# Patient Record
Sex: Male | Born: 1935 | Race: White | Hispanic: No | Marital: Married | Smoking: Never smoker
Health system: Southern US, Community
[De-identification: ages and names within clinical notes are randomized; demographics above are authoritative.]

## PROBLEM LIST (undated history)

## (undated) DIAGNOSIS — M4800 Spinal stenosis, site unspecified: Secondary | ICD-10-CM

## (undated) DIAGNOSIS — E119 Type 2 diabetes mellitus without complications: Secondary | ICD-10-CM

## (undated) DIAGNOSIS — E785 Hyperlipidemia, unspecified: Secondary | ICD-10-CM

## (undated) DIAGNOSIS — K649 Unspecified hemorrhoids: Secondary | ICD-10-CM

## (undated) DIAGNOSIS — K635 Polyp of colon: Secondary | ICD-10-CM

## (undated) DIAGNOSIS — I10 Essential (primary) hypertension: Secondary | ICD-10-CM

## (undated) DIAGNOSIS — N400 Enlarged prostate without lower urinary tract symptoms: Secondary | ICD-10-CM

## (undated) HISTORY — DX: Benign prostatic hyperplasia without lower urinary tract symptoms: N40.0

## (undated) HISTORY — DX: Hyperlipidemia, unspecified: E78.5

## (undated) HISTORY — DX: Unspecified hemorrhoids: K64.9

## (undated) HISTORY — DX: Type 2 diabetes mellitus without complications: E11.9

## (undated) HISTORY — DX: Polyp of colon: K63.5

## (undated) HISTORY — DX: Essential (primary) hypertension: I10

## (undated) HISTORY — DX: Spinal stenosis, site unspecified: M48.00

---
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
## 1964-06-25 HISTORY — PX: APPENDECTOMY: SHX54

## 2000-11-20 ENCOUNTER — Encounter (INDEPENDENT_AMBULATORY_CARE_PROVIDER_SITE_OTHER): Payer: Self-pay | Admitting: Specialist

## 2000-11-20 ENCOUNTER — Other Ambulatory Visit: Admission: RE | Admit: 2000-11-20 | Discharge: 2000-11-20 | Payer: Self-pay | Admitting: Internal Medicine

## 2005-04-12 ENCOUNTER — Ambulatory Visit: Payer: Self-pay | Admitting: Internal Medicine

## 2005-04-19 ENCOUNTER — Ambulatory Visit: Payer: Self-pay | Admitting: Internal Medicine

## 2005-05-07 ENCOUNTER — Ambulatory Visit: Payer: Self-pay

## 2005-05-16 ENCOUNTER — Ambulatory Visit: Payer: Self-pay | Admitting: Internal Medicine

## 2005-05-21 ENCOUNTER — Ambulatory Visit: Payer: Self-pay | Admitting: Internal Medicine

## 2005-12-18 ENCOUNTER — Ambulatory Visit: Payer: Self-pay | Admitting: Internal Medicine

## 2006-01-09 ENCOUNTER — Encounter (INDEPENDENT_AMBULATORY_CARE_PROVIDER_SITE_OTHER): Payer: Self-pay | Admitting: Specialist

## 2006-01-09 ENCOUNTER — Ambulatory Visit: Payer: Self-pay | Admitting: Internal Medicine

## 2006-04-10 ENCOUNTER — Ambulatory Visit: Payer: Self-pay | Admitting: Internal Medicine

## 2006-04-23 ENCOUNTER — Ambulatory Visit: Payer: Self-pay | Admitting: Internal Medicine

## 2006-04-23 LAB — CONVERTED CEMR LAB
ALT: 38 U/L
AST: 29 U/L
Albumin: 3.8 g/dL
Alkaline Phosphatase: 45 U/L
BUN: 20 mg/dL
Basophils Absolute: 0.1 K/uL
Basophils Relative: 0.8 %
Bilirubin Urine: NEGATIVE
CO2: 27 meq/L
Calcium: 9 mg/dL
Chloride: 102 meq/L
Chol/HDL Ratio, serum: 4.4
Cholesterol: 151 mg/dL
Creatinine, Ser: 1 mg/dL
Eosinophil percent: 3.2 %
GFR calc non Af Amer: 79 mL/min
Glomerular Filtration Rate, Af Am: 95 mL/min/1.73m2
Glucose, Bld: 227 mg/dL — ABNORMAL HIGH
HCT: 41.1 %
HDL: 34.7 mg/dL — ABNORMAL LOW
Hemoglobin, Urine: NEGATIVE
Hemoglobin: 14.2 g/dL
Ketones, ur: NEGATIVE mg/dL
LDL Cholesterol: 89 mg/dL
Leukocytes, UA: NEGATIVE
Lymphocytes Relative: 40.2 %
MCHC: 34.4 g/dL
MCV: 89 fL
Monocytes Absolute: 0.6 K/uL
Monocytes Relative: 6.3 %
Neutro Abs: 4.7 K/uL
Neutrophils Relative %: 49.5 %
Nitrite: NEGATIVE
PSA: 1.67 ng/mL
Platelets: 205 K/uL
Potassium: 4.1 meq/L
RBC: 4.62 M/uL
RDW: 11.9 %
Sodium: 136 meq/L
Specific Gravity, Urine: 1.025
TSH: 2.98 u[IU]/mL
Total Bilirubin: 0.7 mg/dL
Total Protein, Urine: NEGATIVE mg/dL
Total Protein: 7.1 g/dL
Triglyceride fasting, serum: 137 mg/dL
Urine Glucose: NEGATIVE mg/dL
Urobilinogen, UA: 0.2
VLDL: 27 mg/dL
WBC: 9.5 10*3/microliter
pH: 5

## 2006-04-30 ENCOUNTER — Ambulatory Visit: Payer: Self-pay | Admitting: Internal Medicine

## 2006-05-29 ENCOUNTER — Ambulatory Visit: Payer: Self-pay | Admitting: Internal Medicine

## 2006-06-20 ENCOUNTER — Ambulatory Visit: Payer: Self-pay | Admitting: Internal Medicine

## 2007-03-22 ENCOUNTER — Encounter: Payer: Self-pay | Admitting: *Deleted

## 2007-03-22 DIAGNOSIS — E119 Type 2 diabetes mellitus without complications: Secondary | ICD-10-CM

## 2007-03-22 DIAGNOSIS — E1159 Type 2 diabetes mellitus with other circulatory complications: Secondary | ICD-10-CM

## 2007-03-22 DIAGNOSIS — E785 Hyperlipidemia, unspecified: Secondary | ICD-10-CM

## 2007-03-22 DIAGNOSIS — Z87898 Personal history of other specified conditions: Secondary | ICD-10-CM

## 2007-03-22 DIAGNOSIS — I10 Essential (primary) hypertension: Secondary | ICD-10-CM

## 2007-04-30 ENCOUNTER — Ambulatory Visit: Payer: Self-pay | Admitting: Internal Medicine

## 2007-05-03 LAB — CONVERTED CEMR LAB
ALT: 22 units/L (ref 0–53)
Alkaline Phosphatase: 39 units/L (ref 39–117)
BUN: 17 mg/dL (ref 6–23)
Basophils Absolute: 0 10*3/uL (ref 0.0–0.1)
Basophils Relative: 0.3 % (ref 0.0–1.0)
Bilirubin Urine: NEGATIVE
Bilirubin, Direct: 0.1 mg/dL (ref 0.0–0.3)
CO2: 30 meq/L (ref 19–32)
Calcium: 9.1 mg/dL (ref 8.4–10.5)
Eosinophils Absolute: 0.3 10*3/uL (ref 0.0–0.6)
GFR calc Af Amer: 107 mL/min
GFR calc non Af Amer: 89 mL/min
HDL: 35.2 mg/dL — ABNORMAL LOW (ref >39.0)
Hemoglobin, Urine: NEGATIVE
Hemoglobin: 14 g/dL (ref 13.0–17.0)
Leukocytes, UA: NEGATIVE
Lymphocytes Relative: 30.1 % (ref 12.0–46.0)
MCHC: 34.2 g/dL (ref 30.0–36.0)
MCV: 90.2 fL (ref 78.0–100.0)
Monocytes Absolute: 0.5 10*3/uL (ref 0.2–0.7)
Monocytes Relative: 5.5 % (ref 3.0–11.0)
Neutro Abs: 6 10*3/uL (ref 1.4–7.7)
Platelets: 205 10*3/uL (ref 150–400)
Potassium: 4.6 meq/L (ref 3.5–5.1)
TSH: 2.42 microintl units/mL (ref 0.35–5.50)
Total CHOL/HDL Ratio: 3.5
Total Protein, Urine: NEGATIVE mg/dL
Total Protein: 6.9 g/dL (ref 6.0–8.3)
Triglycerides: 88 mg/dL (ref 0–149)
Urine Glucose: NEGATIVE mg/dL

## 2007-05-07 ENCOUNTER — Ambulatory Visit: Payer: Self-pay | Admitting: Internal Medicine

## 2007-05-08 LAB — CONVERTED CEMR LAB: Hgb A1c MFr Bld: 6.2 % — ABNORMAL HIGH (ref 4.6–6.0)

## 2008-06-29 ENCOUNTER — Ambulatory Visit: Payer: Self-pay | Admitting: Internal Medicine

## 2008-06-29 LAB — CONVERTED CEMR LAB
Albumin: 3.9 g/dL (ref 3.5–5.2)
Alkaline Phosphatase: 46 units/L (ref 39–117)
BUN: 16 mg/dL (ref 6–23)
Basophils Relative: 0.5 % (ref 0.0–3.0)
Calcium: 8.9 mg/dL (ref 8.4–10.5)
Cholesterol: 163 mg/dL (ref 0–200)
Creatinine, Ser: 1 mg/dL (ref 0.4–1.5)
Eosinophils Relative: 2.1 % (ref 0.0–5.0)
GFR calc Af Amer: 94 mL/min
Glucose, Bld: 121 mg/dL — ABNORMAL HIGH (ref 70–99)
HCT: 44 % (ref 39.0–52.0)
HDL: 41.2 mg/dL (ref >39.0)
Hemoglobin: 15.3 g/dL (ref 13.0–17.0)
MCV: 88.3 fL (ref 78.0–100.0)
Monocytes Absolute: 1.1 10*3/uL — ABNORMAL HIGH (ref 0.1–1.0)
Monocytes Relative: 7.6 % (ref 3.0–12.0)
Neutro Abs: 9.5 10*3/uL — ABNORMAL HIGH (ref 1.4–7.7)
Nitrite: NEGATIVE
PSA: 2.02 ng/mL (ref 0.10–4.00)
TSH: 4.07 microintl units/mL (ref 0.35–5.50)
Total Protein, Urine: NEGATIVE mg/dL
Total Protein: 7 g/dL (ref 6.0–8.3)
Urine Glucose: NEGATIVE mg/dL
WBC: 15 10*3/uL — ABNORMAL HIGH (ref 4.5–10.5)
pH: 6 (ref 5.0–8.0)

## 2008-07-01 ENCOUNTER — Encounter: Payer: Self-pay | Admitting: Internal Medicine

## 2008-07-06 ENCOUNTER — Ambulatory Visit: Payer: Self-pay | Admitting: Internal Medicine

## 2008-07-06 LAB — CONVERTED CEMR LAB: Hgb A1c MFr Bld: 6.5 % — ABNORMAL HIGH (ref 4.6–6.0)

## 2008-07-07 ENCOUNTER — Telehealth: Payer: Self-pay | Admitting: Internal Medicine

## 2009-07-04 ENCOUNTER — Ambulatory Visit: Payer: Self-pay | Admitting: Internal Medicine

## 2009-07-04 LAB — CONVERTED CEMR LAB
ALT: 22 units/L (ref 0–53)
AST: 22 units/L (ref 0–37)
Albumin: 3.9 g/dL (ref 3.5–5.2)
Alkaline Phosphatase: 34 units/L — ABNORMAL LOW (ref 39–117)
BUN: 15 mg/dL (ref 6–23)
Basophils Relative: 0.1 % (ref 0.0–3.0)
Bilirubin Urine: NEGATIVE
Chloride: 105 meq/L (ref 96–112)
Cholesterol: 109 mg/dL (ref 0–200)
Creatinine, Ser: 1.1 mg/dL (ref 0.4–1.5)
Hemoglobin, Urine: NEGATIVE
Hemoglobin: 14.5 g/dL (ref 13.0–17.0)
LDL Cholesterol: 52 mg/dL (ref 0–99)
Lymphocytes Relative: 37.5 % (ref 12.0–46.0)
MCHC: 33 g/dL (ref 30.0–36.0)
Monocytes Relative: 6.3 % (ref 3.0–12.0)
Neutro Abs: 5.7 10*3/uL (ref 1.4–7.7)
RBC: 4.83 M/uL (ref 4.22–5.81)
Total Protein, Urine: NEGATIVE mg/dL
Total Protein: 7.3 g/dL (ref 6.0–8.3)
Urine Glucose: NEGATIVE mg/dL

## 2009-07-08 ENCOUNTER — Encounter: Payer: Self-pay | Admitting: Internal Medicine

## 2009-07-11 ENCOUNTER — Ambulatory Visit: Payer: Self-pay | Admitting: Internal Medicine

## 2009-07-13 ENCOUNTER — Telehealth: Payer: Self-pay | Admitting: Internal Medicine

## 2009-07-21 ENCOUNTER — Encounter: Payer: Self-pay | Admitting: Internal Medicine

## 2009-07-21 ENCOUNTER — Telehealth: Payer: Self-pay | Admitting: Internal Medicine

## 2009-08-01 ENCOUNTER — Telehealth: Payer: Self-pay | Admitting: Internal Medicine

## 2009-08-04 ENCOUNTER — Encounter: Payer: Self-pay | Admitting: Internal Medicine

## 2009-11-29 ENCOUNTER — Telehealth: Payer: Self-pay | Admitting: Internal Medicine

## 2010-06-21 ENCOUNTER — Telehealth: Payer: Self-pay | Admitting: Internal Medicine

## 2010-07-12 ENCOUNTER — Encounter: Payer: Self-pay | Admitting: Internal Medicine

## 2010-07-19 ENCOUNTER — Ambulatory Visit: Admit: 2010-07-19 | Payer: Self-pay | Admitting: Internal Medicine

## 2010-07-25 NOTE — Letter (Signed)
Summary: [facility]   Imported By: Lester Monetta 07/13/2009 07:46:30  _____________________________________________________________________  External Attachment:    Type:   Image     Comment:   External Document

## 2010-07-25 NOTE — Progress Notes (Signed)
  Phone Note Refill Request Message from:  Fax from Pharmacy on November 29, 2009 4:38 PM  Refills Requested: Medication #1:  AMLODIPINE BESYLATE 10 MG TABS 1 by mouth once daily. Initial call taken by: Ami Bullins CMA,  November 29, 2009 4:38 PM    Prescriptions: AMLODIPINE BESYLATE 10 MG TABS (AMLODIPINE BESYLATE) 1 by mouth once daily  #30 x 6   Entered by:   Ami Bullins CMA   Authorized by:   Jacques Navy MD   Signed by:   Bill Salinas CMA on 11/29/2009   Method used:   Electronically to        [facility] (retail)       [address]       [place], [place]  [address]       Ph: [phone]       Fax: [phone]   RxID:   [id number]

## 2010-07-25 NOTE — Progress Notes (Signed)
Summary: RX's not rec'd  Phone Note Call from Patient Call back at Home Phone [phone]   Caller: Spouse Summary of Call: Patient spouse called stating that prescriptions have not been rec'd by [facility]. Re-sent them.  Patient notified. Initial call taken by: Lucious Groves,  July 13, 2009 3:10 PM    Prescriptions: GLIMEPIRIDE 2 MG  TABS (GLIMEPIRIDE) Take 1/2 tablet daily  #90 x 3   Entered by:   Lucious Groves   Authorized by:   Jacques Navy MD   Signed by:   Lucious Groves on 07/13/2009   Method used:   Faxed to ...       [facility]       [address] [place], [place]  [address]       Ph: [phone]       Fax: [phone]   RxID:   [id number] FUROSEMIDE 40 MG TABS (FUROSEMIDE) 1 by mouth once daily  #90 x 3   Entered by:   Lucious Groves   Authorized by:   Jacques Navy MD   Signed by:   Lucious Groves on 07/13/2009   Method used:   Faxed to ...       [facility] (mail[facility]order)       [address] [place], [place]  [address]       Ph: [phone]       Fax: [phone]   RxID:   [id number] LISINOPRIL 40 MG  TABS (LISINOPRIL) Take one tablet once daily  #90 x 3   Entered by:   Lucious Groves   Authorized by:   Jacques Navy MD   Signed by:   Lucious Groves on 07/13/2009   Method used:   Faxed to ...       [facility] (mail-order)       [address] [place], [place]  [address]       Ph: [phone]       Fax: [phone]   RxID:   [id number] METFORMIN HCL 1000 MG  TABS (METFORMIN HCL) Take one tablet twice daily  #180 x 3   Entered by:   Lucious Groves   Authorized by:   Jacques Navy MD   Signed by:   Lucious Groves on 07/13/2009   Method used:   Faxed to ...       [facility] (mail-order)       [address] [place], [place]  [address]       Ph: [phone]       Fax: [phone]   RxID:   [id number] SIMVASTATIN 20 MG  TABS (SIMVASTATIN) 1 by mouth once daily  #90 x 3   Entered by:   Lucious Groves   Authorized by:    Jacques Navy MD   Signed by:   Lucious Groves on 07/13/2009   Method used:   Faxed to ...       [facility] (mail[facility]order)       [address] [place], [place]  [address]       Ph: [phone]       Fax: [phone]   RxID:   [id number]

## 2010-07-25 NOTE — Letter (Signed)
Summary: BP/[facility]  [facility]   Imported By: Lester [place] 08/04/2009 10:53:46  _____________________________________________________________________  External Attachment:    Type:   Image     Comment:   External Document

## 2010-07-25 NOTE — Letter (Signed)
Summary: BP Log from pt/[facility]  BP Log from pt/[facility]   Imported By: Lester DeWitt 07/13/2009 07:47:30  _____________________________________________________________________  External Attachment:    Type:   Image     Comment:   External Document

## 2010-07-25 NOTE — Assessment & Plan Note (Signed)
Summary: PHYSICAL--PER WIFE--AETNA-D/T----STC--rs'd/cd   Vital Signs:  Patient profile:   77 year old male Height:      65 inches Weight:      187 pounds BMI:     31.23 O2 Sat:      95 % on Room air Temp:     97.4 degrees F oral Pulse rate:   101 / minute BP sitting:   172 / 92  (left arm) Cuff size:   large  Vitals Entered By: Bill Salinas CMA (July 11, 2009 2:21 PM)  O2 Flow:  Room air CC: pt here for cpx/ ab   Primary Care Provider:  Jacques Navy MD  CC:  pt here for cpx/ ab.  History of Present Illness: Patient presents for general medical follow-up and exam. He is feeling well.   He has been monitoring his BP at home and much to his surprise the BP is elvated in the 150-190 SBP/ 77-90 DBP range. He has had no symptoms. He has had good control of CBGs at home. He has had an eye exam with no diabetic retinopathy.   He reports good control of his diabetes via CBG monitoring at home. He does adhere to a good diet.   He is recovering from a cold he contracted from one of the grandchildren.   Current Medications (verified): 1)  Multivitamins   Tabs (Multiple Vitamin) .... Take One Tablet Once Daily 2)  B-50   Tabs (Vitamins-Lipotropics) .... Take One Tablet One Daily 3)  Vitamin E 400 Unit  Caps (Vitamin E) .... Take One Capsule Daily 4)  Vitamin C 500 Mg  Tabs (Ascorbic Acid) .... Take One Tablet Once Daily 5)  Simvastatin 20 Mg  Tabs (Simvastatin) .Marland Kitchen.. 1 By Mouth Once Daily 6)  Metformin Hcl 1000 Mg  Tabs (Metformin Hcl) .... Take One Tablet Twice Daily 7)  Lisinopril 40 Mg  Tabs (Lisinopril) .... Take One Tablet Once Daily 8)  Furosemide 40 Mg Tabs (Furosemide) .Marland Kitchen.. 1 By Mouth Once Daily 9)  Glimepiride 2 Mg  Tabs (Glimepiride) .... Take 1/2 Tablet Daily 10)  Vitamin D 1000 Unit  Tabs (Cholecalciferol) .Marland Kitchen.. 1 By Mouth Two Times A Day  Allergies (verified): No Known Drug Allergies  Past History:  Past Medical History: Last updated: 03/22/2007 Hx of POLYP,  COLON (ICD-211.3) HYPERTENSION (ICD-401.9) DYSPEPSIA,MILD (ICD-536.8) DIABETES MELLITUS (ICD-250.00) HYPERLIPIDEMIA (ICD-272.4) HEMORRHOIDS (ICD-455.6) BENIGN PROSTATIC HYPERTROPHY, HX OF (ICD-V13.8)     Past Surgical History: Last updated: 03/22/2007 * THORACOSTOMY AS AN INFANT APPENDECTOMY, HX OF (ICD-V45.79)    Family History: Last updated: 05/07/2007 PVD, DM, CHF, CAD brother died at age 51 in his sleep of MI  Social History: Last updated: 05/07/2007 married 1966 2 sons 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 supportive family-sons live nearby marriage is in good health  Risk Factors: Exercise: yes (05/07/2007)  Risk Factors: Smoking Status: quit (03/22/2007)  Review of Systems  The patient denies anorexia, fever, weight loss, weight gain, decreased hearing, hoarseness, chest pain, syncope, dyspnea on exertion, prolonged cough, headaches, abdominal pain, severe indigestion/heartburn, genital sores, muscle weakness, transient blindness, difficulty walking, unusual weight change, enlarged lymph nodes, and angioedema.    Physical Exam  General:  WNWD [place] male in no distress Head:  normocephalic, atraumatic, and no abnormalities observed.   Eyes:  vision grossly intact, pupils equal, pupils round, corneas and lenses clear, and no injection.   Ears:  R ear normal and L ear normal.   Nose:  no external  deformity and no external erythema.   Mouth:  Oral mucosa and oropharynx without lesions or exudates.  Teeth in good repair. Neck:  supple, full ROM, no thyromegaly, and no carotid bruits.   Chest Wall:  right posterior chest wall deformity from thoracotomy Lungs:  Normal respiratory effort, chest expands symmetrically. Lungs are clear to auscultation, no crackles or wheezes. Heart:  Normal rate and regular rhythm. S1 and S2 normal without gallop, murmur, click, rub or other extra sounds. Abdomen:  soft, non-tender, normal bowel sounds, no  distention, no masses, no abdominal hernia, and no hepatomegaly.   Rectal:  No external abnormalities noted. Normal sphincter tone. No rectal masses or tenderness. Prostate:  Prostate gland firm and smooth, no enlargement, nodularity, tenderness, mass, asymmetry or induration. Msk:  normal ROM, no joint tenderness, no joint swelling, no joint warmth, and no redness over joints.   Pulses:  2+ radial and DP pulses Extremities:  No clubbing, cyanosis, edema, or deformity noted with normal full range of motion of all joints.   Neurologic:  No cranial nerve deficits noted. Station and gait are normal. Plantar reflexes are down-going bilaterally. DTRs are symmetrical throughout. Sensory, motor and coordinative functions appear intact. Skin:  Vitiligo. No suspicious lesions noted Cervical Nodes:  no anterior cervical adenopathy and no posterior cervical adenopathy.   Inguinal Nodes:  no R inguinal adenopathy and no L inguinal adenopathy.   Psych:  Oriented X3, memory intact for recent and remote, normally interactive, and good eye contact.    Diabetes Management Exam:    Foot Exam (with socks and/or shoes not present):       Sensory-Pinprick/Light touch:          Right medial foot (L-4): normal          Right dorsal foot (L-5): normal          Right lateral foot (S-1): normal       Sensory-other: normal deep vibratory sensation    Eye Exam:       Eye Exam done elsewhere          Date: 07/08/2009          Results: normal          Done by: [facility]   Impression & Recommendations:  Problem # 1:  HYPERTENSION (ICD-401.9)  His updated medication list for this problem includes:    Lisinopril 40 Mg Tabs (Lisinopril) .Marland Kitchen... Take one tablet once daily    Furosemide 40 Mg Tabs (Furosemide) .Marland Kitchen... 1 by mouth once daily  BP today: 172/92 Prior BP: 180/80 (07/06/2008) Suboptimal control base on todays readings and home. Presumed white coat hypertension is true hypertension not  controlled.  Plan increase furosemide to full dose at 40mg  daily.         If not controlled will add CCB  Problem # 2:  DIABETES MELLITUS (ICD-250.00)  His updated medication list for this problem includes:    Metformin Hcl 1000 Mg Tabs (Metformin hcl) .Marland Kitchen... Take one tablet twice daily    Lisinopril 40 Mg Tabs (Lisinopril) .Marland Kitchen... Take one tablet once daily    Glimepiride 2 Mg Tabs (Glimepiride) .Marland Kitchen... Take 1/2 tablet daily  Labs Reviewed: Creat: 1.1 (07/04/2009)    Reviewed HgBA1c results: 6.6 (07/04/2009)  6.5 (07/06/2008)  Good control. Plan is to continue present medications.  Problem # 3:  HYPERLIPIDEMIA (ICD-272.4)  His updated medication list for this problem includes:    Simvastatin 20 Mg Tabs (Simvastatin) .Marland Kitchen... 1 by mouth  once daily  Labs Reviewed: SGOT: 22 (07/04/2009)   SGPT: 22 (07/04/2009)   HDL:40.00 (07/04/2009), 41.2 (06/29/2008)  LDL:52 (07/04/2009), 95 (06/29/2008)  Chol:109 (07/04/2009), 163 (06/29/2008)  Trig:87.0 (07/04/2009), 133 (06/29/2008)  Excellent control and definitely at goal.  Plan - continue present medications.  Problem # 4:  Preventive Health Care (ICD-V70.0) Normal exam. He is current with his eye doctor - report brought today. He is current with colorectal cancer screening. Immuniations are current. Normal prostate cancer screen with PSA within normal limits by absolute value and acceration.  In summary - a very nice man who is medically stable except for suboptimal control of BP. He will send me BP readings in 2 weeks and further adjustments in medications will be made as needed.   Complete Medication List: 1)  Multivitamins Tabs (Multiple vitamin) .... Take one tablet once daily 2)  B-50 Tabs (Vitamins-lipotropics) .... Take one tablet one daily 3)  Vitamin E 400 Unit Caps (Vitamin e) .... Take one capsule daily 4)  Vitamin C 500 Mg Tabs (Ascorbic acid) .... Take one tablet once daily 5)  Simvastatin 20 Mg Tabs (Simvastatin) .Marland Kitchen.. 1 by  mouth once daily 6)  Metformin Hcl 1000 Mg Tabs (Metformin hcl) .... Take one tablet twice daily 7)  Lisinopril 40 Mg Tabs (Lisinopril) .... Take one tablet once daily 8)  Furosemide 40 Mg Tabs (Furosemide) .Marland Kitchen.. 1 by mouth once daily 9)  Glimepiride 2 Mg Tabs (Glimepiride) .... Take 1/2 tablet daily 10)  Vitamin D 1000 Unit Tabs (Cholecalciferol) .Marland Kitchen.. 1 by mouth two times a day  Patient: Brandon Santos Note: All result statuses are Final unless otherwise noted.  Tests: (1) BMP (METABOL)   Sodium                    142 mEq/L                   135-145   Potassium                 4.8 mEq/L                   3.5-5.1   Chloride                  105 mEq/L                   96-112   Carbon Dioxide            30 mEq/L                    19-32   Glucose                   97 mg/dL                    52-84   BUN                       15 mg/dL                    1-32   Creatinine                1.1 mg/dL                   4.4-0.1   Calcium                   9.2  mg/dL                   4.0-98.1   GFR                       69.72 mL/min                >60  Tests: (2) Lipid Panel (LIPID)   Cholesterol               109 mg/dL                   1-914     ATP III Classification            Desirable:  < 200 mg/dL                    Borderline High:  200 - 239 mg/dL               High:  > = 240 mg/dL   Triglycerides             87.0 mg/dL                  7.8-295.6     Normal:  <150 mg/dL     Borderline High:  213 - 199 mg/dL   HDL                       08.65 mg/dL                 >78.46   VLDL Cholesterol          17.4 mg/dL                  9.6-29.5   LDL Cholesterol           52 mg/dL                    2-84  CHO/HDL Ratio:  CHD Risk                             3                    Men          Women     1/2 Average Risk     3.4          3.3     Average Risk          5.0          4.4     2X Average Risk          9.6          7.1     3X Average Risk          15.0          11.0                            Tests: (3) CBC Platelet w/Diff (CBCD)   White Cell Count     [H]  10.8 K/uL                   4.5-10.5   Red Cell Count            4.83 Mil/uL  4.22-5.81   Hemoglobin                14.5 g/dL                   29.5-62.1   Hematocrit                43.9 %                      39.0-52.0   MCV                       90.9 fl                     78.0-100.0   MCHC                      33.0 g/dL                   30.8-65.7   RDW                       11.9 %                      11.5-14.6   Platelet Count            204.0 K/uL                  150.0-400.0   Neutrophil %              53.4 %                      43.0-77.0   Lymphocyte %              37.5 %                      12.0-46.0   Monocyte %                6.3 %                       3.0-12.0   Eosinophils%              2.7 %                       0.0-5.0   Basophils %               0.1 %                       0.0-3.0   Neutrophill Absolute      5.7 K/uL                    1.4-7.7   Lymphocyte Absolute  [H]  4.1 K/uL                    0.7-4.0   Monocyte Absolute         0.7 K/uL                    0.1-1.0  Eosinophils, Absolute                             0.3 K/uL  0.0-0.7   Basophils Absolute        0.0 K/uL                    0.0-0.1  Tests: (4) Hepatic/Liver Function Panel (HEPATIC)   Total Bilirubin           0.8 mg/dL                   5.7-8.4   Direct Bilirubin          0.2 mg/dL                   6.9-6.2   Alkaline Phosphatase [L]  34 U/L                      39-117   AST                       22 U/L                      0-37   ALT                       22 U/L                      0-53   Total Protein             7.3 g/dL                    9.5-2.8   Albumin                   3.9 g/dL                    4.1-3.2  Tests: (5) TSH (TSH)   FastTSH                   3.39 uIU/mL                 0.35-5.50  Tests: (6) Prostate Specific Antigen (PSA)   PSA-Hyb                   2.45 ng/mL                   0.10-4.00  Tests: (7) UDip Only (UDIP)   Color                     DK. YELLOW       RANGE:  Yellow;Lt. Yellow   Clarity                   CLEAR                       Clear   Specific Gravity          1.020                       1.000 - 1.030   Urine Ph                  5.0                         5.0-8.0   Protein  NEGATIVE                    Negative   Urine Glucose             NEGATIVE                    Negative   Ketones                   NEGATIVE                    Negative   Urine Bilirubin           NEGATIVE                    Negative   Blood                     NEGATIVE                    Negative   Urobilinogen              0.2                         0.0 - 1.0   Leukocyte Esterace        NEGATIVE                    Negative   Nitrite                   NEGATIVE                    Negative  Tests: (1) Hemoglobin A1C (A1C)   Hemoglobin A1C       [H]  6.6 %                       4.6-6.5  Not Administered:    Influenza Vaccine not given due to: declined

## 2010-07-25 NOTE — Progress Notes (Signed)
  Phone Note Call from Patient   Summary of Call: received e-mail with BP readings: still running high up to 176/89. Called pt. He is to add norvasc. [facility] Initial call taken by: Jacques Navy MD,  July 21, 2009 9:40 AM    New/Updated Medications: AMLODIPINE BESYLATE 5 MG TABS (AMLODIPINE BESYLATE) 1 by mouth once daily Prescriptions: AMLODIPINE BESYLATE 5 MG TABS (AMLODIPINE BESYLATE) 1 by mouth once daily  #30 x 1   Entered and Authorized by:   Jacques Navy MD   Signed by:   Jacques Navy MD on 07/21/2009   Method used:   Electronically to        [facility] (retail)       [address]       [place], [place]  [address]       Ph: [phone]       Fax: [phone]   RxID:   [id number]

## 2010-07-25 NOTE — Progress Notes (Signed)
Summary: BP READINGS  Phone Note Call from Patient   Summary of Call: Pt emailed bp reading, please advise.  Initial call taken by: Lamar Sprinkles, CMA,  August 01, 2009 11:02 AM  Follow-up for Phone Call        reviewed BP readings e-mailed to me: systolics running a little high.  Plan - increase amlodipine to 10mg  daily. May call in new Rx Follow-up by: Jacques Navy MD,  August 03, 2009 9:12 AM  Additional Follow-up for Phone Call Additional follow up Details #1::        Notified. RX sent per patient request. Additional Follow-up by: Lucious Groves,  August 03, 2009 11:09 AM    New/Updated Medications: AMLODIPINE BESYLATE 10 MG TABS (AMLODIPINE BESYLATE) 1 by mouth once daily Prescriptions: AMLODIPINE BESYLATE 10 MG TABS (AMLODIPINE BESYLATE) 1 by mouth once daily  #30 x 3   Entered by:   Lucious Groves   Authorized by:   Jacques Navy MD   Signed by:   Lucious Groves on 08/03/2009   Method used:   Electronically to        [facility] (retail)       [address]       [place], [place]  [address]       Ph: [phone]       Fax: [phone]   RxID:   [id number]

## 2010-07-25 NOTE — Progress Notes (Signed)
Summary: BP  Phone Note Call from Patient   Summary of Call: Pt emailed Dr Debby Bud with his recent BP readings and would like feedback from Dr.  Initial call taken by: Lamar Sprinkles, CMA,  July 21, 2009 8:12 AM  Follow-up for Phone Call        done Follow-up by: Jacques Navy MD,  July 21, 2009 10:07 AM

## 2010-07-25 NOTE — Letter (Signed)
Summary: BPs from pt  BPs from pt   Imported By: Lester [place] 07/25/2009 09:24:37  _____________________________________________________________________  External Attachment:    Type:   Image     Comment:   External Document

## 2010-07-26 ENCOUNTER — Other Ambulatory Visit: Payer: Self-pay | Admitting: Internal Medicine

## 2010-07-26 ENCOUNTER — Other Ambulatory Visit: Payer: Self-pay

## 2010-07-26 ENCOUNTER — Encounter (INDEPENDENT_AMBULATORY_CARE_PROVIDER_SITE_OTHER): Payer: Self-pay | Admitting: *Deleted

## 2010-07-26 DIAGNOSIS — E119 Type 2 diabetes mellitus without complications: Secondary | ICD-10-CM

## 2010-07-26 DIAGNOSIS — Z0000 Encounter for general adult medical examination without abnormal findings: Secondary | ICD-10-CM

## 2010-07-26 LAB — CBC WITH DIFFERENTIAL/PLATELET
Basophils Absolute: 0.1 10*3/uL (ref 0.0–0.1)
Basophils Relative: 0.5 % (ref 0.0–3.0)
Eosinophils Absolute: 0.4 10*3/uL (ref 0.0–0.7)
Eosinophils Relative: 3.7 % (ref 0.0–5.0)
Lymphs Abs: 4.2 10*3/uL — ABNORMAL HIGH (ref 0.7–4.0)
MCV: 91.1 fl (ref 78.0–100.0)
Neutro Abs: 6.4 10*3/uL (ref 1.4–7.7)
Neutrophils Relative %: 54.9 % (ref 43.0–77.0)
Platelets: 194 10*3/uL (ref 150.0–400.0)
WBC: 11.6 10*3/uL — ABNORMAL HIGH (ref 4.5–10.5)

## 2010-07-26 LAB — URINALYSIS
Bilirubin Urine: NEGATIVE
Hgb urine dipstick: NEGATIVE
Leukocytes, UA: NEGATIVE
Specific Gravity, Urine: 1.02 (ref 1.000–1.030)
Urine Glucose: NEGATIVE
Urobilinogen, UA: 0.2 (ref 0.0–1.0)

## 2010-07-26 LAB — BASIC METABOLIC PANEL
BUN: 20 mg/dL (ref 6–23)
Calcium: 9.6 mg/dL (ref 8.4–10.5)
Chloride: 105 mEq/L (ref 96–112)
Creatinine, Ser: 1.1 mg/dL (ref 0.4–1.5)
GFR: 71.77 mL/min (ref >60.00)

## 2010-07-26 LAB — HEPATIC FUNCTION PANEL
AST: 19 U/L (ref 0–37)
Albumin: 4.1 g/dL (ref 3.5–5.2)
Alkaline Phosphatase: 44 U/L (ref 39–117)
Total Protein: 7.1 g/dL (ref 6.0–8.3)

## 2010-07-26 LAB — LIPID PANEL
Cholesterol: 121 mg/dL (ref 0–200)
HDL: 39.5 mg/dL (ref >39.00)
LDL Cholesterol: 56 mg/dL (ref 0–99)
Triglycerides: 126 mg/dL (ref 0.0–149.0)
VLDL: 25.2 mg/dL (ref 0.0–40.0)

## 2010-07-26 LAB — TSH: TSH: 3.67 u[IU]/mL (ref 0.35–5.50)

## 2010-07-27 NOTE — Progress Notes (Signed)
Summary: Rx refill req  Phone Note Refill Request Message from:  Patient on June 21, 2010 10:37 AM  Refills Requested: Medication #1:  SIMVASTATIN 20 MG  TABS 1 by mouth once daily   Dosage confirmed as above?Dosage Confirmed  Medication #2:  METFORMIN HCL 1000 MG  TABS Take one tablet twice daily   Dosage confirmed as above?Dosage Confirmed  Medication #3:  LISINOPRIL 40 MG  TABS Take one tablet once daily   Dosage confirmed as above?Dosage Confirmed  Medication #4:  FUROSEMIDE 40 MG TABS 1 by mouth once daily   Dosage confirmed as above?Dosage Confirmed And Glimeparide 2mg    Method Requested: Fax to [facility] Initial call taken by: Margaret Pyle, CMA,  June 21, 2010 10:38 AM    Prescriptions: GLIMEPIRIDE 2 MG  TABS (GLIMEPIRIDE) Take 1/2 tablet daily  #90 x 0   Entered by:   Margaret Pyle, CMA   Authorized by:   Jacques Navy MD   Signed by:   Margaret Pyle, CMA on 06/21/2010   Method used:   Faxed to ...       [facility] ([facility]       [address] [place], [place]  [address]       Ph: [phone]       Fax: [phone]   RxID:   [id number] FUROSEMIDE 40 MG TABS (FUROSEMIDE) 1 by mouth once daily  #90 x 0   Entered by:   Margaret Pyle, CMA   Authorized by:   Jacques Navy MD   Signed by:   Margaret Pyle, CMA on 06/21/2010   Method used:   Faxed to ...       [facility] (mail-order)       [address] [place], [place]  [address]       Ph: [phone]       Fax: [phone]   RxID:   [id number] LISINOPRIL 40 MG  TABS (LISINOPRIL) Take one tablet once daily  #90 x 0   Entered by:   Margaret Pyle, CMA   Authorized by:   Jacques Navy MD   Signed by:   Margaret Pyle, CMA on 06/21/2010   Method used:   Faxed to ...       [facility] (mail-order)       [address] [place], [place]  [address]       Ph: [phone]       Fax: [phone]   RxID:    [id number] METFORMIN HCL 1000 MG  TABS (METFORMIN HCL) Take one tablet twice daily  #180 x 0   Entered by:   Margaret Pyle, CMA   Authorized by:   Jacques Navy MD   Signed by:   Margaret Pyle, CMA on 06/21/2010   Method used:   Faxed to ...       [facility] (mail-order)       [address] [place], [place]  [address]       Ph: [phone]       Fax: [phone]   RxID:   [id number] SIMVASTATIN 20 MG  TABS (SIMVASTATIN) 1 by mouth once daily  #90 x 0   Entered by:   Margaret Pyle, CMA   Authorized by:   Jacques Navy MD   Signed by:   Margaret Pyle, CMA on 06/21/2010  Method used:   Faxed to ...       [facility] (mail-order)       [address] [place], [place]  [address]       Ph: [phone]       Fax: [phone]   RxID:   [id number]

## 2010-08-01 ENCOUNTER — Encounter (INDEPENDENT_AMBULATORY_CARE_PROVIDER_SITE_OTHER): Payer: Managed Care, Other (non HMO) | Admitting: Internal Medicine

## 2010-08-01 ENCOUNTER — Encounter: Payer: Self-pay | Admitting: Internal Medicine

## 2010-08-01 DIAGNOSIS — I10 Essential (primary) hypertension: Secondary | ICD-10-CM

## 2010-08-01 DIAGNOSIS — Z0000 Encounter for general adult medical examination without abnormal findings: Secondary | ICD-10-CM

## 2010-08-01 DIAGNOSIS — Z23 Encounter for immunization: Secondary | ICD-10-CM

## 2010-08-01 DIAGNOSIS — E785 Hyperlipidemia, unspecified: Secondary | ICD-10-CM

## 2010-08-01 DIAGNOSIS — E119 Type 2 diabetes mellitus without complications: Secondary | ICD-10-CM

## 2010-08-10 NOTE — Assessment & Plan Note (Signed)
Summary: cpx aetna stc   Vital Signs:  Patient profile:   77 year old male Height:      65 inches Weight:      183 pounds BMI:     30.56 O2 Sat:      99 % on Room air Temp:     98.0 degrees F oral Pulse rate:   84 / minute BP sitting:   138 / 66  (left arm) Cuff size:   large  Vitals Entered By: Bill Salinas CMA (August 01, 2010 10:17 AM)  O2 Flow:  Room air CC: cpx/ab  Vision Screening:      Vision Comments: 07/12/2010 eye exam   Primary Care Provider:  Jacques Navy MD  CC:  cpx/ab.  History of Present Illness: Brandon Santos presents for an annual wellness exam. He has had a good year without illness, injury or surgery., He is feeling well. He has no problems with any of his medications.   In he is 100% independent in all ADLs. He has had no falls and has no fall. He is cognitively intact: learning to play the piano, he is reading a lot, he manages all the household finances and is very interactive. No signs of symptoms of depression.   He reports that with walking he will develop hip pain bilaterally that resolves within 20 seconds of rest. He has no pain on the bike or at rest or at other times. He is able to walk 100+ yards without having to rest.   Preventive Screening-Counseling & Management  Alcohol-Tobacco     Alcohol drinks/day: <1     Alcohol type: wine     Smoking Status: quit     Year Quit: 1986     Pack years: 22 pk years  Caffeine-Diet-Exercise     Caffeine use/day: none     Diet Comments: heart healthy -vegetarian     Does Patient Exercise: yes     Type of exercise: stationary bike     Exercise (avg: min/session): <30     Times/week: 6  Hep-HIV-STD-Contraception     Hepatitis Risk: no risk noted     HIV Risk: no risk noted     STD Risk: no risk noted     Dental Visit-last 6 months yes     TSE monthly: no     Sun Exposure-Excessive: no  Safety-Violence-Falls     Seat Belt Use: yes     Helmet Use: n/a     Firearms in the Home: no  firearms in the home     Smoke Detectors: yes     Violence in the Home: no risk noted     Sexual Abuse: no     Fall Risk: none      Sexual History:  currently monogamous.        Drug Use:  never.        Blood Transfusions:  no.    Current Medications (verified): 1)  Multivitamins   Tabs (Multiple Vitamin) .... Take One Tablet Once Daily 2)  B-50   Tabs (Vitamins-Lipotropics) .... Take One Tablet One Daily 3)  Vitamin E 400 Unit  Caps (Vitamin E) .... Take One Capsule Daily 4)  Vitamin C 500 Mg  Tabs (Ascorbic Acid) .... Take One Tablet Once Daily 5)  Simvastatin 20 Mg  Tabs (Simvastatin) .Marland Kitchen.. 1 By Mouth Once Daily 6)  Metformin Hcl 1000 Mg  Tabs (Metformin Hcl) .... Take One Tablet Twice Daily 7)  Lisinopril 40  Mg  Tabs (Lisinopril) .... Take One Tablet Once Daily 8)  Furosemide 40 Mg Tabs (Furosemide) .Marland Kitchen.. 1 By Mouth Once Daily 9)  Glimepiride 2 Mg  Tabs (Glimepiride) .... Take 1/2 Tablet Daily 10)  Vitamin D 1000 Unit  Tabs (Cholecalciferol) .Marland Kitchen.. 1 Once Daily 11)  Amlodipine Besylate 10 Mg Tabs (Amlodipine Besylate) .Marland Kitchen.. 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 supportive family-sons live nearby Music and literature lover: reads a lot , is learning the keyboard. marriage is in good health- SO with diagnosis of breast cancer '11 - she is doing well.Caffeine use/day:  none Hepatitis Risk:  no risk noted HIV Risk:  no risk noted STD Risk:  no risk noted Dental Care w/in 6 mos.:  yes Sun Exposure-Excessive:  no Seat Belt Use:  yes Fall Risk:  none Sexual History:  currently monogamous Drug Use:  never Blood Transfusions:  no  Review of Systems  The patient denies anorexia, fever, weight loss, weight gain, vision loss, decreased hearing, chest pain, syncope, dyspnea on exertion, peripheral edema, headaches, hemoptysis, abdominal pain, severe  indigestion/heartburn, hematuria, incontinence, muscle weakness, suspicious skin lesions, transient blindness, difficulty walking, depression, abnormal bleeding, enlarged lymph nodes, angioedema, and testicular masses.    Physical Exam  General:  alert, well-developed, well-nourished, well-hydrated, and normal appearance.   Head:  Normocephalic and atraumatic without obvious abnormalities. No apparent alopecia or balding. Eyes:  No corneal or conjunctival inflammation noted. EOMI. Perrla. Funduscopic exam benign, without hemorrhages, exudates or papilledema. Vision grossly normal. Ears:  External ear exam shows no significant lesions or deformities.  Otoscopic examination reveals clear canals, tympanic membranes are intact bilaterally without bulging, retraction, inflammation or discharge. Hearing is grossly normal bilaterally. Nose:  no external deformity and no external erythema.   Mouth:  Oral mucosa and oropharynx without lesions or exudates.  Teeth in good repair. Neck:  supple, full ROM, no thyromegaly, and no carotid bruits.   Chest Wall:  No deformities, masses, tenderness or gynecomastia noted. Lungs:  Normal respiratory effort, chest expands symmetrically. Lungs are clear to auscultation, no crackles or wheezes. Heart:  normal rate, regular rhythm with frequent premature beats, no murmur, no JVD, and no HJR.   Abdomen:  soft, non-tender, normal bowel sounds, no guarding, no rigidity, and no inguinal hernia.   Rectal:  deferred Prostate:  deferred to age Msk:  normal ROM, no joint tenderness, no joint swelling, no joint warmth, no redness over joints, and no joint deformities.   Pulses:  2+ radial, posterior tibial and dorsalis pedis pulses Extremities:  No clubbing, cyanosis, edema, or deformity noted with normal full range of motion of all joints.   Neurologic:  cranial nerves II-XII intact, gait normal, and DTRs symmetrical and normal.   Skin:  vitaligo - fairly extensive, no lesions  or ulcerations, no suspicious moles. Cervical Nodes:  no anterior cervical adenopathy and no posterior cervical adenopathy.   Psych:  Oriented X3, normally interactive, good eye contact, and not anxious appearing.     Impression & Recommendations:  Problem # 1:  HYPERTENSION (ICD-401.9)  His updated medication list for this problem includes:    Lisinopril 40 Mg Tabs (Lisinopril) .Marland Kitchen... Take one tablet once daily    Furosemide 40 Mg Tabs (Furosemide) .Marland Kitchen... 1 by mouth once daily    Amlodipine Besylate 10 Mg Tabs (Amlodipine besylate) .Marland Kitchen... 1 by mouth once daily  BP today:  138/66 Prior BP: 172/92 (07/11/2009)  Labs Reviewed: K+: 5.3 (07/26/2010) Creat: : 1.1 (07/26/2010)  Good control on present medications. Renal function and electrolytes are normal  Plan - continue present regimen  Problem # 2:  DIABETES MELLITUS (ICD-250.00) His updated medication list for this problem includes:    Metformin Hcl 1000 Mg Tabs (Metformin hcl) .Marland Kitchen... Take one tablet twice daily    Lisinopril 40 Mg Tabs (Lisinopril) .Marland Kitchen... Take one tablet once daily    Glimepiride 2 Mg Tabs (Glimepiride) .Marland Kitchen... Take 1/2 tablet daily  Labs Reviewed: Creat: 1.1 (07/26/2010)     Last Eye Exam: normal (07/08/2009) Reviewed HgBA1c results: 6.3 (07/26/2010)  6.6 (07/04/2009)  Excellent A1C reflecting good control on present regimen of diet plus medications. Current with eye exam.  Plan - continue present regimen   Problem # 3:  HYPERLIPIDEMIA (ICD-272.4)  His updated medication list for this problem includes:    Simvastatin 20 Mg Tabs (Simvastatin) .Marland Kitchen... 1 by mouth once daily  Labs Reviewed: SGOT: 19 (07/26/2010)   SGPT: 20 (07/26/2010)   HDL:39.50 (07/26/2010), 40.00 (07/04/2009)  LDL:56 (07/26/2010), 52 (07/04/2009)  Chol:121 (07/26/2010), 109 (07/04/2009)  Trig:126.0 (07/26/2010), 87.0 (07/04/2009)  Excellent control on low dose simvastatin  Plan - continue the same  Problem # 4:  BENIGN PROSTATIC  HYPERTROPHY, HX OF (ICD-V13.8) Asymptomatic.  Problem # 5:  Preventive Health Care (ICD-V70.0) Interval history is benign. Physical exam is normal. Lab results are excellent. He is current with colorectal cancer screening with last study Juy '07. Last PSA Jan '11 2.45 - discussed recent USPHCTF recommendations - and with recent normal lab and no symptoms will not screen this year. Immunizations: tetnus today; Pneumonia vaccine Nov '08; shingles recommended and he will check insurance coverage. 12 Lead EKG with right bundle branch block (explained in detail with cartoon) and no signs of ischemia or injury.  In summary - a delightful gentleman who is medically stable and doing well. He will return as needed or in 1 year.  Complete Medication List: 1)  Multivitamins Tabs (Multiple vitamin) .... Take one tablet once daily 2)  B-50 Tabs (Vitamins-lipotropics) .... Take one tablet one daily 3)  Vitamin E 400 Unit Caps (Vitamin e) .... Take one capsule daily 4)  Vitamin C 500 Mg Tabs (Ascorbic acid) .... Take one tablet once daily 5)  Simvastatin 20 Mg Tabs (Simvastatin) .Marland Kitchen.. 1 by mouth once daily 6)  Metformin Hcl 1000 Mg Tabs (Metformin hcl) .... Take one tablet twice daily 7)  Lisinopril 40 Mg Tabs (Lisinopril) .... Take one tablet once daily 8)  Furosemide 40 Mg Tabs (Furosemide) .Marland Kitchen.. 1 by mouth once daily 9)  Glimepiride 2 Mg Tabs (Glimepiride) .... Take 1/2 tablet daily 10)  Vitamin D 1000 Unit Tabs (Cholecalciferol) .Marland Kitchen.. 1 once daily 11)  Amlodipine Besylate 10 Mg Tabs (Amlodipine besylate) .Marland Kitchen.. 1 by mouth once daily  Other Orders: Tdap => 24yrs IM (16109) Admin 1st Vaccine (60454) Medicare -1st Annual Wellness Visit [phone])  Patient: Brandon Santos Note: All result statuses are Final unless otherwise noted.  Tests: (1) Lipid Panel (LIPID)   Cholesterol               121 mg/dL                   9-147     ATP III Classification            Desirable:  < 200 mg/dL  Borderline High:  200 - 239 mg/dL               High:  > = 240 mg/dL   Triglycerides             126.0 mg/dL                 3.0-865.7     Normal:  <150 mg/dL     Borderline High:  846 - 199 mg/dL   HDL                       96.29 mg/dL                 >52.84   VLDL Cholesterol          25.2 mg/dL                  1.3-24.4   LDL Cholesterol           56 mg/dL                    0-10  CHO/HDL Ratio:  CHD Risk                             3                    Men          Women     1/2 Average Risk     3.4          3.3     Average Risk          5.0          4.4     2X Average Risk          9.6          7.1     3X Average Risk          15.0          11.0                           Tests: (2) BMP (METABOL)   Sodium                    141 mEq/L                   135-145   Potassium            [H]  5.3 mEq/L                   3.5-5.1   Chloride                  105 mEq/L                   96-112   Carbon Dioxide            29 mEq/L                    19-32   Glucose                   90 mg/dL                    27-25   BUN  20 mg/dL                    1-61   Creatinine                1.1 mg/dL                   0.9-6.0   Calcium                   9.6 mg/dL                   4.5-40.9   GFR                       71.77 mL/min                >60.00  Tests: (3) CBC Platelet w/Diff (CBCD)   White Cell Count     [H]  11.6 K/uL                   4.5-10.5   Red Cell Count            4.41 Mil/uL                 4.22-5.81   Hemoglobin                13.5 g/dL                   81.1-91.4   Hematocrit                40.2 %                      39.0-52.0   MCV                       91.1 fl                     78.0-100.0   MCHC                      33.7 g/dL                   78.2-95.6   RDW                       13.7 %                      11.5-14.6   Platelet Count            194.0 K/uL                  150.0-400.0   Neutrophil %              54.9 %                       43.0-77.0   Lymphocyte %              35.8 %                      12.0-46.0   Monocyte %                5.1 %  3.0-12.0   Eosinophils%              3.7 %                       0.0-5.0   Basophils %               0.5 %                       0.0-3.0   Neutrophill Absolute      6.4 K/uL                    1.4-7.7   Lymphocyte Absolute  [H]  4.2 K/uL                    0.7-4.0   Monocyte Absolute         0.6 K/uL                    0.1-1.0  Eosinophils, Absolute                             0.4 K/uL                    0.0-0.7   Basophils Absolute        0.1 K/uL                    0.0-0.1  Tests: (4) Hepatic/Liver Function Panel (HEPATIC)   Total Bilirubin           0.5 mg/dL                   0.2-7.2   Direct Bilirubin          0.1 mg/dL                   5.3-6.6   Alkaline Phosphatase      44 U/L                      39-117   AST                       19 U/L                      0-37   ALT                       20 U/L                      0-53   Total Protein             7.1 g/dL                    4.4-0.3   Albumin                   4.1 g/dL                    4.7-4.2  Tests: (5) TSH (TSH)   FastTSH                   3.67 uIU/mL                 0.35-5.50  Tests: (  6) UDip Only (UDIP)   Color                     YELLOW       RANGE:  Yellow;Lt. Yellow   Clarity                   CLEAR                       Clear   Specific Gravity          1.020                       1.000 - 1.030   Urine Ph                  5.5                         5.0-8.0   Protein                   NEGATIVE                    Negative   Urine Glucose             NEGATIVE                    Negative   Ketones                   TRACE                       Negative   Urine Bilirubin           NEGATIVE                    Negative   Blood                     NEGATIVE                    Negative   Urobilinogen              0.2                         0.0 - 1.0   Leukocyte Esterace        NEGATIVE                     Negative   Nitrite                   NEGATIVE                    Negative  Tests: (1) Hemoglobin A1C (A1C)   Hemoglobin A1C            6.3 %                       4.6-6.5  Orders Added: 1)  Tdap => 2yrs IM [90715] 2)  Admin 1st Vaccine [90471] 3)  Medicare -1st Annual Wellness Visit [G0438] 4)  Est. Patient Level III [16109]   Immunizations Administered:  Tetanus Vaccine:    Vaccine Type: Tdap    Site: right deltoid    Mfr: GlaxoSmithKline    Dose: 0.5 ml  Route: IM    Given by: Ami Bullins CMA    Exp. Date: 04/13/2012    Lot #: [id number]    VIS given: 05/12/08 version given August 01, 2010.   Immunizations Administered:  Tetanus Vaccine:    Vaccine Type: Tdap    Site: right deltoid    Mfr: GlaxoSmithKline    Dose: 0.5 ml    Route: IM    Given by: Ami Bullins CMA    Exp. Date: 04/13/2012    Lot #: [id number]    VIS given: 05/12/08 version given August 01, 2010.

## 2010-08-16 NOTE — Letter (Signed)
Summary: [facility]   Imported By: Lennie Odor 08/09/2010 14:56:39  _____________________________________________________________________  External Attachment:    Type:   Image     Comment:   External Document

## 2010-08-17 ENCOUNTER — Encounter: Payer: Self-pay | Admitting: Internal Medicine

## 2010-08-17 ENCOUNTER — Ambulatory Visit (INDEPENDENT_AMBULATORY_CARE_PROVIDER_SITE_OTHER): Payer: Managed Care, Other (non HMO)

## 2010-08-17 ENCOUNTER — Ambulatory Visit: Payer: Managed Care, Other (non HMO)

## 2010-08-17 DIAGNOSIS — Z23 Encounter for immunization: Secondary | ICD-10-CM

## 2010-08-17 DIAGNOSIS — Z2911 Encounter for prophylactic immunotherapy for respiratory syncytial virus (RSV): Secondary | ICD-10-CM

## 2010-08-22 NOTE — Assessment & Plan Note (Signed)
Summary: shingles shot per ami/lb  Nurse Visit   Allergies: No Known Drug Allergies  Immunizations Administered:  Zostavax # 1:    Vaccine Type: Zostavax    Site: Right arm    Mfr: Merck    Dose: 0.65    Route: IM    Given by: Lamar Sprinkles, CMA    Exp. Date: 02/10/2011    Lot #: [id number]    VIS given: 04/06/05 given August 18, 2010.  Orders Added: 1)  Zoster (Shingles) Vaccine Live [90736] 2)  Admin 1st Vaccine [id number]

## 2010-09-18 ENCOUNTER — Telehealth: Payer: Self-pay | Admitting: Internal Medicine

## 2010-09-18 NOTE — Telephone Encounter (Signed)
Pt's wife called stating that Pt is in need of mail order refills to [facility] for: Amlodipine, Furosemide, Lisinopril, Metformin, Glimepiride, and Simvastatin

## 2010-09-18 NOTE — Telephone Encounter (Signed)
May have refills on all meds for the year.

## 2010-09-19 MED ORDER — LISINOPRIL 40 MG PO TABS: 40.0000 mg | ORAL_TABLET | Freq: Every day | ORAL | Status: DC

## 2010-09-19 MED ORDER — AMLODIPINE BESYLATE 10 MG PO TABS: 10.0000 mg | ORAL_TABLET | Freq: Every day | ORAL | Status: DC

## 2010-09-19 MED ORDER — FUROSEMIDE 40 MG PO TABS: 40.0000 mg | ORAL_TABLET | Freq: Every day | ORAL | Status: DC

## 2010-09-19 MED ORDER — METFORMIN HCL ER (MOD) 1000 MG PO TB24: 1000.0000 mg | ORAL_TABLET | Freq: Two times a day (BID) | ORAL | Status: DC

## 2010-09-19 MED ORDER — GLIMEPIRIDE 2 MG PO TABS: 1.0000 mg | ORAL_TABLET | ORAL | Status: DC

## 2010-09-19 MED ORDER — SIMVASTATIN 20 MG PO TABS: 20.0000 mg | ORAL_TABLET | Freq: Every day | ORAL | Status: DC

## 2010-09-19 NOTE — Telephone Encounter (Signed)
Pts wife is aware 

## 2010-09-20 ENCOUNTER — Telehealth: Payer: Self-pay | Admitting: *Deleted

## 2010-09-20 NOTE — Telephone Encounter (Signed)
Duplicate request

## 2010-09-26 ENCOUNTER — Other Ambulatory Visit: Payer: Self-pay | Admitting: Internal Medicine

## 2010-09-26 ENCOUNTER — Telehealth: Payer: Self-pay | Admitting: Internal Medicine

## 2010-09-26 MED ORDER — METFORMIN HCL 1000 MG PO TABS: 1000.0000 mg | ORAL_TABLET | Freq: Two times a day (BID) | ORAL | Status: DC

## 2010-09-26 MED ORDER — AMLODIPINE BESYLATE 10 MG PO TABS: 10.0000 mg | ORAL_TABLET | Freq: Every day | ORAL | Status: DC

## 2010-09-26 NOTE — Telephone Encounter (Signed)
Received Rx error from pharmacy: Called [facility] and gave VO to pharmacist/John

## 2010-09-26 NOTE — Progress Notes (Signed)
  Subjective:    Patient ID: Brandon Santos, male    DOB: 1934/04/07, 77 y.o.   MRN: [id number]  HPI    Review of Systems     Objective:   Physical Exam        Assessment & Plan:

## 2010-09-26 NOTE — Telephone Encounter (Signed)
Called [facility] and corrected error on Metformin order & Amlodipine [#90] Pt informed.

## 2010-09-26 NOTE — Progress Notes (Signed)
refill 

## 2010-10-11 ENCOUNTER — Telehealth: Payer: Self-pay | Admitting: *Deleted

## 2010-10-11 NOTE — Telephone Encounter (Signed)
Research side effects: gingival hyperplasia is listed as occuring in less than 1% of patients. I have a call in to Dr. Margaretha Seeds to ask if this is really a major concern.

## 2010-10-11 NOTE — Telephone Encounter (Signed)
Pt was seen by periodontist and has some gum disease. He was concerned about pt's BP med, amlodipine. The MD was going to send Dr Debby Bud a note regarding this. Please advise if you have seen this b/c pt is concerned.

## 2010-10-12 NOTE — Telephone Encounter (Signed)
Spoke with Dr. Margaretha Seeds - this is a problem that is not uncommon in his practice.  Plan - stop amlodipine           Start bystolic 5mg  once a day (samples if we have them)           BP check at 1 week - he can call in readings.

## 2010-10-18 ENCOUNTER — Other Ambulatory Visit: Payer: Self-pay | Admitting: *Deleted

## 2010-10-18 NOTE — Telephone Encounter (Signed)
1. Is his BP controlled on bystolic 5mg ? Need some readings please 2. Will substitute generic metoprolol - need to know 1 to dose 2

## 2010-10-18 NOTE — Telephone Encounter (Signed)
See previous phone note - Pt would like gen alt to bystolic due to cost.

## 2010-10-18 NOTE — Telephone Encounter (Signed)
Pt does not want to start bystolic (and has not started), he wants generic alt to amlodipine which he has to stop due to gum disease.

## 2010-10-18 NOTE — Telephone Encounter (Signed)
Left detailed vm for wife on her cell, samples (bystolic 10mg  take 1/2 tab once daily)

## 2010-10-19 MED ORDER — METOPROLOL TARTRATE 25 MG PO TABS: 25.0000 mg | ORAL_TABLET | Freq: Two times a day (BID) | ORAL | Status: DC

## 2010-10-19 NOTE — Telephone Encounter (Signed)
Entered med but did not sign - will need local pharmacy while trying out the drug

## 2010-10-19 NOTE — Telephone Encounter (Signed)
Patient informed. 

## 2010-10-19 NOTE — Telephone Encounter (Signed)
Sorry, I don't see dose and frequency.

## 2010-10-19 NOTE — Telephone Encounter (Signed)
The bystolic was to be a free way (samples) to see if beta -blocker would control his BP. He is the boss - order metoprolol as previously stated.

## 2010-10-23 ENCOUNTER — Telehealth: Payer: Self-pay | Admitting: *Deleted

## 2010-10-23 NOTE — Telephone Encounter (Signed)
Wife wants to double check that MD is ok w/pt taking metoprolol. Pharm med insert states to alert your MD if you have some specific conditions, one being diabetes. Pt is diabetic, is this med ok?

## 2010-10-24 NOTE — Telephone Encounter (Signed)
Yes this medication is ok to take as a diabetic

## 2010-10-24 NOTE — Telephone Encounter (Signed)
Patient notified per MD.

## 2010-11-06 ENCOUNTER — Other Ambulatory Visit: Payer: Self-pay | Admitting: *Deleted

## 2010-11-06 MED ORDER — METOPROLOL TARTRATE 25 MG PO TABS: 25.0000 mg | ORAL_TABLET | Freq: Two times a day (BID) | ORAL | Status: DC

## 2010-11-10 NOTE — Assessment & Plan Note (Signed)
[facility]                           PRIMARY CARE OFFICE NOTE   SOTIRIOS, NAVARRO                         MRN:          [id number]  DATE:05/29/2006                            DOB:          1933/11/18    Mr. Loch presents today for removal of a suspicious keratotic type  lesion on his back.   The patient is also for follow up of diabetes and hypertension.   PROCEDURE:  Shave biopsy of abnormal mole. Signed consent obtained.   PREP:  The patient was prepped with alcohol, anesthetized with 2%  Xylocaine and re prepped with Betadine.   DESCRIPTION OF PROCEDURE:  Using a dermablade a shaved biopsy is  preformed with specimen off the table to pathology. Resulting wound is  hyfrecated at a setting of 10 to control all bleeding, for fulguration  of base. The patient tolerated this procedure well.   VITAL SIGNS: Temperature was 99.2, blood pressure 185/92, pulse 97,  weight 192.   ASSESSMENT/PLAN:  1. Dermaplasia with suspicious multicolored irregular keratotic      lesions sent to pathology for analysis. Patient will be notified      when report is returned.  2. Diabetes. The patient reports that his capillary blood glucoses are      running 150 to 200. His present regimen of metformin 500 mg b.i.d.      The patient is to increase his metformin to 1000 mg b.i.d. then      continue to monitor blood sugars. Prescriptions provided for      Glucometer strips. The patient is to present data to me in 1 month.  3. Hypertension. The patient's blood pressure continues to be poorly      controlled with systolics running in the 150s at home, diastolics      in the 90s. On regimen of Diovan HCT 160/25. The patient to      discontinue Diovan after completing his present supply. Will switch      him to lisinopril 40 mg daily  and Lasix 20 mg daily. The patient      is to maintain a record of his blood pressures and we will adjust      his medications as needed  to obtain control.   The patient will provide me with information in 1 month. We will make  appropriate adjustments.     Rosalyn Gess Norins, MD  Electronically Signed    MEN/MedQ  DD: 05/30/2006  DT: 05/30/2006  Job #: [id number]

## 2010-11-10 NOTE — Assessment & Plan Note (Signed)
[facility]                             PRIMARY CARE OFFICE NOTE   Brandon Santos, Brandon Santos                         MRN:          [id number]  DATE:04/30/2006                            DOB:          10-24-1933    Brandon Santos is a very delightful 77 year old gentleman who presents for  followup evaluation and exam.  He was last seen in the office May 21, 2005 for follow up and review of a negative stress test.  His last full  physical exam was April 19, 2005.  Please see that complete dictation.   INTERVAL HISTORY:  He reports that he has been feeling well and doing well  with no new medical problems.   PAST MEDICAL HISTORY:   SURGICAL:  1. Appendectomy, remote.  2. Thoracostomy as an infant for pneumonia.   MEDICAL:  1. Usual childhood diseases.  2. Hyperplastic colon polyps.  3. Benign prostatic hypertrophy.  4. Hemorrhoids.  5. Hyperlipidemia.  6. Diet managed diabetes.  7. Mild dyspepsia.   FAMILY HISTORY:  Positive for peripheral vascular disease, diabetes and  heart failure.   SOCIAL HISTORY:  Patient is married 41 years.  He has 2 sons, age 74 and 34.  He has 1 grandchild and 1 grandchild on the way.  Patient has been retired  for 5 years. He enjoys his retirement reading, exercising on a regular  basis.   CURRENT MEDICATIONS:  1. Diovan HCT 160/25 one daily.  2. Crestor 5 mg daily.  3. Centrum multivitamin.  4. B-50.  5. Vitamin E.  6. Vitamin C.   CHART REVIEW:  Patient's last colonoscopy dated from January 08, 2006 where he  had a tubular adenomatous polyp.  He would be for follow up in 5 years.  Patient has recently seen Dr. Ashley Royalty April 18, 2006 with an unremarkable  examination.  Please see the attached, including __________  dye study.   REVIEW OF SYSTEMS:  Is negative for any constitutional, cardiovascular,  respiratory, GI or GU problems.   EXAMINATION:  Temperature is 98.1.  Blood pressure 188/91.  Pulse  84.  Weight 193.  GENERAL APPEARANCE:  This is a well-nourished, well-developed gentleman in  no acute distress.  HEENT:  Normocephalic, atraumatic.  EACs with TMs were unremarkable.  Oropharynx was negative.  Dentition in good repair.  No buccal or palate  lesions were noted.  Posterior pharynx was clear.  Conjunctivae was clear.  Sclerae was clear.  Pupils equal, round and reactive to light.  Funduscopic  exam is deferred to Ophthalmology.  NECK:  Was supple without splenomegaly.  No lymphadenopathy was noted in the  cervical supraclavicular regions.  CHEST:  No CVA tenderness.  LUNGS:  Were clear to auscultation and percussion.  CARDIOVASCULAR:  2+ radial pulses.  No JVD or carotid bruits.  He had a  quiet precordium with a regular rate and rhythm without murmurs, rubs or  gallops.  ABDOMEN:  Is soft.  No guarding or rebound.  No splenomegaly was noted.  RECTAL:  Revealed normal sphincter tone.  Prostate was smooth, normal size  and contour without nodules.  Generous at 3+.  EXTREMITIES:  No clubbing, cyanosis, edema or deformities were noted.  NEUROLOGIC:  Nonfocal.  DERMATOLOGY:  Patient is noted to have what appears to be a keratotic lesion  on his right upper back which appears larger than last years examination.  There are multi-variegated colored parts of this lesion with an irregular  border.   LABORATORY:  Hemoglobin was 14.2 grams, white count was 9,500 with a normal  differential.  Chemistries were normal.  Serum glucose elevated at 227.  Kidney function normal with a creatinine of 1.0 and a GFR of 79 milliliters  per minute.  Thyroid function normal with a TSH of 2.98.  PSA was normal at  1.67.  Urinalysis was negative.  Cholesterol 151, triglycerides 137, HDL was  34.7, LDL was 89.   ASSESSMENT AND PLAN:  1. Diabetes.  Patient's last hemoglobin A1C  was 6.8% and prior to that      6.2%.  A1C not available today.  Serum glucose is certainly elevated at      227 in a  fasting state.  Patient reports his home CBG monitoring which      shows his blood sugars consistently running between 140 and 190.  At      this point I feel that diet management failed.  Plan:  Patient is to      start on metformin 500 mg b.i.d.  He is to return in 1 month for      followup.  2. Lipids.  Patient is tolerating Crestor well.  He is at goal with an LDL      of less than 100.  HDL is mildly depressed but counter balanced by his      low LDL.  Plan: Patient to continue on his present medications.  3. Hypertension.  Patient has white coat hypertension.  His blood      pressures at home are much better controlled by his report running in      the 130s over 70s.  Plan:  Patient to continue his present medications.  4. Cardiovascular.  Patient with a negative stress study as noted.  He has      no symptoms.  5. Dermatology.  Patient with a changing mole.  I have suggested excision.      Plan:  Patient to return in 1 month for shaved biopsy of a suspicious      lesion.  6. Health maintenance.  Patient is current with colorectal cancer      screening.  PSA is normal.   SUMMARY:  A very pleasant gentleman now with need for medical management of  his diabetes with metformin started as noted.    ______________________________  Rosalyn Gess Norins, MD    MEN/MedQ  DD: 05/01/2006  DT: 05/01/2006  Job #: [id number]   cc:   Christen Bame

## 2010-11-23 ENCOUNTER — Telehealth: Payer: Self-pay | Admitting: *Deleted

## 2010-11-23 MED ORDER — METOPROLOL TARTRATE 25 MG PO TABS: 25.0000 mg | ORAL_TABLET | Freq: Two times a day (BID) | ORAL | Status: DC

## 2010-11-23 NOTE — Telephone Encounter (Signed)
Patient requesting RF of metoprolol to go to [facility] - 90 day supply 3 rfs OK? (Pt needs a call when complete)

## 2010-11-23 NOTE — Telephone Encounter (Signed)
Pt advised via VM 

## 2010-11-23 NOTE — Telephone Encounter (Signed)
Ok for 90 day Rx with 3 refills

## 2011-06-04 ENCOUNTER — Ambulatory Visit (INDEPENDENT_AMBULATORY_CARE_PROVIDER_SITE_OTHER): Payer: Managed Care, Other (non HMO) | Admitting: *Deleted

## 2011-06-04 DIAGNOSIS — Z23 Encounter for immunization: Secondary | ICD-10-CM

## 2011-06-26 HISTORY — PX: COLONOSCOPY: SHX174

## 2011-07-18 ENCOUNTER — Ambulatory Visit (INDEPENDENT_AMBULATORY_CARE_PROVIDER_SITE_OTHER): Payer: Managed Care, Other (non HMO) | Admitting: Ophthalmology

## 2011-07-18 DIAGNOSIS — E1165 Type 2 diabetes mellitus with hyperglycemia: Secondary | ICD-10-CM

## 2011-07-18 DIAGNOSIS — H43819 Vitreous degeneration, unspecified eye: Secondary | ICD-10-CM

## 2011-07-18 DIAGNOSIS — H2510 Age-related nuclear cataract, unspecified eye: Secondary | ICD-10-CM

## 2011-07-18 DIAGNOSIS — E11319 Type 2 diabetes mellitus with unspecified diabetic retinopathy without macular edema: Secondary | ICD-10-CM

## 2011-08-08 ENCOUNTER — Encounter: Payer: Managed Care, Other (non HMO) | Admitting: Internal Medicine

## 2011-08-08 ENCOUNTER — Ambulatory Visit (INDEPENDENT_AMBULATORY_CARE_PROVIDER_SITE_OTHER): Payer: Managed Care, Other (non HMO) | Admitting: Internal Medicine

## 2011-08-08 ENCOUNTER — Encounter: Payer: Self-pay | Admitting: Internal Medicine

## 2011-08-08 ENCOUNTER — Other Ambulatory Visit (INDEPENDENT_AMBULATORY_CARE_PROVIDER_SITE_OTHER): Payer: Managed Care, Other (non HMO)

## 2011-08-08 DIAGNOSIS — E119 Type 2 diabetes mellitus without complications: Secondary | ICD-10-CM

## 2011-08-08 DIAGNOSIS — E785 Hyperlipidemia, unspecified: Secondary | ICD-10-CM

## 2011-08-08 DIAGNOSIS — I10 Essential (primary) hypertension: Secondary | ICD-10-CM

## 2011-08-08 DIAGNOSIS — Z0000 Encounter for general adult medical examination without abnormal findings: Secondary | ICD-10-CM

## 2011-08-08 LAB — LIPID PANEL
Cholesterol: 125 mg/dL (ref 0–200)
LDL Cholesterol: 58 mg/dL (ref 0–99)
Total CHOL/HDL Ratio: 3
Triglycerides: 101 mg/dL (ref 0.0–149.0)
VLDL: 20.2 mg/dL (ref 0.0–40.0)

## 2011-08-08 LAB — COMPREHENSIVE METABOLIC PANEL
ALT: 24 U/L (ref 0–53)
AST: 24 U/L (ref 0–37)
Alkaline Phosphatase: 42 U/L (ref 39–117)
BUN: 20 mg/dL (ref 6–23)
Creatinine, Ser: 1.1 mg/dL (ref 0.4–1.5)
Total Bilirubin: 0.9 mg/dL (ref 0.3–1.2)

## 2011-08-08 LAB — HEPATIC FUNCTION PANEL
ALT: 24 U/L (ref 0–53)
AST: 24 U/L (ref 0–37)
Total Bilirubin: 0.9 mg/dL (ref 0.3–1.2)

## 2011-08-08 LAB — HEMOGLOBIN A1C: Hgb A1c MFr Bld: 6.2 % (ref 4.6–6.5)

## 2011-08-08 MED ORDER — SIMVASTATIN 20 MG PO TABS: 20.0000 mg | ORAL_TABLET | Freq: Every day | ORAL | Status: DC

## 2011-08-08 MED ORDER — GLIMEPIRIDE 2 MG PO TABS: 1.0000 mg | ORAL_TABLET | ORAL | Status: DC

## 2011-08-08 MED ORDER — FUROSEMIDE 40 MG PO TABS: 40.0000 mg | ORAL_TABLET | Freq: Every day | ORAL | Status: DC

## 2011-08-08 MED ORDER — METOPROLOL TARTRATE 25 MG PO TABS: 25.0000 mg | ORAL_TABLET | Freq: Two times a day (BID) | ORAL | Status: DC

## 2011-08-08 MED ORDER — METFORMIN HCL 1000 MG PO TABS: 1000.0000 mg | ORAL_TABLET | Freq: Two times a day (BID) | ORAL | Status: DC

## 2011-08-08 MED ORDER — LISINOPRIL 40 MG PO TABS: 40.0000 mg | ORAL_TABLET | Freq: Every day | ORAL | Status: DC

## 2011-08-08 NOTE — Progress Notes (Signed)
Subjective:    Patient ID: Brandon Santos, male    DOB: [birth year], 78 y.o.   MRN: [id number]  HPI The patient is here for annual Medicare wellness examination and management of other chronic and acute problems. Interval history unremarkable.    The risk factors are reflected in the social history.  The roster of all physicians providing medical care to patient - is listed in the Snapshot section of the chart.  Activities of daily living:  The patient is 100% inedpendent in all ADLs: dressing, toileting, feeding as well as independent mobility  Home safety : The patient has smoke detectors in the home. Fall - house is fall safe.  They wear seatbelts. No firearms at home   There is no risks for hepatitis, STDs or HIV. There is no  history of blood transfusion. They have no travel history to infectious disease endemic areas of the world.  The patient has seen their dentist in the last six month. They have seen their eye doctor in the last year. They deny any hearing difficulty and have not had audiologic testing in the last year.  They do not  have excessive sun exposure. Discussed the need for sun protection: hats, long sleeves and use of sunscreen if there is significant sun exposure.   Diet: the importance of a healthy diet is discussed. They do have a healthy  diet.  The patient has a regular exercise program: spin-bike , 20 min duration, 4 per week.  The benefits of regular aerobic exercise were discussed.  Depression screen: there are no signs or vegative symptoms of depression- irritability, change in appetite, anhedonia, sadness/tearfullness.  Cognitive assessment: the patient manages all their financial and personal affairs and is actively engaged.   The following portions of the patient's history were reviewed and updated as appropriate: allergies, current medications, past family history, past medical history,  past surgical history, past social history  and problem  list.  Vision, hearing, body mass index were assessed and reviewed.   During the course of the visit the patient was educated and counseled about appropriate screening and preventive services including : fall prevention , diabetes screening, nutrition counseling, colorectal cancer screening, and recommended immunizations.    Review of Systems Constitutional:  Negative for fever, chills, activity change and unexpected weight change.  HEENT:  Negative for hearing loss, ear pain, congestion, neck stiffness and postnasal drip. Negative for sore throat or swallowing problems. Negative for dental complaints.   Eyes: Negative for vision loss or change in visual acuity.  Respiratory: Negative for chest tightness and wheezing. Negative for DOE.   Cardiovascular: Negative for chest pain or palpitations. No decreased exercise tolerance Gastrointestinal: No change in bowel habit. No bloating or gas. No reflux or indigestion Genitourinary: Negative for urgency, frequency, flank pain and difficulty urinating.  Musculoskeletal: Negative for myalgias, back pain, arthralgias and gait problem.  Neurological: Negative for dizziness, tremors, weakness and headaches.  Hematological: Negative for adenopathy.  Psychiatric/Behavioral: Negative for behavioral problems and dysphoric mood.       Objective:   Physical Exam Filed Vitals:   08/08/11 0858  BP: 138/80  Pulse: 77  Temp: 97.2 F (36.2 C)  Resp: 16   Gen'l: Well nourished well developed [place] male in no acute distress  HEENT: Head: Normocephalic and atraumatic. Right Ear: External ear normal. EAC/TM nl. Left Ear: External ear normal.  EAC/TM nl. Nose: Nose normal. Mouth/Throat: Oropharynx is clear and moist. Dentition - native, in good repair. No buccal or  palatal lesions. Posterior pharynx clear. Eyes: Conjunctivae and sclera clear. EOM intact. Pupils are equal, round, and reactive to light. Right eye exhibits no discharge. Left eye exhibits no  discharge. Neck: Normal range of motion. Neck supple. No JVD present. No tracheal deviation present. No thyromegaly present.  Cardiovascular: Normal rate, regular rhythm, no gallop, no friction rub, no murmur heard.      Quiet precordium. 2+ radial and DP pulses . No carotid bruits Pulmonary/Chest: Effort normal. No respiratory distress or increased WOB, no wheezes, no rales. No chest wall deformity or CVAT. Abdominal: Soft. Bowel sounds are normal in all quadrants. He exhibits no distension, no tenderness, no rebound or guarding, No heptosplenomegaly  Genitourinary:  deferred Musculoskeletal: Normal range of motion. He exhibits no edema and no tenderness.       Small and large joints without redness, synovial thickening or deformity. Full range of motion preserved about all small, median and large joints.  Lymphadenopathy:    He has no cervical or supraclavicular adenopathy.  Neurological: He is alert and oriented to person, place, and time. CN II-XII intact. DTRs 2+ and symmetrical biceps, radial and patellar tendons. Cerebellar function normal with no tremor, rigidity, normal gait and station.  Skin: Skin is warm and dry. No rash noted. No erythema.  Psychiatric: He has a normal mood and affect. His behavior is normal. Thought content normal.   Lab Results  Component Value Date   WBC 11.6* 07/26/2010   HGB 13.5 07/26/2010   HCT 40.2 07/26/2010   PLT 194.0 07/26/2010   GLUCOSE 103* 08/08/2011   CHOL 125 08/08/2011   TRIG 101.0 08/08/2011   HDL 47.00 08/08/2011   LDLCALC 58 08/08/2011        ALT 24 08/08/2011   AST 24 08/08/2011        NA 140 08/08/2011   K 4.7 08/08/2011   CL 106 08/08/2011   CREATININE 1.1 08/08/2011   BUN 20 08/08/2011   CO2 28 08/08/2011   TSH 3.67 07/26/2010   PSA 2.45 07/04/2009   HGBA1C 6.2 08/08/2011           Assessment & Plan:

## 2011-08-09 NOTE — Assessment & Plan Note (Signed)
Interval medical history is benign. Physical exam is normal. Lab results are in normal limits. Will check old records re: colorectal cancer screening. Immunizations are up to date.   In summary- a delightful gentleman, active in his retirement, who is medically stable. He is encouraged to add flex/stretch exercising to his regimen. He will return in 6 months for routine follow-up.

## 2011-08-09 NOTE — Assessment & Plan Note (Signed)
BP Readings from Last 3 Encounters:  08/08/11 138/80  08/01/10 138/66  07/11/09 172/92   Last two visits with good control on present regimen. Lab reveals normal renal function and electrolyte levels.   Plan - continue present medications, including ACE-I

## 2011-08-09 NOTE — Assessment & Plan Note (Signed)
Serum glucose 103, A1C 6.2% - excellent control with diet and metformin. He has seen Dr. Jerolyn Center - report to be scanned - and he has no diabetic retinopathy. No problem with sensation feet.  Plan- continue present regimen.

## 2011-08-09 NOTE — Assessment & Plan Note (Signed)
LDL 58 - better than goal of 100 or less; HDL 47 - better than goal of 40+. He is tolerating medication well.  Plan - continue present regimen.

## 2011-08-24 ENCOUNTER — Encounter: Payer: Self-pay | Admitting: Internal Medicine

## 2012-03-25 ENCOUNTER — Encounter: Payer: Self-pay | Admitting: Internal Medicine

## 2012-03-28 ENCOUNTER — Encounter: Payer: Self-pay | Admitting: Internal Medicine

## 2012-03-28 ENCOUNTER — Telehealth: Payer: Self-pay | Admitting: Internal Medicine

## 2012-03-28 NOTE — Telephone Encounter (Signed)
Patient was due for colon in 2012.  He had adenomatous polyps on colon from 01/09/06.  His wife will call back and set up recall once she clears both her and her husbands schedules

## 2012-04-30 ENCOUNTER — Ambulatory Visit (INDEPENDENT_AMBULATORY_CARE_PROVIDER_SITE_OTHER): Payer: Managed Care, Other (non HMO) | Admitting: Internal Medicine

## 2012-04-30 ENCOUNTER — Other Ambulatory Visit (INDEPENDENT_AMBULATORY_CARE_PROVIDER_SITE_OTHER): Payer: Managed Care, Other (non HMO)

## 2012-04-30 ENCOUNTER — Encounter: Payer: Self-pay | Admitting: Internal Medicine

## 2012-04-30 VITALS — BP 150/80 | HR 72 | Temp 97.5°F | Resp 16 | Wt 182.0 lb

## 2012-04-30 DIAGNOSIS — Z0000 Encounter for general adult medical examination without abnormal findings: Secondary | ICD-10-CM

## 2012-04-30 DIAGNOSIS — E119 Type 2 diabetes mellitus without complications: Secondary | ICD-10-CM

## 2012-04-30 DIAGNOSIS — E785 Hyperlipidemia, unspecified: Secondary | ICD-10-CM

## 2012-04-30 DIAGNOSIS — Z23 Encounter for immunization: Secondary | ICD-10-CM

## 2012-04-30 LAB — LIPID PANEL
Cholesterol: 120 mg/dL (ref 0–200)
HDL: 40.3 mg/dL (ref >39.00)
Total CHOL/HDL Ratio: 3
Triglycerides: 133 mg/dL (ref 0.0–149.0)

## 2012-04-30 NOTE — Progress Notes (Signed)
  Subjective:    Patient ID: Brandon Santos, male    DOB: 08-Jul-1933, 78 y.o.   MRN: [id number]  HPI Brandon Santos presents for a prevention visit and completion of a biometric form for insurance purposes. He voices no complaints.  PMH, FamHx and SocHx reviewed for any changes and relevance. Current Outpatient Prescriptions on File Prior to Visit  Medication Sig Dispense Refill  . ascorbic acid (VITAMIN C) 500 MG tablet Take 500 mg by mouth daily.      . B Complex Vitamins (B COMPLEX 50) TABS Take by mouth daily.      . Calcium-Vitamin D (CALTRATE 600 PLUS-VIT D PO) Take by mouth daily.      . Cholecalciferol 1000 UNITS capsule Take 1,000 Units by mouth daily.      . furosemide (LASIX) 40 MG tablet Take 1 tablet (40 mg total) by mouth daily.  90 tablet  3  . glimepiride (AMARYL) 2 MG tablet Take 0.5 tablets (1 mg total) by mouth every morning.  45 tablet  3  . lisinopril (PRINIVIL,ZESTRIL) 40 MG tablet Take 1 tablet (40 mg total) by mouth daily.  90 tablet  3  . metFORMIN (GLUCOPHAGE) 1000 MG tablet Take 1 tablet (1,000 mg total) by mouth 2 (two) times daily with a meal.  180 tablet  3  . metoprolol tartrate (LOPRESSOR) 25 MG tablet Take 1 tablet (25 mg total) by mouth 2 (two) times daily.  180 tablet  3  . Multiple Vitamins-Minerals (CENTRUM PO) Take by mouth daily.      . simvastatin (ZOCOR) 20 MG tablet Take 1 tablet (20 mg total) by mouth at bedtime.  90 tablet  3  . vitamin E 400 UNIT capsule Take 400 Units by mouth daily.          Review of Systems System review is negative for any constitutional, cardiac, pulmonary, GI or neuro symptoms or complaints other than as described in the HPI.     Objective:   Physical Exam Filed Vitals:   04/30/12 1309  BP: 150/80  Pulse: 72  Temp: 97.5 F (36.4 C)  Resp: 16   Waist 40.5"  WNWD man in no distress Cor - RRR Pulm - normal respirations       Assessment & Plan:  Wellness - reviewed chart - immunizations are up to date. He is  already scheduled for follow-up colonoscopy. He is due for lipid panel all other labs will be brought up to date at his annual full physical exam

## 2012-04-30 NOTE — Patient Instructions (Addendum)
Biometrics exam - not really a full physical. Mild elevation in SBP today but if home readings are consistently in the 130's thee is no need for any changes in regimen.  You will have access to cholesterol levels through Mychart once you activate an account.

## 2012-05-14 ENCOUNTER — Ambulatory Visit (AMBULATORY_SURGERY_CENTER): Payer: Managed Care, Other (non HMO) | Admitting: *Deleted

## 2012-05-14 VITALS — Ht 66.5 in | Wt 186.0 lb

## 2012-05-14 DIAGNOSIS — Z1211 Encounter for screening for malignant neoplasm of colon: Secondary | ICD-10-CM

## 2012-05-14 DIAGNOSIS — Z86010 Personal history of colonic polyps: Secondary | ICD-10-CM

## 2012-05-14 MED ORDER — MOVIPREP 100 G PO SOLR: ORAL | Status: DC

## 2012-05-15 ENCOUNTER — Encounter: Payer: Self-pay | Admitting: Internal Medicine

## 2012-05-20 ENCOUNTER — Encounter: Payer: Managed Care, Other (non HMO) | Admitting: Internal Medicine

## 2012-05-28 ENCOUNTER — Encounter: Payer: Self-pay | Admitting: Internal Medicine

## 2012-05-28 ENCOUNTER — Ambulatory Visit (AMBULATORY_SURGERY_CENTER): Payer: Managed Care, Other (non HMO) | Admitting: Internal Medicine

## 2012-05-28 VITALS — BP 155/77 | HR 54 | Temp 96.9°F | Resp 23 | Ht 66.0 in | Wt 186.0 lb

## 2012-05-28 DIAGNOSIS — Z86010 Personal history of colonic polyps: Secondary | ICD-10-CM

## 2012-05-28 DIAGNOSIS — D126 Benign neoplasm of colon, unspecified: Secondary | ICD-10-CM

## 2012-05-28 DIAGNOSIS — Z1211 Encounter for screening for malignant neoplasm of colon: Secondary | ICD-10-CM

## 2012-05-28 DIAGNOSIS — K5289 Other specified noninfective gastroenteritis and colitis: Secondary | ICD-10-CM

## 2012-05-28 MED ORDER — SODIUM CHLORIDE 0.9 % IV SOLN: 500.0000 mL | INTRAVENOUS | Status: DC

## 2012-05-28 NOTE — Progress Notes (Signed)
Propofol given over incremental dosages 

## 2012-05-28 NOTE — Op Note (Signed)
[facility] [address] [place], [address]   COLONOSCOPY PROCEDURE REPORT  PATIENT: Brandon, Santos  MR#: [id number] BIRTHDATE: 01/23/1934 , 78  yrs. old GENDER: Male ENDOSCOPIST: Roxy Cedar, MD REFERRED XB:[id number] Program Recall PROCEDURE DATE:  05/28/2012 PROCEDURE:   Colonoscopy with snare polypectomy    x 2 ASA CLASS:   Class II INDICATIONS:Patient's personal history of adenomatous colon polyps. 2002 (hpp); 2007 (small TA) MEDICATIONS: MAC sedation, administered by CRNA and propofol (Diprivan) 150mg  IV  DESCRIPTION OF PROCEDURE:   After the risks benefits and alternatives of the procedure were thoroughly explained, informed consent was obtained.  A digital rectal exam revealed no abnormalities of the rectum.   The LB [id number]  endoscope was introduced through the anus and advanced to the cecum, which was identified by both the appendix and ileocecal valve. No adverse events experienced.   The quality of the prep was excellent, using MoviPrep  The instrument was then slowly withdrawn as the colon was fully examined.      COLON FINDINGS: Two polyps ranging between 3-37mm in size were found in the transverse colon.  A polypectomy was performed with a cold snare.  The resection was complete and the polyp tissue was completely retrieved.   Mild diverticulosis was noted in the sigmoid colon.   The colon mucosa was otherwise normal. Retroflexed views revealed no abnormalities. The time to cecum=4 minutes 04 seconds.  Withdrawal time=12 minutes 30 seconds.  The scope was withdrawn and the procedure completed. COMPLICATIONS: There were no complications.  ENDOSCOPIC IMPRESSION: 1.   Two polyps ranging between 3-57mm in size were found in the transverse colon; polypectomy was performed with a cold snare 2.   Mild diverticulosis was noted in the sigmoid colon 3.   The colon mucosa was otherwise normal  RECOMMENDATIONS: 1. Return to the care  of your primary provider.  GI follow up as needed   eSigned:  Roxy Cedar, MD 05/28/2012 10:09 AM   cc: Jacques Navy, MD and The Patient   PATIENT NAME:  Brandon, Santos MR#: [id number]

## 2012-05-28 NOTE — Progress Notes (Signed)
No complaints noted in the recovery room. Maw  Patient did not experience any of the following events: a burn prior to discharge; a fall within the facility; wrong site/side/patient/procedure/implant event; or a hospital transfer or hospital admission upon discharge from the facility. (G8907) Patient did not have preoperative order for IV antibiotic SSI prophylaxis. (G8918)  

## 2012-05-28 NOTE — Patient Instructions (Addendum)
You blood sugar was 84 in the recovery room.  Please eat and drink something when you get home.  You may resume your current medications today.  Handouts were given to your care partner on diverticulosis, high fiber diet and polyps.  Please call if any questions or concerns.    YOU HAD AN ENDOSCOPIC PROCEDURE TODAY AT THE [facility]: Refer to the procedure report that was given to you for any specific questions about what was found during the examination.  If the procedure report does not answer your questions, please call your gastroenterologist to clarify.  If you requested that your care partner not be given the details of your procedure findings, then the procedure report has been included in a sealed envelope for you to review at your convenience later.  YOU SHOULD EXPECT: Some feelings of bloating in the abdomen. Passage of more gas than usual.  Walking can help get rid of the air that was put into your GI tract during the procedure and reduce the bloating. If you had a lower endoscopy (such as a colonoscopy or flexible sigmoidoscopy) you may notice spotting of blood in your stool or on the toilet paper. If you underwent a bowel prep for your procedure, then you may not have a normal bowel movement for a few days.  DIET: Your first meal following the procedure should be a light meal and then it is ok to progress to your normal diet.  A half-sandwich or bowl of soup is an example of a good first meal.  Heavy or fried foods are harder to digest and may make you feel nauseous or bloated.  Likewise meals heavy in dairy and vegetables can cause extra gas to form and this can also increase the bloating.  Drink plenty of fluids but you should avoid alcoholic beverages for 24 hours.  ACTIVITY: Your care partner should take you home directly after the procedure.  You should plan to take it easy, moving slowly for the rest of the day.  You can resume normal activity the day after the procedure  however you should NOT DRIVE or use heavy machinery for 24 hours (because of the sedation medicines used during the test).    SYMPTOMS TO REPORT IMMEDIATELY: A gastroenterologist can be reached at any hour.  During normal business hours, 8:30 AM to 5:00 PM Monday through Friday, call [phone].  After hours and on weekends, please call the GI answering service at [phone] who will take a message and have the physician on call contact you.   Following lower endoscopy (colonoscopy or flexible sigmoidoscopy):  Excessive amounts of blood in the stool  Significant tenderness or worsening of abdominal pains  Swelling of the abdomen that is new, acute  Fever of 100F or higher    Black, tarry-looking stools  FOLLOW UP: If any biopsies were taken you will be contacted by phone or by letter within the next 1-3 weeks.  Call your gastroenterologist if you have not heard about the biopsies in 3 weeks.  Our staff will call the home number listed on your records the next business day following your procedure to check on you and address any questions or concerns that you may have at that time regarding the information given to you following your procedure. This is a courtesy call and so if there is no answer at the home number and we have not heard from you through the emergency physician on call, we will assume that you  have returned to your regular daily activities without incident.  SIGNATURES/CONFIDENTIALITY: You and/or your care partner have signed paperwork which will be entered into your electronic medical record.  These signatures attest to the fact that that the information above on your After Visit Summary has been reviewed and is understood.  Full responsibility of the confidentiality of this discharge information lies with you and/or your care-partner.  

## 2012-05-29 ENCOUNTER — Telehealth: Payer: Self-pay

## 2012-05-29 NOTE — Telephone Encounter (Signed)
Left a message at [phone] for the pt to

## 2012-05-29 NOTE — Telephone Encounter (Signed)
Left a message at [phone] for the pt to call if any questions or concerns. Maw

## 2012-06-02 ENCOUNTER — Encounter: Payer: Self-pay | Admitting: Internal Medicine

## 2012-07-23 ENCOUNTER — Ambulatory Visit (INDEPENDENT_AMBULATORY_CARE_PROVIDER_SITE_OTHER): Payer: Managed Care, Other (non HMO) | Admitting: Ophthalmology

## 2012-08-11 ENCOUNTER — Ambulatory Visit (INDEPENDENT_AMBULATORY_CARE_PROVIDER_SITE_OTHER): Payer: Managed Care, Other (non HMO) | Admitting: Ophthalmology

## 2012-08-13 ENCOUNTER — Encounter: Payer: Managed Care, Other (non HMO) | Admitting: Internal Medicine

## 2012-08-20 ENCOUNTER — Ambulatory Visit (INDEPENDENT_AMBULATORY_CARE_PROVIDER_SITE_OTHER): Payer: Managed Care, Other (non HMO) | Admitting: Ophthalmology

## 2012-08-20 DIAGNOSIS — H43819 Vitreous degeneration, unspecified eye: Secondary | ICD-10-CM

## 2012-08-27 ENCOUNTER — Other Ambulatory Visit (INDEPENDENT_AMBULATORY_CARE_PROVIDER_SITE_OTHER): Payer: Managed Care, Other (non HMO)

## 2012-08-27 ENCOUNTER — Encounter: Payer: Self-pay | Admitting: Internal Medicine

## 2012-08-27 ENCOUNTER — Ambulatory Visit (INDEPENDENT_AMBULATORY_CARE_PROVIDER_SITE_OTHER): Payer: Managed Care, Other (non HMO) | Admitting: Internal Medicine

## 2012-08-27 VITALS — BP 164/70 | HR 64 | Temp 97.5°F | Resp 12 | Wt 182.0 lb

## 2012-08-27 DIAGNOSIS — I10 Essential (primary) hypertension: Secondary | ICD-10-CM

## 2012-08-27 DIAGNOSIS — E785 Hyperlipidemia, unspecified: Secondary | ICD-10-CM

## 2012-08-27 DIAGNOSIS — E119 Type 2 diabetes mellitus without complications: Secondary | ICD-10-CM

## 2012-08-27 DIAGNOSIS — D126 Benign neoplasm of colon, unspecified: Secondary | ICD-10-CM

## 2012-08-27 DIAGNOSIS — Z0000 Encounter for general adult medical examination without abnormal findings: Secondary | ICD-10-CM

## 2012-08-27 LAB — COMPREHENSIVE METABOLIC PANEL
AST: 26 U/L (ref 0–37)
BUN: 18 mg/dL (ref 6–23)
Calcium: 9.7 mg/dL (ref 8.4–10.5)
Chloride: 101 mEq/L (ref 96–112)
Creatinine, Ser: 1.2 mg/dL (ref 0.4–1.5)
GFR: 60.76 mL/min (ref >60.00)
Glucose, Bld: 88 mg/dL (ref 70–99)

## 2012-08-27 LAB — HEMOGLOBIN A1C: Hgb A1c MFr Bld: 6 % (ref 4.6–6.5)

## 2012-08-27 NOTE — Progress Notes (Deleted)
Patient ID: Brandon Santos, male   DOB: 1933-07-21, 79 y.o.   MRN: [id number] .mnmc

## 2012-08-27 NOTE — Progress Notes (Signed)
Subjective:     Patient ID: Brandon Santos, male   DOB: November 25, 1933, 79 y.o.   MRN: [id number]  HPI The patient is here for annual Medicare wellness examination and management of other chronic and acute problems.   The risk factors are reflected in the social history.  The roster of all physicians providing medical care to patient - is listed in the Snapshot section of the chart.  Activities of daily living:  The patient is 100% inedpendent in all ADLs: dressing, toileting, feeding as well as independent mobility  Home safety : The patient has smoke detectors in the home. They wear seatbelts. No falls are reported and the home is fall safe - he has installed grab bars in the shower. No firearms at home.  There is no violence in the home.   There is no risks for hepatitis, STDs or HIV. There is no   history of blood transfusion. They have no travel history to infectious disease endemic areas of the world.  The patient was not asked about recent dental visits. They have seen their eye doctor in the last year. They deny any hearing difficulty and have not had audiologic testing in the last year.    They do not  have excessive sun exposure. Discussed the need for sun protection: hats, long sleeves and use of sunscreen if there is significant sun exposure.   Diet: the importance of a healthy diet is discussed. They do have a healthy diet.  The patient does have a regular exercise regimen.  The benefits of regular aerobic exercise were discussed.  Depression screen: there are no signs or vegative symptoms of depression- irritability, change in appetite, anhedonia, sadness/tearfullness.  Cognitive assessment: the patient manages all their financial and personal affairs and is actively engaged.   The following portions of the patient's history were reviewed and updated as appropriate: allergies, current medications, past family history, past medical history,  past surgical history, past social  history  and problem list.  Vision, hearing, body mass index were assessed and reviewed.   During the course of the visit the patient was educated and counseled about appropriate screening and preventive services including : fall prevention , diabetes screening, nutrition counseling, colorectal cancer screening, and recommended immunizations.  Active Ambulatory Problems    Diagnosis Date Noted  . POLYP, COLON 03/22/2007  . DIABETES MELLITUS 03/22/2007  . HYPERLIPIDEMIA 03/22/2007  . HYPERTENSION 03/22/2007  . HEMORRHOIDS 03/22/2007  . BENIGN PROSTATIC HYPERTROPHY, HX OF 03/22/2007  . APPENDECTOMY, HX OF 03/22/2007  . Routine health maintenance 08/09/2011   Resolved Ambulatory Problems    Diagnosis Date Noted  . No Resolved Ambulatory Problems   Past Medical History  Diagnosis Date  . Hypertension   . Diabetes mellitus without complication    Current Outpatient Prescriptions on File Prior to Visit  Medication Sig Dispense Refill  . ascorbic acid (VITAMIN C) 500 MG tablet Take 500 mg by mouth daily. With [employer]      . aspirin 81 MG tablet Take 81 mg by mouth daily.      . B Complex Vitamins (B COMPLEX 50) TABS Take by mouth daily.      . Calcium-Vitamin D (CALTRATE 600 PLUS-VIT D PO) Take by mouth daily.      . Cholecalciferol 1000 UNITS capsule Take 1,000 Units by mouth daily.      . Multiple Vitamins-Minerals (CENTRUM PO) Take by mouth daily.      . vitamin E 400 UNIT capsule Take  400 Units by mouth daily.      . furosemide (LASIX) 40 MG tablet Take 1 tablet (40 mg total) by mouth daily.  90 tablet  3  . glimepiride (AMARYL) 2 MG tablet Take 0.5 tablets (1 mg total) by mouth every morning.  45 tablet  3  . lisinopril (PRINIVIL,ZESTRIL) 40 MG tablet Take 1 tablet (40 mg total) by mouth daily.  90 tablet  3  . metFORMIN (GLUCOPHAGE) 1000 MG tablet Take 1 tablet (1,000 mg total) by mouth 2 (two) times daily with a meal.  180 tablet  3  . metoprolol tartrate (LOPRESSOR) 25 MG  tablet Take 1 tablet (25 mg total) by mouth 2 (two) times daily.  180 tablet  3  . simvastatin (ZOCOR) 20 MG tablet Take 1 tablet (20 mg total) by mouth at bedtime.  90 tablet  3   No current facility-administered medications on file prior to visit.   Family History  Problem Relation Age of Onset  . Colon cancer Neg Hx   . 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 supportive family-sons live nearby Music and literature lover: reads a lot , is learning the keyboard. marriage is in good health- SO with diagnosis of breast cancer '11 - she is doing well.Caffeine use/day:  none  Review of Systems Constitutional:  Negative for fever, chills, activity change and unexpected weight change.  HEENT:  Negative for hearing loss, ear pain, congestion, neck stiffness and postnasal drip. Negative for sore throat or swallowing problems. Negative for dental complaints.   Eyes: Negative for vision loss or change in visual acuity.  Respiratory: Negative for chest tightness and wheezing. Negative for DOE.   Cardiovascular: Negative for chest pain or palpitations. No decreased exercise tolerance Gastrointestinal: No change in bowel habit. No bloating or gas. No reflux or indigestion Genitourinary: Negative for urgency, frequency, flank pain and difficulty urinating.  Musculoskeletal: Negative for myalgias, back pain, arthralgias and gait problem.  Neurological: Negative for dizziness, tremors, weakness and headaches.  Hematological: Negative for adenopathy.  Psychiatric/Behavioral: Negative for behavioral problems and dysphoric mood.      Objective:   Physical Exam General: HEENT: Head - [place]/AT, Eyes - EOMI, PERRLA, sclera anicteric, no discharge from right or left eye, Ears - Right - TM clear to visualization, no effusion present, pinna non tender to manipulation Left - TM clear to visualization no effusion present,  pinna non-tender to manipulation CV: RRR, no m/r/g appreciated, 2+ radial and DP pulses bilaterally, no carotid bruits, no peripheral edema present Pulm: CTA bilaterally, no wheezes or crackles appreciated. Abd: soft, non-tender, non-distended, BS present in all quadrants Neuro: Foot exam - sensation to light touch and pinprick intact, slight decrease in vibratory sensation  Skin: approximately 1x1 cm seborrheic keratosis noted in upper right quadrant of back removed with minimal manipulation Psych: mood and affect appropriate for interaction          Assessment/Plan:     See problem list

## 2012-08-27 NOTE — Patient Instructions (Addendum)
Thanks for coming in to see me.  Your exam by [facility] and me is fine. You had a seborrheic keratosis- a benign skin lesion- on your back.   As you know the lipids are very well controlled. We will check an A1C today and the results will be on MyChart.  See you next year. I expect to hear a recording of a grand-daughter-grand father piano duet.

## 2012-08-27 NOTE — Assessment & Plan Note (Signed)
Assessment: Overall HTN well controlled Plan: refill antihypertensive medications (metoprolol, lisinopril, furosemide), pt has enough medication to last until he gets a chance to fill his prescriptions

## 2012-08-27 NOTE — Assessment & Plan Note (Signed)
Assessment: Pt's diabetes has been well controlled, he is taking precautions to prevent long term complications of diabetes including diet, exercise, and regular eye exams.  Plan: - refill his diabetes medications (metformin and glimepride) - Check A1c today - pt counseled on regular foot exams and proper foot care

## 2012-08-27 NOTE — Assessment & Plan Note (Signed)
Assessment: Pt had colonoscopy 11/14 in which a "polyp" was removed and sent to pathology, it was determined to be normal colonic mucosa.  Plan: - given normal colonoscopy and negative hx of cancer pt has aged out screening with colonoscopy (pt is 79 currently next colonoscopy would be at 36, routine screening is not recommended after age 79).

## 2012-08-27 NOTE — Assessment & Plan Note (Signed)
Assessment: Pt had lipid panel drawn 11/13 which was normal.  Hyperlipidemia is well controlled. Plan: Continue simvastatin, maintain good diet and exercise regimen

## 2012-08-28 ENCOUNTER — Other Ambulatory Visit: Payer: Self-pay

## 2012-08-28 MED ORDER — SIMVASTATIN 20 MG PO TABS: 20.0000 mg | ORAL_TABLET | Freq: Every day | ORAL | Status: DC

## 2012-08-28 MED ORDER — FUROSEMIDE 40 MG PO TABS: 40.0000 mg | ORAL_TABLET | Freq: Every day | ORAL | Status: DC

## 2012-08-28 MED ORDER — GLIMEPIRIDE 2 MG PO TABS: 1.0000 mg | ORAL_TABLET | ORAL | Status: DC

## 2012-08-28 MED ORDER — METOPROLOL TARTRATE 25 MG PO TABS: 25.0000 mg | ORAL_TABLET | Freq: Two times a day (BID) | ORAL | Status: DC

## 2012-08-28 MED ORDER — LISINOPRIL 40 MG PO TABS: 40.0000 mg | ORAL_TABLET | Freq: Every day | ORAL | Status: DC

## 2012-08-28 MED ORDER — METFORMIN HCL 1000 MG PO TABS: 1000.0000 mg | ORAL_TABLET | Freq: Two times a day (BID) | ORAL | Status: DC

## 2012-08-28 NOTE — Assessment & Plan Note (Signed)
Interval history is negative for any serious medical illness, surgery or injury. Physical exam is normal with a small seborrheic keratosis noted. Lab is pending. He is current with colorectal cancer screening and has aged out of prostate cancer screening. His immunizations are up to day.  In summary - a delightful man who appears to be medically stable. He is investing in his health and he is doing well. He will return in 1 year or sooner as needed.

## 2012-09-01 ENCOUNTER — Other Ambulatory Visit: Payer: Self-pay | Admitting: *Deleted

## 2012-09-01 MED ORDER — SIMVASTATIN 20 MG PO TABS: 20.0000 mg | ORAL_TABLET | Freq: Every day | ORAL | Status: DC

## 2012-09-01 MED ORDER — LISINOPRIL 40 MG PO TABS: 40.0000 mg | ORAL_TABLET | Freq: Every day | ORAL | Status: DC

## 2012-09-01 MED ORDER — METOPROLOL TARTRATE 25 MG PO TABS: 25.0000 mg | ORAL_TABLET | Freq: Two times a day (BID) | ORAL | Status: DC

## 2012-09-01 MED ORDER — FUROSEMIDE 40 MG PO TABS: 40.0000 mg | ORAL_TABLET | Freq: Every day | ORAL | Status: DC

## 2012-09-01 MED ORDER — METFORMIN HCL 1000 MG PO TABS: 1000.0000 mg | ORAL_TABLET | Freq: Two times a day (BID) | ORAL | Status: DC

## 2012-09-01 MED ORDER — GLIMEPIRIDE 2 MG PO TABS: 1.0000 mg | ORAL_TABLET | ORAL | Status: DC

## 2012-09-01 NOTE — Telephone Encounter (Signed)
Refills sent to [facility]

## 2013-01-26 ENCOUNTER — Emergency Department (HOSPITAL_COMMUNITY)
Admission: EM | Admit: 2013-01-26 | Discharge: 2013-01-26 | Disposition: A | Discharge status: Home / Self Care | Payer: Managed Care, Other (non HMO) | Attending: Emergency Medicine | Admitting: Emergency Medicine

## 2013-01-26 ENCOUNTER — Encounter (HOSPITAL_COMMUNITY): Payer: Self-pay

## 2013-01-26 DIAGNOSIS — Y929 Unspecified place or not applicable: Secondary | ICD-10-CM | POA: Insufficient documentation

## 2013-01-26 DIAGNOSIS — S61209A Unspecified open wound of unspecified finger without damage to nail, initial encounter: Secondary | ICD-10-CM | POA: Insufficient documentation

## 2013-01-26 DIAGNOSIS — Z7982 Long term (current) use of aspirin: Secondary | ICD-10-CM | POA: Insufficient documentation

## 2013-01-26 DIAGNOSIS — I10 Essential (primary) hypertension: Secondary | ICD-10-CM | POA: Insufficient documentation

## 2013-01-26 DIAGNOSIS — W268XXA Contact with other sharp object(s), not elsewhere classified, initial encounter: Secondary | ICD-10-CM | POA: Insufficient documentation

## 2013-01-26 DIAGNOSIS — S61219A Laceration without foreign body of unspecified finger without damage to nail, initial encounter: Secondary | ICD-10-CM

## 2013-01-26 DIAGNOSIS — E119 Type 2 diabetes mellitus without complications: Secondary | ICD-10-CM | POA: Insufficient documentation

## 2013-01-26 DIAGNOSIS — Z79899 Other long term (current) drug therapy: Secondary | ICD-10-CM | POA: Insufficient documentation

## 2013-01-26 DIAGNOSIS — Y9389 Activity, other specified: Secondary | ICD-10-CM | POA: Insufficient documentation

## 2013-01-26 NOTE — ED Notes (Signed)
Pt reports dissembling some equipment when he cut his Left ring finger this am. Pt reports last tetanus was less than 3 years ago. Pt reports the bleeding will not stop. Bleeding is controlled in triage. band aid applied at home pta

## 2013-01-26 NOTE — ED Provider Notes (Addendum)
CSN: [id number]     Arrival date & time 01/26/13  2208 History     First MD Initiated Contact with Patient 01/26/13 2303     Chief Complaint  Patient presents with  . Laceration   (Consider location/radiation/quality/duration/timing/severity/associated sxs/prior Treatment) HPI Comments: Patient has a very tiny small avulsion to the finger tip palmar surface of his left ring finger.  That occurred this morning, while he was cleaning some med, equipment, since that time.  It is intermittently used despite being covered with a Band-Aid.  This concerned the patient has last tetanus within the last 3 years  Patient is a 79 y.o. male presenting with skin laceration. The history is provided by the patient.  Laceration Location:  Finger Finger laceration location:  L ring finger Depth:  Cutaneous Quality: avulsion   Time since incident:  12 hours Laceration mechanism:  Metal edge Pain details:    Quality:  Aching   Severity:  Mild   Past Medical History  Diagnosis Date  . Hypertension   . Diabetes mellitus without complication    Past Surgical History  Procedure Laterality Date  . Appendectomy  1966  . Colonoscopy  2013    had it close to end of year, exam was normal   Family History  Problem Relation Age of Onset  . Colon cancer Neg Hx   . Heart disease Father    History  Substance Use Topics  . Smoking status: Never Smoker   . Smokeless tobacco: Never Used  . Alcohol Use: No     Comment: only drinks at Party/Holidays    Review of Systems  Neurological: Negative for numbness.  All other systems reviewed and are negative.    Allergies  Review of patient's allergies indicates no known allergies.  Home Medications   Current Outpatient Rx  Name  Route  Sig  Dispense  Refill  . ascorbic acid (VITAMIN C) 500 MG tablet   Oral   Take 500 mg by mouth daily. With [employer]         . aspirin EC 81 MG tablet   Oral   Take 81 mg by mouth daily.         . B  Complex Vitamins (B COMPLEX 50) TABS   Oral   Take 1 tablet by mouth daily.          . Calcium-Vitamin D (CALTRATE 600 PLUS-VIT D PO)   Oral   Take by mouth daily.         . Cholecalciferol 1000 UNITS capsule   Oral   Take 1,000 Units by mouth daily.         . furosemide (LASIX) 40 MG tablet   Oral   Take 40 mg by mouth daily.         Marland Kitchen glimepiride (AMARYL) 2 MG tablet   Oral   Take 1 mg by mouth 2 (two) times daily.         Marland Kitchen lisinopril (PRINIVIL,ZESTRIL) 40 MG tablet   Oral   Take 40 mg by mouth every morning.         . metFORMIN (GLUCOPHAGE) 1000 MG tablet   Oral   Take 1,000 mg by mouth 2 (two) times daily with a meal.         . metoprolol tartrate (LOPRESSOR) 25 MG tablet   Oral   Take 25 mg by mouth 2 (two) times daily.         . Multiple Vitamins-Minerals (CENTRUM  PO)   Oral   Take 1 tablet by mouth daily.          . simvastatin (ZOCOR) 20 MG tablet   Oral   Take 20 mg by mouth every evening.         . vitamin E 400 UNIT capsule   Oral   Take 400 Units by mouth daily.          BP 181/76  Pulse 65  Temp(Src) 98.2 F (36.8 C) (Oral)  Resp 18  SpO2 96% Physical Exam  Nursing note and vitals reviewed. Constitutional: He appears well-developed and well-nourished.  HENT:  Head: Normocephalic.  Cardiovascular: Normal rate and regular rhythm.   Pulmonary/Chest: Effort normal and breath sounds normal.  Musculoskeletal: Normal range of motion. He exhibits no edema and no tenderness.  Small superficial avulsion, to the palmar surface of the left ring finger.  No active bleeding at this time  Skin: Skin is warm. No erythema.    ED Course   Procedures (including critical care time)  Labs Reviewed - No data to display No results found. 1. Laceration of finger, initial encounter     MDM  Will Dermabond, superficial avulsion   Band aid applied after Derma bond dried  apx .5cm long  Arman Filter, NP 01/26/13 2323  Arman Filter, NP 02/05/13 2132

## 2013-01-27 NOTE — ED Provider Notes (Signed)
Medical screening examination/treatment/procedure(s) were performed by non-physician practitioner and as supervising physician I was immediately available for consultation/collaboration.  Ani Deoliveira M Amrie Gurganus, MD 01/27/13 0554 

## 2013-02-06 NOTE — ED Provider Notes (Signed)
Medical screening examination/treatment/procedure(s) were performed by non-physician practitioner and as supervising physician I was immediately available for consultation/collaboration.  Olivia Mackie, MD 02/06/13 [phone]

## 2013-04-30 ENCOUNTER — Other Ambulatory Visit: Payer: Self-pay

## 2013-05-05 ENCOUNTER — Ambulatory Visit (INDEPENDENT_AMBULATORY_CARE_PROVIDER_SITE_OTHER): Payer: Managed Care, Other (non HMO)

## 2013-05-05 DIAGNOSIS — Z23 Encounter for immunization: Secondary | ICD-10-CM

## 2013-08-09 ENCOUNTER — Other Ambulatory Visit: Payer: Self-pay | Admitting: Internal Medicine

## 2013-08-17 ENCOUNTER — Telehealth: Payer: Self-pay | Admitting: Internal Medicine

## 2013-08-17 MED ORDER — METOPROLOL TARTRATE 25 MG PO TABS: 25.0000 mg | ORAL_TABLET | Freq: Two times a day (BID) | ORAL | Status: DC

## 2013-08-17 MED ORDER — FUROSEMIDE 40 MG PO TABS: 40.0000 mg | ORAL_TABLET | Freq: Every day | ORAL | Status: DC

## 2013-08-17 NOTE — Telephone Encounter (Signed)
Pt request flusemide and metoprolol to be send into [facility]. Please advise.

## 2013-08-26 ENCOUNTER — Ambulatory Visit (INDEPENDENT_AMBULATORY_CARE_PROVIDER_SITE_OTHER): Payer: Managed Care, Other (non HMO) | Admitting: Family Medicine

## 2013-08-26 ENCOUNTER — Encounter: Payer: Self-pay | Admitting: Family Medicine

## 2013-08-26 VITALS — BP 180/80 | Temp 97.7°F | Wt 182.0 lb

## 2013-08-26 DIAGNOSIS — E785 Hyperlipidemia, unspecified: Secondary | ICD-10-CM

## 2013-08-26 DIAGNOSIS — Z7689 Persons encountering health services in other specified circumstances: Secondary | ICD-10-CM

## 2013-08-26 DIAGNOSIS — M79609 Pain in unspecified limb: Secondary | ICD-10-CM

## 2013-08-26 DIAGNOSIS — E119 Type 2 diabetes mellitus without complications: Secondary | ICD-10-CM

## 2013-08-26 DIAGNOSIS — I10 Essential (primary) hypertension: Secondary | ICD-10-CM

## 2013-08-26 DIAGNOSIS — M79606 Pain in leg, unspecified: Secondary | ICD-10-CM

## 2013-08-26 DIAGNOSIS — Z7189 Other specified counseling: Secondary | ICD-10-CM

## 2013-08-26 NOTE — Patient Instructions (Signed)
We recommend the following healthy lifestyle measures: - eat a healthy diet consisting of lots of vegetables, fruits, beans, nuts, seeds, healthy meats such as white chicken and fish and whole grains.  - avoid fried foods, fast food, processed foods, sodas, red meet and other fattening foods.  - get a least 150 minutes of aerobic exercise per week.   Heat on muscles for 15 minutes once daily  Exercises provided 4-5 times per week and regular walking or gentle exercise  Follow up in: 1 month for physical and leg pain

## 2013-08-26 NOTE — Progress Notes (Signed)
Chief Complaint  Patient presents with  . Establish Care    HPI:  Brandon Santos is here to establish care.  Last PCP and physical:  Has the following chronic problems and concerns today:  Patient Active Problem List   Diagnosis Date Noted  . Routine health maintenance 08/09/2011  . POLYP, COLON 03/22/2007  . DIABETES MELLITUS 03/22/2007  . HYPERLIPIDEMIA 03/22/2007  . HYPERTENSION 03/22/2007  . HEMORRHOIDS 03/22/2007  . BENIGN PROSTATIC HYPERTROPHY, HX OF 03/22/2007  . APPENDECTOMY, HX OF 03/22/2007   Pain in L buttock: -started a few months ago -mostly on L, intermittent, aggravated by bending and coming down steps, occ on R, not at night or sitting, mod pain 5/10 at worse, achy -denies: falls, fevers, malaise, weakness, numbness, bowel or bladder incontinence  HTN: -on lasix (no heart failure or edema), lisinorpil, metoprolol  DM: On metformin and amaryl  HLD: -on statin  Health Maintenance: -utd - is going to schedule phsycal ROS: See pertinent positives and negatives per HPI.  Past Medical History  Diagnosis Date  . Hypertension   . Diabetes mellitus without complication     Family History  Problem Relation Age of Onset  . Colon cancer Neg Hx   . Heart disease Father     History   Social History  . Marital Status: Married    Spouse Name: N/A    Number of Children: N/A  . Years of Education: N/A   Social History Main Topics  . Smoking status: Never Smoker   . Smokeless tobacco: Never Used  . Alcohol Use: No     Comment: only drinks at Party/Holidays  . Drug Use: No  . Sexual Activity: None   Other Topics Concern  . None   Social History Narrative   Work or School: retired Data processing manager Situation: lives with wife      Spiritual Beliefs: none; believes in God      Lifestyle: not regular exercise; diet is good             Current outpatient prescriptions:ascorbic acid (VITAMIN C) 500 MG tablet, Take 500 mg by mouth  daily. With [employer], Disp: , Rfl: ;  aspirin EC 81 MG tablet, Take 81 mg by mouth daily., Disp: , Rfl: ;  B Complex Vitamins (B COMPLEX 50) TABS, Take 1 tablet by mouth daily. , Disp: , Rfl: ;  Calcium-Vitamin D (CALTRATE 600 PLUS-VIT D PO), Take by mouth daily., Disp: , Rfl:  Cholecalciferol 1000 UNITS capsule, Take 1,000 Units by mouth daily., Disp: , Rfl: ;  furosemide (LASIX) 40 MG tablet, Take 1 tablet (40 mg total) by mouth daily., Disp: 90 tablet, Rfl: 3;  glimepiride (AMARYL) 2 MG tablet, Take 1 mg by mouth 2 (two) times daily., Disp: , Rfl: ;  glimepiride (AMARYL) 2 MG tablet, TAKE 1/2 TABLET (1MG  TOTAL)EVERY MORNING, Disp: 45 tablet, Rfl: 3 lisinopril (PRINIVIL,ZESTRIL) 40 MG tablet, Take 40 mg by mouth every morning., Disp: , Rfl: ;  lisinopril (PRINIVIL,ZESTRIL) 40 MG tablet, TAKE 1 TABLET DAILY, Disp: 90 tablet, Rfl: 3;  metFORMIN (GLUCOPHAGE) 1000 MG tablet, Take 1,000 mg by mouth 2 (two) times daily with a meal., Disp: , Rfl: ;  metFORMIN (GLUCOPHAGE) 1000 MG tablet, TAKE 1 TABLET TWICE A DAY  WITH MEALS, Disp: 180 tablet, Rfl: 3 metoprolol tartrate (LOPRESSOR) 25 MG tablet, Take 1 tablet (25 mg total) by mouth 2 (two) times daily., Disp: 180 tablet, Rfl: 3;  Multiple Vitamins-Minerals (  CENTRUM PO), Take 1 tablet by mouth daily. , Disp: , Rfl: ;  simvastatin (ZOCOR) 20 MG tablet, Take 20 mg by mouth every evening., Disp: , Rfl: ;  vitamin E 400 UNIT capsule, Take 400 Units by mouth daily., Disp: , Rfl:   EXAM:  Filed Vitals:   08/26/13 0942  BP: 180/80  Temp: 97.7 F (36.5 C)    Body mass index is 29.39 kg/(m^2).  GENERAL: vitals reviewed and listed above, alert, oriented, appears well hydrated and in no acute distress  HEENT: atraumatic, conjunttiva clear, no obvious abnormalities on inspection of external nose and ears  NECK: no obvious masses on inspection  LUNGS: clear to auscultation bilaterally, no wheezes, rales or rhonchi, good air movement  CV: HRRR, no peripheral  edema  MS/NEURO: moves all extremities without noticeable abnormality Normal Gait Normal inspection of back, no obvious scoliosis or leg length descrepancy No bony TTP Soft tissue TTP at: IT bands bilat with tight hamstrings and IT bands -/+ tests: neg trendelenburg,-facet loading, -SLRT, -CLRT, -FABER, -FADIR Normal muscle strength, sensation to light touch and DTRs in LEs bilaterally  PSYCH: pleasant and cooperative, no obvious depression or anxiety  ASSESSMENT AND PLAN:  Discussed the following assessment and plan:  Leg pain -we discussed possible serious and likely etiologies, workup and treatment, treatment risks and ed Kiing  to return or notify a doctor immediately if symptoms worsen or persist or new concerns arise. -likely muscular or possible radicular -HEP , heat, tyelenol prn in safe dosing range, follow up 1 month  [place]  Encounter to establish care with new doctor -We reviewed the PMH, PSH, FH, SH, Meds and Allergies. -We provided refills for any medications we will prescribe as needed. -We addressed current concerns per orders and patient instructions. -We have asked for records for pertinent exams, studies, vaccines and notes from previous providers. -We have advised patient to follow up per instructions below. -he wishes to do labs at physical  -Patient advised to return or notify a doctor immediately if symptoms worsen or persist or new concerns arise.  Patient Instructions  We recommend the following healthy lifestyle measures: - eat a healthy diet consisting of lots of vegetables, fruits, beans, nuts, seeds, healthy meats such as white chicken and fish and whole grains.  - avoid fried foods, fast food, processed foods, sodas, red meet and other fattening foods.  - get a least 150 minutes of aerobic exercise per week.   Heat on muscles for 15 minutes once daily  Exercises provided 4-5 times per week and regular  walking or gentle exercise  Follow up in: 1 month for physical and leg pain      KIM, HANNAH R.

## 2013-08-28 ENCOUNTER — Telehealth: Payer: Self-pay

## 2013-08-28 NOTE — Telephone Encounter (Signed)
Relevant patient education assigned to patient using Emmi. ° °

## 2013-09-09 ENCOUNTER — Ambulatory Visit: Payer: Managed Care, Other (non HMO) | Admitting: Family Medicine

## 2013-09-30 ENCOUNTER — Encounter: Payer: Self-pay | Admitting: Family Medicine

## 2013-09-30 ENCOUNTER — Telehealth: Payer: Self-pay | Admitting: Family Medicine

## 2013-09-30 ENCOUNTER — Ambulatory Visit (INDEPENDENT_AMBULATORY_CARE_PROVIDER_SITE_OTHER): Payer: Managed Care, Other (non HMO) | Admitting: Family Medicine

## 2013-09-30 VITALS — BP 118/80 | Temp 98.1°F | Wt 182.0 lb

## 2013-09-30 DIAGNOSIS — IMO0002 Reserved for concepts with insufficient information to code with codable children: Secondary | ICD-10-CM

## 2013-09-30 DIAGNOSIS — I10 Essential (primary) hypertension: Secondary | ICD-10-CM

## 2013-09-30 DIAGNOSIS — IMO0001 Reserved for inherently not codable concepts without codable children: Secondary | ICD-10-CM

## 2013-09-30 DIAGNOSIS — M7918 Myalgia, other site: Secondary | ICD-10-CM

## 2013-09-30 DIAGNOSIS — E119 Type 2 diabetes mellitus without complications: Secondary | ICD-10-CM

## 2013-09-30 DIAGNOSIS — E785 Hyperlipidemia, unspecified: Secondary | ICD-10-CM

## 2013-09-30 DIAGNOSIS — M79659 Pain in unspecified thigh: Secondary | ICD-10-CM

## 2013-09-30 DIAGNOSIS — Z0000 Encounter for general adult medical examination without abnormal findings: Secondary | ICD-10-CM

## 2013-09-30 DIAGNOSIS — M79609 Pain in unspecified limb: Secondary | ICD-10-CM

## 2013-09-30 DIAGNOSIS — M5410 Radiculopathy, site unspecified: Secondary | ICD-10-CM

## 2013-09-30 LAB — BASIC METABOLIC PANEL
BUN: 23 mg/dL (ref 6–23)
CHLORIDE: 104 meq/L (ref 96–112)
CO2: 29 mEq/L (ref 19–32)
Calcium: 9.4 mg/dL (ref 8.4–10.5)
Creatinine, Ser: 1.1 mg/dL (ref 0.4–1.5)
GFR: 66.14 mL/min (ref >60.00)
GLUCOSE: 94 mg/dL (ref 70–99)
POTASSIUM: 4.3 meq/L (ref 3.5–5.1)
SODIUM: 140 meq/L (ref 135–145)

## 2013-09-30 LAB — LIPID PANEL
Cholesterol: 140 mg/dL (ref 0–200)
HDL: 46.3 mg/dL (ref >39.00)
LDL Cholesterol: 61 mg/dL (ref 0–99)
Total CHOL/HDL Ratio: 3
Triglycerides: 164 mg/dL — ABNORMAL HIGH (ref 0.0–149.0)
VLDL: 32.8 mg/dL (ref 0.0–40.0)

## 2013-09-30 LAB — MICROALBUMIN / CREATININE URINE RATIO
Creatinine,U: 190.7 mg/dL
MICROALB UR: 1.6 mg/dL (ref 0.0–1.9)
MICROALB/CREAT RATIO: 0.8 mg/g (ref 0.0–30.0)

## 2013-09-30 LAB — HEMOGLOBIN A1C: HEMOGLOBIN A1C: 5.9 % (ref 4.6–6.5)

## 2013-09-30 LAB — HM DIABETES EYE EXAM

## 2013-09-30 NOTE — Patient Instructions (Signed)
-  We have ordered labs or studies at this visit. It can take up to 1-2 weeks for results and processing. We will contact you with instructions IF your results are abnormal. Normal results will be released to your [facility]. If you have not heard from [place] or can not find your results in [facility] in 2 weeks please contact our office.  -We placed a referral for you as discussed for your leg pain. It usually takes about 1-2 weeks to process and schedule this referral. If you have not heard from [place] regarding this appointment in 2 weeks please contact our office.   -PLEASE SIGN UP FOR MYCHART TODAY   We recommend the following healthy lifestyle measures: - eat a healthy diet consisting of lots of vegetables, fruits, beans, nuts, seeds, healthy meats such as white chicken and fish and whole grains.  - avoid fried foods, fast food, processed foods, sodas, red meet and other fattening foods.  - get a least 150 minutes of aerobic exercise per week.   Follow up in: in 4-6 months

## 2013-09-30 NOTE — Telephone Encounter (Signed)
Relevant patient education assigned to patient using Emmi. ° °

## 2013-09-30 NOTE — Progress Notes (Signed)
Chief Complaint  Patient presents with  . Annual Exam  . Follow-up    HPI:    HTN: -elevated BP last visit -great today  L buttock Pain: -reports: he has been doing the exercises and heat and no better and sometimes on R too -denies: fevers, weight loss, malaise, inability to bear weight, weakness, numbness, bowel or bladder problems -wants to see specialist  DM/HLD: -check labs today -reports BS at home runs in low 100s -denies: tingling in feet, ulcers, polyuria, polydipsia -getting eye exam tomorrow -no regular exercise; diet is healthy, low carb   ROS: See pertinent positives and negatives per HPI. Review of Systems  Constitutional: Negative for weight loss and malaise/fatigue.  HENT: Negative for hearing loss.   Eyes: Negative for blurred vision and double vision.  Respiratory: Negative for cough and shortness of breath.   Cardiovascular: Negative for chest pain, palpitations and leg swelling.  Gastrointestinal: Negative for diarrhea, constipation, blood in stool and melena.  Genitourinary: Negative for dysuria and urgency.  Musculoskeletal: Negative for falls.  Skin: Negative for rash.  Neurological: Negative for dizziness, seizures and headaches.  Endo/Heme/Allergies: Does not bruise/bleed easily.  Psychiatric/Behavioral: Negative for depression and memory loss.     Past Medical History  Diagnosis Date  . Hypertension   . Diabetes mellitus without complication     Past Surgical History  Procedure Laterality Date  . Appendectomy  1966  . Colonoscopy  2013    had it close to end of year, exam was normal    Family History  Problem Relation Age of Onset  . Colon cancer Neg Hx   . Heart disease Father     History   Social History  . Marital Status: Married    Spouse Name: N/A    Number of Children: N/A  . Years of Education: N/A   Social History Main Topics  . Smoking status: Never Smoker   . Smokeless tobacco: Never Used  . Alcohol Use: No      Comment: only drinks at Party/Holidays  . Drug Use: No  . Sexual Activity: None   Other Topics Concern  . None   Social History Narrative   Work or School: retired Data processing manager Situation: lives with wife      Spiritual Beliefs: none; believes in God      Lifestyle: not regular exercise; diet is good             Current outpatient prescriptions:ascorbic acid (VITAMIN C) 500 MG tablet, Take 500 mg by mouth daily. With [employer], Disp: , Rfl: ;  aspirin EC 81 MG tablet, Take 81 mg by mouth daily., Disp: , Rfl: ;  B Complex Vitamins (B COMPLEX 50) TABS, Take 1 tablet by mouth daily. , Disp: , Rfl: ;  Calcium-Vitamin D (CALTRATE 600 PLUS-VIT D PO), Take by mouth daily., Disp: , Rfl:  Cholecalciferol 1000 UNITS capsule, Take 1,000 Units by mouth daily., Disp: , Rfl: ;  furosemide (LASIX) 40 MG tablet, Take 1 tablet (40 mg total) by mouth daily., Disp: 90 tablet, Rfl: 3;  glimepiride (AMARYL) 2 MG tablet, Take 1 mg by mouth 2 (two) times daily., Disp: , Rfl: ;  glimepiride (AMARYL) 2 MG tablet, TAKE 1/2 TABLET (1MG  TOTAL)EVERY MORNING, Disp: 45 tablet, Rfl: 3 lisinopril (PRINIVIL,ZESTRIL) 40 MG tablet, Take 40 mg by mouth every morning., Disp: , Rfl: ;  lisinopril (PRINIVIL,ZESTRIL) 40 MG tablet, TAKE 1 TABLET DAILY, Disp: 90 tablet, Rfl:  3;  metFORMIN (GLUCOPHAGE) 1000 MG tablet, Take 1,000 mg by mouth 2 (two) times daily with a meal., Disp: , Rfl: ;  metFORMIN (GLUCOPHAGE) 1000 MG tablet, TAKE 1 TABLET TWICE A DAY  WITH MEALS, Disp: 180 tablet, Rfl: 3 metoprolol tartrate (LOPRESSOR) 25 MG tablet, Take 1 tablet (25 mg total) by mouth 2 (two) times daily., Disp: 180 tablet, Rfl: 3;  Multiple Vitamins-Minerals (CENTRUM PO), Take 1 tablet by mouth daily. , Disp: , Rfl: ;  simvastatin (ZOCOR) 20 MG tablet, Take 20 mg by mouth every evening., Disp: , Rfl: ;  vitamin E 400 UNIT capsule, Take 400 Units by mouth daily., Disp: , Rfl:   EXAM:  Filed Vitals:   09/30/13 0808  BP:  118/80  Temp: 98.1 F (36.7 C)    Body mass index is 29.39 kg/(m^2).  GENERAL: vitals reviewed and listed above, alert, oriented, appears well hydrated and in no acute distress  HEENT: atraumatic, conjunttiva clear, no obvious abnormalities on inspection of external nose and ears  NECK: no obvious masses on inspection  LUNGS: clear to auscultation bilaterally, no wheezes, rales or rhonchi, good air movement  CV: HRRR, no peripheral edema  MS: moves all extremities without noticeable abnormality  PSYCH: pleasant and cooperative, no obvious depression or anxiety  FOOT EXAM: see chart ASSESSMENT AND PLAN:  Discussed the following assessment and plan:  Routine health maintenance  Gluteal pain - Plan: Ambulatory referral to Physical Medicine Rehab  Possible Lumbar Radiculopathy - Plan: Ambulatory referral to Physical Medicine Rehab  Upper leg pain - Plan: Ambulatory referral to Physical Medicine Rehab  DIABETES MELLITUS - Plan: Hemoglobin A1c, Microalbumin/Creatinine Ratio, Urine  HYPERLIPIDEMIA - Plan: Lipid Panel  HYPERTENSION - Plan: Basic metabolic panel  -Patient advised to return or notify a doctor immediately if symptoms worsen or persist or new concerns arise.  Patient Instructions  -We have ordered labs or studies at this visit. It can take up to 1-2 weeks for results and processing. We will contact you with instructions IF your results are abnormal. Normal results will be released to your [facility]. If you have not heard from [place] or can not find your results in [facility] in 2 weeks please contact our office.  -We placed a referral for you as discussed for your leg pain. It usually takes about 1-2 weeks to process and schedule this referral. If you have not heard from [place] regarding this appointment in 2 weeks please contact our office.   -PLEASE SIGN UP FOR MYCHART TODAY   We recommend the following healthy lifestyle measures: - eat a healthy diet consisting of lots  of vegetables, fruits, beans, nuts, seeds, healthy meats such as white chicken and fish and whole grains.  - avoid fried foods, fast food, processed foods, sodas, red meet and other fattening foods.  - get a least 150 minutes of aerobic exercise per week.   Follow up in: in 4-6 months      Lucretia Kern

## 2013-10-07 ENCOUNTER — Ambulatory Visit (INDEPENDENT_AMBULATORY_CARE_PROVIDER_SITE_OTHER): Payer: Managed Care, Other (non HMO) | Admitting: Ophthalmology

## 2013-10-07 DIAGNOSIS — E1139 Type 2 diabetes mellitus with other diabetic ophthalmic complication: Secondary | ICD-10-CM

## 2013-10-07 DIAGNOSIS — H35039 Hypertensive retinopathy, unspecified eye: Secondary | ICD-10-CM

## 2013-10-07 DIAGNOSIS — H2510 Age-related nuclear cataract, unspecified eye: Secondary | ICD-10-CM

## 2013-10-07 DIAGNOSIS — E1165 Type 2 diabetes mellitus with hyperglycemia: Secondary | ICD-10-CM

## 2013-10-07 DIAGNOSIS — E11319 Type 2 diabetes mellitus with unspecified diabetic retinopathy without macular edema: Secondary | ICD-10-CM

## 2013-10-07 DIAGNOSIS — I10 Essential (primary) hypertension: Secondary | ICD-10-CM

## 2013-10-07 DIAGNOSIS — H43819 Vitreous degeneration, unspecified eye: Secondary | ICD-10-CM

## 2013-10-09 ENCOUNTER — Encounter: Payer: Self-pay | Admitting: Family Medicine

## 2013-11-02 ENCOUNTER — Telehealth: Payer: Self-pay | Admitting: Family Medicine

## 2013-11-02 MED ORDER — SIMVASTATIN 20 MG PO TABS: 20.0000 mg | ORAL_TABLET | Freq: Every evening | ORAL | Status: DC

## 2013-11-02 NOTE — Telephone Encounter (Signed)
Rx for Simvastatin was not written by you.  Would you lke to refill this for the pt?

## 2013-11-02 NOTE — Telephone Encounter (Signed)
Sure. Sent 1 year.

## 2013-11-02 NOTE — Telephone Encounter (Signed)
Pt request refill simvastatin (ZOCOR) 20 MG tablet [facility]  90 day w/ refills  [facility]   [phone]

## 2013-11-06 ENCOUNTER — Encounter: Payer: Self-pay | Admitting: Family Medicine

## 2013-12-28 ENCOUNTER — Other Ambulatory Visit: Payer: Self-pay | Admitting: Neurological Surgery

## 2014-01-13 ENCOUNTER — Other Ambulatory Visit (HOSPITAL_COMMUNITY): Payer: Self-pay | Admitting: *Deleted

## 2014-01-13 NOTE — Pre-Procedure Instructions (Signed)
Brandon Santos  01/13/2014   Your procedure is scheduled on:  Thursday, January 21, 2014 at 7:30 AM.   Report to [facility] Entrance "A" Admitting Office at 5:30 AM.   Call this number if you have problems the morning of surgery: [phone]   Remember:   Do not eat food or drink liquids after midnight Wednesday, 01/20/14.   Take these medicines the morning of surgery with A SIP OF WATER: Lopressor (metoptolol tartrate)   Stop taking Aspirin, Aleve, Ibuprofen, BC's, Goody's, Herbal medications, Fish Oil, Vitamins   Do not wear jewelry.  Do not wear lotions, powders, or cologne. You may wear deodorant.  Men may shave face and neck.  Do not bring valuables to the hospital.  [facility] is not responsible                  for any belongings or valuables.               Contacts, dentures or bridgework may not be worn into surgery.  Leave suitcase in the car. After surgery it may be brought to your room.  For patients admitted to the hospital, discharge time is determined by your                treatment team.               Special Instructions: [place] - Preparing for Surgery  Before surgery, you can play an important role.  Because skin is not sterile, your skin needs to be as free of germs as possible.  You can reduce the number of germs on you skin by washing with CHG (chlorahexidine gluconate) soap before surgery.  CHG is an antiseptic cleaner which kills germs and bonds with the skin to continue killing germs even after washing.  Please DO NOT use if you have an allergy to CHG or antibacterial soaps.  If your skin becomes reddened/irritated stop using the CHG and inform your nurse when you arrive at Short Stay.  Do not shave (including legs and underarms) for at least 48 hours prior to the first CHG shower.  You may shave your face.  Please follow these instructions carefully:   1.  Shower with CHG Soap the night before surgery and the                                 morning of Surgery.  2.  If you choose to wash your hair, wash your hair first as usual with your       normal shampoo.  3.  After you shampoo, rinse your hair and body thoroughly to remove the                      Shampoo.  4.  Use CHG as you would any other liquid soap.  You can apply chg directly       to the skin and wash gently with scrungie or a clean washcloth.  5.  Apply the CHG Soap to your body ONLY FROM THE NECK DOWN.        Do not use on open wounds or open sores.  Avoid contact with your eyes, ears, mouth and genitals (private parts).  Wash genitals (private parts) with your normal soap.  6.  Wash thoroughly, paying special attention to the area where your surgery  will be performed.  7.  Thoroughly rinse your body with warm water from the neck down.  8.  DO NOT shower/wash with your normal soap after using and rinsing off       the CHG Soap.  9.  Pat yourself dry with a clean towel.            10.  Wear clean pajamas.            11.  Place clean sheets on your bed the night of your first shower and do not        sleep with pets.  Day of Surgery  Do not apply any lotions the morning of surgery.  Please wear clean clothes to the hospital/surgery center.     Please read over the following fact sheets that you were given: Pain Booklet, Coughing and Deep Breathing, Blood Transfusion Information, MRSA Information and Surgical Site Infection Prevention

## 2014-01-14 ENCOUNTER — Ambulatory Visit (HOSPITAL_COMMUNITY)
Admission: RE | Admit: 2014-01-14 | Discharge: 2014-01-14 | Disposition: A | Discharge status: Home / Self Care | Payer: Managed Care, Other (non HMO) | Source: Ambulatory Visit | Attending: Anesthesiology | Admitting: Anesthesiology

## 2014-01-14 ENCOUNTER — Encounter (HOSPITAL_COMMUNITY): Payer: Self-pay

## 2014-01-14 ENCOUNTER — Encounter (HOSPITAL_COMMUNITY)
Admission: RE | Admit: 2014-01-14 | Discharge: 2014-01-14 | Disposition: A | Discharge status: Home / Self Care | Payer: Managed Care, Other (non HMO) | Source: Ambulatory Visit | Attending: Neurological Surgery | Admitting: Neurological Surgery

## 2014-01-14 ENCOUNTER — Encounter (HOSPITAL_COMMUNITY): Payer: Self-pay | Admitting: Pharmacy Technician

## 2014-01-14 DIAGNOSIS — I10 Essential (primary) hypertension: Secondary | ICD-10-CM | POA: Insufficient documentation

## 2014-01-14 DIAGNOSIS — Z01818 Encounter for other preprocedural examination: Secondary | ICD-10-CM | POA: Insufficient documentation

## 2014-01-14 DIAGNOSIS — Z01810 Encounter for preprocedural cardiovascular examination: Secondary | ICD-10-CM | POA: Insufficient documentation

## 2014-01-14 LAB — BASIC METABOLIC PANEL
Anion gap: 11 (ref 5–15)
BUN: 26 mg/dL — AB (ref 6–23)
CHLORIDE: 105 meq/L (ref 96–112)
CO2: 26 meq/L (ref 19–32)
Calcium: 9.4 mg/dL (ref 8.4–10.5)
Creatinine, Ser: 1.23 mg/dL (ref 0.50–1.35)
GFR calc Af Amer: 64 mL/min — ABNORMAL LOW (ref >90)
GFR calc non Af Amer: 55 mL/min — ABNORMAL LOW (ref >90)
Glucose, Bld: 79 mg/dL (ref 70–99)
Potassium: 5 mEq/L (ref 3.7–5.3)
Sodium: 142 mEq/L (ref 137–147)

## 2014-01-14 LAB — CBC
HEMATOCRIT: 40.4 % (ref 39.0–52.0)
HEMOGLOBIN: 13.5 g/dL (ref 13.0–17.0)
MCH: 30.1 pg (ref 26.0–34.0)
MCHC: 33.4 g/dL (ref 30.0–36.0)
MCV: 90 fL (ref 78.0–100.0)
Platelets: 220 10*3/uL (ref 150–400)
RBC: 4.49 MIL/uL (ref 4.22–5.81)
RDW: 13.6 % (ref 11.5–15.5)
WBC: 9.9 10*3/uL (ref 4.0–10.5)

## 2014-01-14 LAB — TYPE AND SCREEN
ABO/RH(D): B POS
Antibody Screen: NEGATIVE

## 2014-01-14 LAB — ABO/RH: ABO/RH(D): B POS

## 2014-01-14 LAB — SURGICAL PCR SCREEN
MRSA, PCR: NEGATIVE
STAPHYLOCOCCUS AUREUS: NEGATIVE

## 2014-01-14 NOTE — Progress Notes (Signed)
PCP is Dr Lucretia Kern States that he saw a cardiologist from [place] years ago, but doesn't remember his name. Stress test noted in epic from 2006. Denies ever having a card cath or echo. Denies having a recent CXR or EKG. States that his fasting CBG's are usually in the 90's.

## 2014-01-14 NOTE — Progress Notes (Signed)
01/14/14 0903  OBSTRUCTIVE SLEEP APNEA  Have you ever been diagnosed with sleep apnea through a sleep study? No  Do you snore loudly (loud enough to be heard through closed doors)?  0  Do you often feel tired, fatigued, or sleepy during the daytime? 0  Has anyone observed you stop breathing during your sleep? 0  Do you have, or are you being treated for high blood pressure? 1  BMI more than 35 kg/m2? 0  Age over 78 years old? 1  Neck circumference greater than 40 cm/16 inches? 1  Gender: 1  Obstructive Sleep Apnea Score 4  Score 4 or greater  Results sent to PCP

## 2014-01-20 MED ORDER — CEFAZOLIN SODIUM-DEXTROSE 2-3 GM-% IV SOLR
2.0000 g | INTRAVENOUS | Status: AC
  Administered 2014-01-21: 2 g via INTRAVENOUS
  Filled 2014-01-20: qty 50

## 2014-01-21 ENCOUNTER — Inpatient Hospital Stay (HOSPITAL_COMMUNITY)
Admission: RE | Admit: 2014-01-21 | Discharge: 2014-01-22 | DRG: 460 | Disposition: A | Discharge status: Home / Self Care | Payer: Managed Care, Other (non HMO) | Source: Ambulatory Visit | Attending: Neurological Surgery | Admitting: Neurological Surgery

## 2014-01-21 ENCOUNTER — Inpatient Hospital Stay (HOSPITAL_COMMUNITY): Payer: Managed Care, Other (non HMO) | Admitting: Anesthesiology

## 2014-01-21 ENCOUNTER — Inpatient Hospital Stay (HOSPITAL_COMMUNITY): Payer: Managed Care, Other (non HMO)

## 2014-01-21 ENCOUNTER — Encounter (HOSPITAL_COMMUNITY): Payer: Managed Care, Other (non HMO) | Admitting: Anesthesiology

## 2014-01-21 ENCOUNTER — Encounter (HOSPITAL_COMMUNITY): Payer: Self-pay | Admitting: Neurological Surgery

## 2014-01-21 ENCOUNTER — Encounter (HOSPITAL_COMMUNITY)
Admission: RE | Disposition: A | Discharge status: Home / Self Care | Payer: Self-pay | Source: Ambulatory Visit | Attending: Neurological Surgery

## 2014-01-21 DIAGNOSIS — Z981 Arthrodesis status: Secondary | ICD-10-CM

## 2014-01-21 DIAGNOSIS — E119 Type 2 diabetes mellitus without complications: Secondary | ICD-10-CM | POA: Diagnosis present

## 2014-01-21 DIAGNOSIS — M48062 Spinal stenosis, lumbar region with neurogenic claudication: Principal | ICD-10-CM | POA: Diagnosis present

## 2014-01-21 DIAGNOSIS — I10 Essential (primary) hypertension: Secondary | ICD-10-CM | POA: Diagnosis present

## 2014-01-21 DIAGNOSIS — Z7982 Long term (current) use of aspirin: Secondary | ICD-10-CM

## 2014-01-21 HISTORY — PX: MAXIMUM ACCESS (MAS)POSTERIOR LUMBAR INTERBODY FUSION (PLIF) 1 LEVEL: SHX6368

## 2014-01-21 LAB — GLUCOSE, CAPILLARY
GLUCOSE-CAPILLARY: 101 mg/dL — AB (ref 70–99)
Glucose-Capillary: 88 mg/dL (ref 70–99)
Glucose-Capillary: 101 mg/dL — ABNORMAL HIGH (ref 70–99)
Glucose-Capillary: 104 mg/dL — ABNORMAL HIGH (ref 70–99)
Glucose-Capillary: 188 mg/dL — ABNORMAL HIGH (ref 70–99)

## 2014-01-21 SURGERY — FOR MAXIMUM ACCESS (MAS) POSTERIOR LUMBAR INTERBODY FUSION (PLIF) 1 LEVEL
Anesthesia: General | Site: Spine Lumbar

## 2014-01-21 MED ORDER — FENTANYL CITRATE 0.05 MG/ML IJ SOLN
INTRAMUSCULAR | Status: AC
  Administered 2014-01-21: 50 ug via INTRAVENOUS
  Filled 2014-01-21: qty 2

## 2014-01-21 MED ORDER — FUROSEMIDE 40 MG PO TABS
40.0000 mg | ORAL_TABLET | Freq: Every day | ORAL | Status: DC
  Administered 2014-01-22: 40 mg via ORAL
  Filled 2014-01-21: qty 1

## 2014-01-21 MED ORDER — FENTANYL CITRATE 0.05 MG/ML IJ SOLN
25.0000 ug | INTRAMUSCULAR | Status: DC | PRN
  Administered 2014-01-21 (×2): 50 ug via INTRAVENOUS

## 2014-01-21 MED ORDER — FENTANYL CITRATE 0.05 MG/ML IJ SOLN
INTRAMUSCULAR | Status: DC | PRN
  Administered 2014-01-21 (×2): 50 ug via INTRAVENOUS
  Administered 2014-01-21: 100 ug via INTRAVENOUS

## 2014-01-21 MED ORDER — MENTHOL 3 MG MT LOZG: 1.0000 | LOZENGE | OROMUCOSAL | Status: DC | PRN

## 2014-01-21 MED ORDER — ONDANSETRON HCL 4 MG/2ML IJ SOLN
4.0000 mg | Freq: Once | INTRAMUSCULAR | Status: DC | PRN
Start: 2014-01-21 — End: 2014-01-21

## 2014-01-21 MED ORDER — SUCCINYLCHOLINE CHLORIDE 20 MG/ML IJ SOLN
INTRAMUSCULAR | Status: DC | PRN
  Administered 2014-01-21: 100 mg via INTRAVENOUS

## 2014-01-21 MED ORDER — THROMBIN 20000 UNITS EX SOLR
CUTANEOUS | Status: DC | PRN
  Administered 2014-01-21: 09:00:00 via TOPICAL

## 2014-01-21 MED ORDER — HYDROMORPHONE HCL PF 1 MG/ML IJ SOLN: 0.2500 mg | INTRAMUSCULAR | Status: DC | PRN

## 2014-01-21 MED ORDER — LISINOPRIL 40 MG PO TABS
40.0000 mg | ORAL_TABLET | Freq: Every morning | ORAL | Status: DC
  Administered 2014-01-22: 40 mg via ORAL
  Filled 2014-01-21: qty 1

## 2014-01-21 MED ORDER — CEFAZOLIN SODIUM 1-5 GM-% IV SOLN
1.0000 g | Freq: Three times a day (TID) | INTRAVENOUS | Status: AC
  Administered 2014-01-21 – 2014-01-22 (×2): 1 g via INTRAVENOUS
  Filled 2014-01-21 (×2): qty 50

## 2014-01-21 MED ORDER — METHOCARBAMOL 1000 MG/10ML IJ SOLN
500.0000 mg | Freq: Four times a day (QID) | INTRAVENOUS | Status: DC | PRN
  Filled 2014-01-21: qty 5

## 2014-01-21 MED ORDER — ONDANSETRON HCL 4 MG/2ML IJ SOLN: 4.0000 mg | INTRAMUSCULAR | Status: DC | PRN

## 2014-01-21 MED ORDER — ROCURONIUM BROMIDE 50 MG/5ML IV SOLN
INTRAVENOUS | Status: AC
  Filled 2014-01-21: qty 1

## 2014-01-21 MED ORDER — POTASSIUM CHLORIDE IN NACL 20-0.9 MEQ/L-% IV SOLN
INTRAVENOUS | Status: DC
  Filled 2014-01-21 (×4): qty 1000

## 2014-01-21 MED ORDER — SENNA 8.6 MG PO TABS
1.0000 | ORAL_TABLET | Freq: Two times a day (BID) | ORAL | Status: DC
  Administered 2014-01-21 – 2014-01-22 (×2): 8.6 mg via ORAL
  Filled 2014-01-21 (×3): qty 1

## 2014-01-21 MED ORDER — MIDAZOLAM HCL 2 MG/2ML IJ SOLN
INTRAMUSCULAR | Status: AC
  Filled 2014-01-21: qty 2

## 2014-01-21 MED ORDER — ARTIFICIAL TEARS OP OINT
TOPICAL_OINTMENT | OPHTHALMIC | Status: DC | PRN
  Administered 2014-01-21: 1 via OPHTHALMIC

## 2014-01-21 MED ORDER — OXYCODONE-ACETAMINOPHEN 5-325 MG PO TABS
1.0000 | ORAL_TABLET | ORAL | Status: DC | PRN
  Administered 2014-01-21: 2 via ORAL
  Administered 2014-01-22: 1 via ORAL
  Administered 2014-01-22: 2 via ORAL
  Filled 2014-01-21: qty 1
  Filled 2014-01-21 (×2): qty 2

## 2014-01-21 MED ORDER — LIDOCAINE HCL (CARDIAC) 20 MG/ML IV SOLN
INTRAVENOUS | Status: DC | PRN
  Administered 2014-01-21: 60 mg via INTRAVENOUS

## 2014-01-21 MED ORDER — FENTANYL CITRATE 0.05 MG/ML IJ SOLN
INTRAMUSCULAR | Status: AC
  Filled 2014-01-21: qty 5

## 2014-01-21 MED ORDER — ONDANSETRON HCL 4 MG/2ML IJ SOLN
INTRAMUSCULAR | Status: DC | PRN
  Administered 2014-01-21: 4 mg via INTRAVENOUS

## 2014-01-21 MED ORDER — PHENOL 1.4 % MT LIQD: 1.0000 | OROMUCOSAL | Status: DC | PRN

## 2014-01-21 MED ORDER — EPHEDRINE SULFATE 50 MG/ML IJ SOLN
INTRAMUSCULAR | Status: AC
  Filled 2014-01-21: qty 1

## 2014-01-21 MED ORDER — SUCCINYLCHOLINE CHLORIDE 20 MG/ML IJ SOLN
INTRAMUSCULAR | Status: AC
  Filled 2014-01-21: qty 1

## 2014-01-21 MED ORDER — 0.9 % SODIUM CHLORIDE (POUR BTL) OPTIME
TOPICAL | Status: DC | PRN
  Administered 2014-01-21: 1000 mL

## 2014-01-21 MED ORDER — LIDOCAINE HCL (CARDIAC) 20 MG/ML IV SOLN
INTRAVENOUS | Status: AC
  Filled 2014-01-21: qty 5

## 2014-01-21 MED ORDER — SODIUM CHLORIDE 0.9 % IJ SOLN
INTRAMUSCULAR | Status: AC
Start: 2014-01-21 — End: 2014-01-21
  Filled 2014-01-21: qty 10

## 2014-01-21 MED ORDER — METHOCARBAMOL 500 MG PO TABS
500.0000 mg | ORAL_TABLET | Freq: Four times a day (QID) | ORAL | Status: DC | PRN
  Administered 2014-01-21 – 2014-01-22 (×3): 500 mg via ORAL
  Filled 2014-01-21 (×3): qty 1

## 2014-01-21 MED ORDER — SODIUM CHLORIDE 0.9 % IR SOLN
Status: DC | PRN
  Administered 2014-01-21: 09:00:00

## 2014-01-21 MED ORDER — PROPOFOL 10 MG/ML IV BOLUS
INTRAVENOUS | Status: AC
  Filled 2014-01-21: qty 20

## 2014-01-21 MED ORDER — METFORMIN HCL 500 MG PO TABS
1000.0000 mg | ORAL_TABLET | Freq: Two times a day (BID) | ORAL | Status: DC
  Administered 2014-01-21 – 2014-01-22 (×2): 1000 mg via ORAL
  Filled 2014-01-21 (×4): qty 2

## 2014-01-21 MED ORDER — PHENYLEPHRINE 40 MCG/ML (10ML) SYRINGE FOR IV PUSH (FOR BLOOD PRESSURE SUPPORT)
PREFILLED_SYRINGE | INTRAVENOUS | Status: AC
  Filled 2014-01-21: qty 10

## 2014-01-21 MED ORDER — GLIMEPIRIDE 1 MG PO TABS
1.0000 mg | ORAL_TABLET | Freq: Two times a day (BID) | ORAL | Status: DC
  Administered 2014-01-21 – 2014-01-22 (×2): 1 mg via ORAL
  Filled 2014-01-21 (×4): qty 1

## 2014-01-21 MED ORDER — LACTATED RINGERS IV SOLN
INTRAVENOUS | Status: DC | PRN
  Administered 2014-01-21 (×2): via INTRAVENOUS

## 2014-01-21 MED ORDER — ARTIFICIAL TEARS OP OINT
TOPICAL_OINTMENT | OPHTHALMIC | Status: AC
  Filled 2014-01-21: qty 3.5

## 2014-01-21 MED ORDER — METOPROLOL TARTRATE 25 MG PO TABS
25.0000 mg | ORAL_TABLET | Freq: Two times a day (BID) | ORAL | Status: DC
  Administered 2014-01-21 – 2014-01-22 (×2): 25 mg via ORAL
  Filled 2014-01-21 (×3): qty 1

## 2014-01-21 MED ORDER — MORPHINE SULFATE 2 MG/ML IJ SOLN
1.0000 mg | INTRAMUSCULAR | Status: DC | PRN
  Administered 2014-01-21: 2 mg via INTRAVENOUS
  Filled 2014-01-21: qty 1

## 2014-01-21 MED ORDER — MIDAZOLAM HCL 5 MG/5ML IJ SOLN
INTRAMUSCULAR | Status: DC | PRN
  Administered 2014-01-21: 2 mg via INTRAVENOUS

## 2014-01-21 MED ORDER — PROPOFOL 10 MG/ML IV BOLUS
INTRAVENOUS | Status: DC | PRN
  Administered 2014-01-21: 140 mg via INTRAVENOUS

## 2014-01-21 MED ORDER — SODIUM CHLORIDE 0.9 % IJ SOLN: 3.0000 mL | INTRAMUSCULAR | Status: DC | PRN

## 2014-01-21 MED ORDER — ASPIRIN EC 81 MG PO TBEC
81.0000 mg | DELAYED_RELEASE_TABLET | Freq: Every day | ORAL | Status: DC
  Administered 2014-01-22: 81 mg via ORAL
  Filled 2014-01-21: qty 1

## 2014-01-21 MED ORDER — ONDANSETRON HCL 4 MG/2ML IJ SOLN: 4.0000 mg | Freq: Once | INTRAMUSCULAR | Status: DC | PRN

## 2014-01-21 MED ORDER — SODIUM CHLORIDE 0.9 % IJ SOLN
3.0000 mL | Freq: Two times a day (BID) | INTRAMUSCULAR | Status: DC
  Administered 2014-01-21 (×2): 3 mL via INTRAVENOUS

## 2014-01-21 MED ORDER — BUPIVACAINE HCL (PF) 0.25 % IJ SOLN
INTRAMUSCULAR | Status: DC | PRN
  Administered 2014-01-21: 3 mL

## 2014-01-21 MED ORDER — FENTANYL CITRATE 0.05 MG/ML IJ SOLN: 25.0000 ug | INTRAMUSCULAR | Status: DC | PRN

## 2014-01-21 MED ORDER — CELECOXIB 200 MG PO CAPS
200.0000 mg | ORAL_CAPSULE | Freq: Two times a day (BID) | ORAL | Status: DC
  Administered 2014-01-21 – 2014-01-22 (×3): 200 mg via ORAL
  Filled 2014-01-21 (×4): qty 1

## 2014-01-21 MED ORDER — ACETAMINOPHEN 325 MG PO TABS: 650.0000 mg | ORAL_TABLET | ORAL | Status: DC | PRN

## 2014-01-21 MED ORDER — ACETAMINOPHEN 650 MG RE SUPP: 650.0000 mg | RECTAL | Status: DC | PRN

## 2014-01-21 MED ORDER — EPHEDRINE SULFATE 50 MG/ML IJ SOLN
INTRAMUSCULAR | Status: DC | PRN
  Administered 2014-01-21 (×5): 10 mg via INTRAVENOUS

## 2014-01-21 SURGICAL SUPPLY — 72 items
APL SKNCLS STERI-STRIP NONHPOA (GAUZE/BANDAGES/DRESSINGS) ×1
BAG DECANTER FOR FLEXI CONT (MISCELLANEOUS) ×3 IMPLANT
BENZOIN TINCTURE PRP APPL 2/3 (GAUZE/BANDAGES/DRESSINGS) ×3 IMPLANT
BLADE 10 SAFETY STRL DISP (BLADE) IMPLANT
BLADE SURG 15 STRL LF DISP TIS (BLADE) ×1 IMPLANT
BLADE SURG 15 STRL SS (BLADE) ×2
BLADE SURG ROTATE 9660 (MISCELLANEOUS) ×3 IMPLANT
BUR MATCHSTICK NEURO 3.0 LAGG (BURR) ×3 IMPLANT
CAGE COROENT LG 10X9X23-12 (Cage) ×6 IMPLANT
CANISTER SUCT 3000ML (MISCELLANEOUS) ×3 IMPLANT
CLIP NEUROVISION LG (CLIP) ×3 IMPLANT
CLOSURE WOUND 1/2 X4 (GAUZE/BANDAGES/DRESSINGS) ×1
CONT SPEC 4OZ CLIKSEAL STRL BL (MISCELLANEOUS) ×6 IMPLANT
COVER BACK TABLE 24X17X13 BIG (DRAPES) IMPLANT
COVER TABLE BACK 60X90 (DRAPES) ×3 IMPLANT
DRAPE C-ARM 42X72 X-RAY (DRAPES) ×3 IMPLANT
DRAPE C-ARMOR (DRAPES) ×3 IMPLANT
DRAPE LAPAROTOMY 100X72X124 (DRAPES) ×3 IMPLANT
DRAPE POUCH INSTRU U-SHP 10X18 (DRAPES) ×3 IMPLANT
DRAPE SURG 17X23 STRL (DRAPES) ×3 IMPLANT
DRSG OPSITE 4X5.5 SM (GAUZE/BANDAGES/DRESSINGS) ×3 IMPLANT
DRSG OPSITE POSTOP 3X4 (GAUZE/BANDAGES/DRESSINGS) ×3 IMPLANT
DRSG TELFA 3X8 NADH (GAUZE/BANDAGES/DRESSINGS) IMPLANT
DURAPREP 26ML APPLICATOR (WOUND CARE) ×3 IMPLANT
ELECT REM PT RETURN 9FT ADLT (ELECTROSURGICAL) ×3
ELECTRODE REM PT RTRN 9FT ADLT (ELECTROSURGICAL) ×1 IMPLANT
EVACUATOR 1/8 PVC DRAIN (DRAIN) ×3 IMPLANT
GAUZE SPONGE 4X4 16PLY XRAY LF (GAUZE/BANDAGES/DRESSINGS) IMPLANT
GLOVE BIO SURGEON STRL SZ8 (GLOVE) ×6 IMPLANT
GLOVE BIOGEL PI IND STRL 7.0 (GLOVE) ×1 IMPLANT
GLOVE BIOGEL PI IND STRL 7.5 (GLOVE) ×2 IMPLANT
GLOVE BIOGEL PI INDICATOR 7.0 (GLOVE) ×2
GLOVE BIOGEL PI INDICATOR 7.5 (GLOVE) ×4
GLOVE ECLIPSE 7.0 STRL STRAW (GLOVE) ×6 IMPLANT
GLOVE SURG SS PI 7.0 STRL IVOR (GLOVE) ×6 IMPLANT
GOWN STRL REUS W/ TWL LRG LVL3 (GOWN DISPOSABLE) ×3 IMPLANT
GOWN STRL REUS W/ TWL XL LVL3 (GOWN DISPOSABLE) ×1 IMPLANT
GOWN STRL REUS W/TWL 2XL LVL3 (GOWN DISPOSABLE) IMPLANT
GOWN STRL REUS W/TWL LRG LVL3 (GOWN DISPOSABLE) ×9
GOWN STRL REUS W/TWL XL LVL3 (GOWN DISPOSABLE) ×3
HEMOSTAT POWDER KIT SURGIFOAM (HEMOSTASIS) IMPLANT
KIT BASIN OR (CUSTOM PROCEDURE TRAY) ×3 IMPLANT
KIT NEEDLE NVM5 EMG ELECT (KITS) ×1 IMPLANT
KIT NEEDLE NVM5 EMG ELECTRODE (KITS) ×2
KIT ROOM TURNOVER OR (KITS) ×3 IMPLANT
MILL MEDIUM DISP (BLADE) ×3 IMPLANT
NEEDLE HYPO 25X1 1.5 SAFETY (NEEDLE) ×3 IMPLANT
NS IRRIG 1000ML POUR BTL (IV SOLUTION) ×3 IMPLANT
PACK LAMINECTOMY NEURO (CUSTOM PROCEDURE TRAY) ×3 IMPLANT
PAD ARMBOARD 7.5X6 YLW CONV (MISCELLANEOUS) ×15 IMPLANT
PENCIL BUTTON HOLSTER BLD 10FT (ELECTRODE) ×3 IMPLANT
PUTTY STIMUBLAST 2.5CC (Putty) ×3 IMPLANT
ROD 35MM (Rod) ×6 IMPLANT
SCREW LOCK (Screw) ×12 IMPLANT
SCREW LOCK FXNS SPNE MAS PL (Screw) ×4 IMPLANT
SCREW MAS PLIF 5.5X30 (Screw) ×4 IMPLANT
SCREW PAS PLIF 5X30 (Screw) ×2 IMPLANT
SCREW SHANK 5.0X35 (Screw) ×6 IMPLANT
SCREW TULIP 5.5 (Screw) ×6 IMPLANT
SPONGE LAP 4X18 X RAY DECT (DISPOSABLE) IMPLANT
SPONGE SURGIFOAM ABS GEL 100 (HEMOSTASIS) ×3 IMPLANT
STRIP CLOSURE SKIN 1/2X4 (GAUZE/BANDAGES/DRESSINGS) ×2 IMPLANT
SUT VIC AB 0 CT1 18XCR BRD8 (SUTURE) ×1 IMPLANT
SUT VIC AB 0 CT1 8-18 (SUTURE) ×3
SUT VIC AB 2-0 CP2 18 (SUTURE) ×3 IMPLANT
SUT VIC AB 3-0 SH 8-18 (SUTURE) ×9 IMPLANT
SYR 20ML ECCENTRIC (SYRINGE) ×3 IMPLANT
SYR 3ML LL SCALE MARK (SYRINGE) IMPLANT
TOWEL OR 17X24 6PK STRL BLUE (TOWEL DISPOSABLE) ×3 IMPLANT
TOWEL OR 17X26 10 PK STRL BLUE (TOWEL DISPOSABLE) ×3 IMPLANT
TRAY FOLEY CATH 14FRSI W/METER (CATHETERS) IMPLANT
WATER STERILE IRR 1000ML POUR (IV SOLUTION) ×3 IMPLANT

## 2014-01-21 NOTE — Op Note (Signed)
01/21/2014  10:51 AM  PATIENT:  Brandon Santos  80 y.o. male  PRE-OPERATIVE DIAGNOSIS:  L4-5 spondylolisthesis with spinal stenosis causing back and leg pain  POST-OPERATIVE DIAGNOSIS:  Same  PROCEDURE:   1. Decompressive lumbar laminectomy L4-5 (Gill-type) requiring more work than would be required for a simple exposure of the disk for PLIF in order to adequately decompress the neural elements and address the spinal stenosis 2. Posterior lumbar interbody fusion L4-5 using PEEK interbody cages packed with morcellized allograft and autograft 3. Posterior fixation L4-5 using cortical pedicle screws.    SURGEON:  Sherley Bounds, MD  ASSISTANTS: Dr.Nundkumar  ANESTHESIA:  General  EBL: 100 ml  Total I/O In: 1400 [I.V.:1400] Out: 135 [Urine:135]  BLOOD ADMINISTERED:none  DRAINS: Hemovac   INDICATION FOR PROCEDURE: This patient presented with neurogenic claudication. He had an MRI which showed a spondylolisthesis at L4-5 with severe spinal stenosis. He tried medical management without relief. I recommended decompression and instrumented fusion to address his spinal stenosis and segmental instability. Patient understood the risks, benefits, and alternatives and potential outcomes and wished to proceed.  PROCEDURE DETAILS:  The patient was brought to the operating room. After induction of generalized endotracheal anesthesia the patient was rolled into the prone position on chest rolls and all pressure points were padded. The patient's lumbar region was cleaned and then prepped with DuraPrep and draped in the usual sterile fashion. Anesthesia was injected and then a dorsal midline incision was made and carried down to the lumbosacral fascia. The fascia was opened and the paraspinous musculature was taken down in a subperiosteal fashion to expose L4-5. A self-retaining retractor was placed. Intraoperative fluoroscopy confirmed my level, and I started with placement of the L4 cortical pedicle  screws. The pedicle screw entry zones were identified utilizing surface landmarks and  AP and lateral fluoroscopy. I scored the cortex with the high-speed drill and then used the hand drill and EMG monitoring to drill an upward and outward direction into the pedicle. I then tapped line to line, and the tap was also monitored. I then placed a 5-0 x 35 mm cortical pedicle screw into the pedicles of L4 bilaterally. I then turned my attention to the decompression and the spinous process was removed and complete lumbar laminectomies, hemi- facetectomies, and foraminotomies were performed at L4-5. The patient had significant spinal stenosis and this required more work than would be required for a simple exposure of the disc for posterior lumbar interbody fusion. Much more generous decompression was undertaken in order to adequately decompress the neural elements and address the patient's leg pain. The yellow ligament was removed to expose the underlying dura and nerve roots, and generous foraminotomies were performed to adequately decompress the neural elements. Both the exiting and traversing nerve roots were decompressed on both sides until a coronary dilator passed easily along the nerve roots. Once the decompression was complete, I turned my attention to the posterior lower lumbar interbody fusion. The epidural venous vasculature was coagulated and cut sharply. Disc space was incised and the initial discectomy was performed with pituitary rongeurs. The disc space was distracted with sequential distractors to a height of 10 mm. We then used a series of scrapers and shavers to prepare the endplates for fusion. The midline was prepared with Epstein curettes. Once the complete discectomy was finished, we packed an appropriate sized peek interbody cage (10 mm x 23 mm x 12 ) with local autograft and morcellized allograft, gently retracted the nerve root, and tapped the  cage into position at L4-5.  The midline between the  cages was packed with morselized autograft and allograft. We then turned our attention to the placement of the lower pedicle screws. The pedicle screw entry zones were identified utilizing surface landmarks and fluoroscopy. I drilled into each pedicle utilizing the hand drill and EMG monitoring, and tapped each pedicle with the appropriate tap. We palpated with a ball probe to assure no break in the cortex. We then placed 5-0 x 30 mm cortical pedicle screws into the pedicles bilaterally at L5. . We then placed lordotic rods into the multiaxial screw heads of the pedicle screws and locked these in position with the locking caps and anti-torque device. We then checked our construct with AP and lateral fluoroscopy. Irrigated with copious amounts of bacitracin-containing saline solution. Placed a medium Hemovac drain through separate stab incision. Inspected the nerve roots once again to assure adequate decompression, lined to the dura with Gelfoam, and closed the muscle and the fascia with 0 Vicryl. Closed the subcutaneous tissues with 2-0 Vicryl and subcuticular tissues with 3-0 Vicryl. The skin was closed with benzoin and Steri-Strips. Dressing was then applied, the patient was awakened from general anesthesia and transported to the recovery room in stable condition. At the end of the procedure all sponge, needle and instrument counts were correct.   PLAN OF CARE: Admit to inpatient   PATIENT DISPOSITION:  PACU - hemodynamically stable.   Delay start of Pharmacological VTE agent (>24hrs) due to surgical blood loss or risk of bleeding:  yes

## 2014-01-21 NOTE — Progress Notes (Signed)
Orthopedic Tech Progress Note Patient Details:  Brandon Santos Nov 01, 1933 [id number] Brace completed by bio-tech vendor. Patient ID: Brandon Santos, male   DOB: September 24, 1933, 80 y.o.   MRN: [id number]   [name] 01/21/2014, 9:02 PM

## 2014-01-21 NOTE — H&P (Signed)
Subjective: Patient is a 80 y.o. male admitted for PLIF L4-5 Onset of symptoms was many moths ago, progressively worse since that time.  The pain is rated severe, and is located at the low back and radiates to legs. The pain is described as aching and occurs with walking. The symptoms have been progressive. Symptoms are exacerbated by standing and walking. MRI or CT showed severe stenosis with spondylolisthesis L4-5. Pain interferes with his ADLs and QOL.  Past Medical History  Diagnosis Date  . Hypertension   . Diabetes mellitus without complication     Past Surgical History  Procedure Laterality Date  . Appendectomy  1966  . Colonoscopy  2013    had it close to end of year, exam was normal  . Drain fluid from lungs  1938    states "I was 80 year old"    Prior to Admission medications   Medication Sig Start Date End Date Taking? Authorizing Provider  ascorbic acid (VITAMIN C) 500 MG tablet Take 500 mg by mouth daily. With Rose Tips   Yes Historical Provider, MD  aspirin EC 81 MG tablet Take 81 mg by mouth daily.   Yes Historical Provider, MD  B Complex Vitamins (B COMPLEX 50) TABS Take 1 tablet by mouth daily.    Yes Historical Provider, MD  Calcium-Vitamin D (CALTRATE 600 PLUS-VIT D PO) Take 1 tablet by mouth daily.    Yes Historical Provider, MD  Cholecalciferol (VITAMIN D-3) 1000 UNITS CAPS Take 1,000 Units by mouth daily.   Yes Historical Provider, MD  furosemide (LASIX) 40 MG tablet Take 1 tablet (40 mg total) by mouth daily. 08/17/13  Yes Neena Rhymes, MD  glimepiride (AMARYL) 2 MG tablet Take 1 mg by mouth 2 (two) times daily.   Yes Historical Provider, MD  lisinopril (PRINIVIL,ZESTRIL) 40 MG tablet Take 40 mg by mouth every morning.   Yes Historical Provider, MD  metFORMIN (GLUCOPHAGE) 1000 MG tablet Take 1,000 mg by mouth 2 (two) times daily with a meal.   Yes Historical Provider, MD  metoprolol tartrate (LOPRESSOR) 25 MG tablet Take 1 tablet (25 mg total) by mouth 2 (two)  times daily. 08/17/13  Yes Neena Rhymes, MD  Multiple Vitamins-Minerals (CENTRUM PO) Take 1 tablet by mouth daily.    Yes Historical Provider, MD  simvastatin (ZOCOR) 20 MG tablet Take 1 tablet (20 mg total) by mouth every evening. 11/02/13  Yes Lucretia Kern, DO  vitamin E 400 UNIT capsule Take 400 Units by mouth daily.   Yes Historical Provider, MD   No Known Allergies  History  Substance Use Topics  . Smoking status: Never Smoker   . Smokeless tobacco: Never Used  . Alcohol Use: No     Comment: only drinks at Party/Holidays    Family History  Problem Relation Age of Onset  . Colon cancer Neg Hx   . Heart disease Father      Review of Systems  Positive ROS: neg  All other systems have been reviewed and were otherwise negative with the exception of those mentioned in the HPI and as above.  Objective: Vital signs in last 24 hours: Temp:  [97.1 F (36.2 C)] 97.1 F (36.2 C) (07/30 0554) Pulse Rate:  [64] 64 (07/30 0554) Resp:  [16] 16 (07/30 0554) BP: (198-205)/(62-87) 198/62 mmHg (07/30 0558) SpO2:  [97 %] 97 % (07/30 0554) Weight:  [81.784 kg (180 lb 4.8 oz)] 81.784 kg (180 lb 4.8 oz) (07/30 0554)  General Appearance:  Alert, cooperative, no distress, appears stated age Head: Normocephalic, without obvious abnormality, atraumatic Eyes: PERRL, conjunctiva/corneas clear, EOM's intact    Neck: Supple, symmetrical, trachea midline Back: Symmetric, no curvature, ROM normal, no CVA tenderness Lungs:  respirations unlabored Heart: Regular rate and rhythm Abdomen: Soft, non-tender Extremities: Extremities normal, atraumatic, no cyanosis or edema Pulses: 2+ and symmetric all extremities Skin: Skin color, texture, turgor normal, no rashes or lesions  NEUROLOGIC:   Mental status: Alert and oriented x4,  no aphasia, good attention span, fund of knowledge, and memory Motor Exam - grossly normal Sensory Exam - grossly normal Reflexes: 1+ Coordination - grossly normal Gait -  grossly normal Balance - grossly normal Cranial Nerves: I: smell Not tested  II: visual acuity  OS: nl    OD: nl  II: visual fields Full to confrontation  II: pupils Equal, round, reactive to light  III,VII: ptosis None  III,IV,VI: extraocular muscles  Full ROM  V: mastication Normal  V: facial light touch sensation  Normal  V,VII: corneal reflex  Present  VII: facial muscle function - upper  Normal  VII: facial muscle function - lower Normal  VIII: hearing Not tested  IX: soft palate elevation  Normal  IX,X: gag reflex Present  XI: trapezius strength  5/5  XI: sternocleidomastoid strength 5/5  XI: neck flexion strength  5/5  XII: tongue strength  Normal    Data Review Lab Results  Component Value Date   WBC 9.9 01/14/2014   HGB 13.5 01/14/2014   HCT 40.4 01/14/2014   MCV 90.0 01/14/2014   PLT 220 01/14/2014   Lab Results  Component Value Date   NA 142 01/14/2014   K 5.0 01/14/2014   CL 105 01/14/2014   CO2 26 01/14/2014   BUN 26* 01/14/2014   CREATININE 1.23 01/14/2014   GLUCOSE 79 01/14/2014   No results found for this basename: INR, PROTIME    Assessment/Plan: Patient admitted for PLIF L4-5. Patient has failed a reasonable attempt at conservative therapy.  I explained the condition and procedure to the patient and answered any questions.  Patient wishes to proceed with procedure as planned. Understands risks/ benefits and typical outcomes of procedure.   Brandon Santos S 01/21/2014 6:27 AM

## 2014-01-21 NOTE — Transfer of Care (Signed)
Immediate Anesthesia Transfer of Care Note  Patient: Brandon Santos  Procedure(s) Performed: Procedure(s): FOR MAXIMUM ACCESS (MAS) POSTERIOR LUMBAR INTERBODY FUSION (PLIF) LUMBAR FOUR FIVE (N/A)  Patient Location: PACU  Anesthesia Type:General  Level of Consciousness: awake, alert , oriented and patient cooperative  Airway & Oxygen Therapy: Patient Spontanous Breathing and Patient connected to face mask oxygen  Post-op Assessment: Report given to PACU RN, Post -op Vital signs reviewed and stable and Patient moving all extremities  Post vital signs: Reviewed and stable  Complications: No apparent anesthesia complications

## 2014-01-21 NOTE — Anesthesia Preprocedure Evaluation (Signed)
Anesthesia Evaluation  Patient identified by MRN, date of birth, ID band Patient awake    Reviewed: Allergy & Precautions, H&P , NPO status   Airway Mallampati: II TM Distance: >3 FB Neck ROM: Full    Dental  (+) Teeth Intact, Dental Advisory Given   Pulmonary  breath sounds clear to auscultation        Cardiovascular hypertension, Rhythm:Regular Rate:Normal     Neuro/Psych    GI/Hepatic   Endo/Other  diabetes  Renal/GU      Musculoskeletal   Abdominal   Peds  Hematology   Anesthesia Other Findings   Reproductive/Obstetrics                           Anesthesia Physical Anesthesia Plan  ASA: III  Anesthesia Plan: General   Post-op Pain Management:    Induction: Intravenous  Airway Management Planned: Oral ETT  Additional Equipment:   Intra-op Plan:   Post-operative Plan: Extubation in OR  Informed Consent: I have reviewed the patients History and Physical, chart, labs and discussed the procedure including the risks, benefits and alternatives for the proposed anesthesia with the patient or authorized representative who has indicated his/her understanding and acceptance.   Dental advisory given  Plan Discussed with: CRNA and Anesthesiologist  Anesthesia Plan Comments: (Lumbar spinal stenosis Type 2 DM glucose 88 Hypertension Renal insufficiency   Plan GA with oral ETT  Roberts Gaudy, MD)        Anesthesia Quick Evaluation

## 2014-01-21 NOTE — Anesthesia Procedure Notes (Signed)
Procedure Name: Intubation Date/Time: 01/21/2014 7:39 AM Performed by: Julian Reil Pre-anesthesia Checklist: Patient identified, Emergency Drugs available, Suction available and Patient being monitored Patient Re-evaluated:Patient Re-evaluated prior to inductionOxygen Delivery Method: Circle system utilized Preoxygenation: Pre-oxygenation with 100% oxygen Intubation Type: IV induction Ventilation: Mask ventilation without difficulty Laryngoscope Size: Mac and 4 Grade View: Grade II Tube type: Oral Tube size: 7.5 mm Number of attempts: 1 Airway Equipment and Method: Stylet Placement Confirmation: ETT inserted through vocal cords under direct vision,  positive ETCO2 and breath sounds checked- equal and bilateral Secured at: 22 cm Tube secured with: Tape Dental Injury: Teeth and Oropharynx as per pre-operative assessment

## 2014-01-21 NOTE — Plan of Care (Signed)
Problem: Consults Goal: Diagnosis - Spinal Surgery Outcome: Completed/Met Date Met:  01/21/14 [facility]aco/Lumbar Spine Fusion

## 2014-01-21 NOTE — Anesthesia Postprocedure Evaluation (Signed)
  Anesthesia Post-op Note  Patient: Brandon Santos  Procedure(s) Performed: Procedure(s): FOR MAXIMUM ACCESS (MAS) POSTERIOR LUMBAR INTERBODY FUSION (PLIF) LUMBAR FOUR FIVE (N/A)  Patient Location: PACU  Anesthesia Type:General  Level of Consciousness: awake, alert  and oriented  Airway and Oxygen Therapy: Patient Spontanous Breathing and Patient connected to nasal cannula oxygen  Post-op Pain: mild  Post-op Assessment: Post-op Vital signs reviewed, Patient's Cardiovascular Status Stable, Respiratory Function Stable, Patent Airway and Pain level controlled  Post-op Vital Signs: stable  Last Vitals:  Filed Vitals:   01/21/14 1630  BP: 143/66  Pulse: 81  Temp: 36.6 C  Resp: 18    Complications: No apparent anesthesia complications

## 2014-01-21 NOTE — Progress Notes (Signed)
Orthopedic Tech Progress Note Patient Details:  Brandon Santos May 20, 1934 [id number] Called [employer] for brace order. Patient ID: Brandon Santos, male   DOB: [birth year], 80 y.o.   MRN: [id number]   [name] 01/21/2014, 3:35 PM

## 2014-01-22 ENCOUNTER — Encounter (HOSPITAL_COMMUNITY): Payer: Self-pay | Admitting: Neurological Surgery

## 2014-01-22 LAB — GLUCOSE, CAPILLARY
GLUCOSE-CAPILLARY: 105 mg/dL — AB (ref 70–99)
GLUCOSE-CAPILLARY: 133 mg/dL — AB (ref 70–99)
Glucose-Capillary: 100 mg/dL — ABNORMAL HIGH (ref 70–99)

## 2014-01-22 MED ORDER — OXYCODONE-ACETAMINOPHEN 5-325 MG PO TABS: 1.0000 | ORAL_TABLET | Freq: Four times a day (QID) | ORAL | Status: DC | PRN

## 2014-01-22 NOTE — Discharge Summary (Signed)
Physician Discharge Summary  Patient ID: Brandon Santos MRN: [id number] DOB/AGE: 78-Oct-1935 78 y.o.  Admit date: 01/21/2014 Discharge date: 01/22/2014  Admission Diagnoses: lumbar stenosis with instability   Discharge Diagnoses: same   [place] Hospital Course: The patient was admitted on 01/21/2014 and taken to the operating room where the patient underwent PLIF L4-5. The patient tolerated the procedure well and was taken to the recovery room and then to the floor in stable condition. The hospital course was routine. There were no complications. The wound remained clean dry and intact. Pt had appropriate back soreness. No complaints of leg pain or new N/T/W. The patient remained afebrile with stable vital signs, and tolerated a regular diet. The patient continued to increase activities, and pain was well controlled with oral pain medications.   Consults: None  Significant Diagnostic Studies:  Results for orders placed during the hospital encounter of 01/21/14  GLUCOSE, CAPILLARY      Result Value Ref Range   Glucose-Capillary 88  70 - 99 mg/dL  GLUCOSE, CAPILLARY      Result Value Ref Range   Glucose-Capillary 101 (*) 70 - 99 mg/dL  GLUCOSE, CAPILLARY      Result Value Ref Range   Glucose-Capillary 101 (*) 70 - 99 mg/dL  GLUCOSE, CAPILLARY      Result Value Ref Range   Glucose-Capillary 104 (*) 70 - 99 mg/dL  GLUCOSE, CAPILLARY      Result Value Ref Range   Glucose-Capillary 188 (*) 70 - 99 mg/dL  GLUCOSE, CAPILLARY      Result Value Ref Range   Glucose-Capillary 105 (*) 70 - 99 mg/dL   Comment 1 Documented in Chart      Dg Chest 2 View  01/14/2014   CLINICAL DATA:  Preop evaluation.  Hypertension  EXAM: CHEST  2 VIEW  COMPARISON:  None.  FINDINGS: Heart size is within normal limits. Negative for heart failure. Small areas scar or atelectasis in the lingula. Remaining lungs are clear. Negative for mass or pneumonia.  IMPRESSION: Mild atelectasis or scarring  in the lingula. No acute cardiopulmonary abnormality.   Electronically Signed   By: Franchot Gallo M.D.   On: 01/14/2014 09:52   Dg Lumbar Spine 2-3 Views  01/21/2014   CLINICAL DATA:  Posterior fusion of L4-5.  EXAM: LUMBAR SPINE - 2-3 VIEW; DG C-ARM 61-120 MIN  COMPARISON:  None.  FINDINGS: Two intraoperative fluoroscopic images of the lower lumbar spine were submitted for review. These images demonstrate placement of bilateral intrapedicular screws at L4 and L5 for fusion purposes. Interbody fusion of L4-5 disc space is noted as well.  IMPRESSION: Status post posterior fusion of L4-5.   Electronically Signed   By: Sabino Dick M.D.   On: 01/21/2014 10:40   Dg C-arm 1-60 Min  01/21/2014   CLINICAL DATA:  Posterior fusion of L4-5.  EXAM: LUMBAR SPINE - 2-3 VIEW; DG C-ARM 61-120 MIN  COMPARISON:  None.  FINDINGS: Two intraoperative fluoroscopic images of the lower lumbar spine were submitted for review. These images demonstrate placement of bilateral intrapedicular screws at L4 and L5 for fusion purposes. Interbody fusion of L4-5 disc space is noted as well.  IMPRESSION: Status post posterior fusion of L4-5.   Electronically Signed   By: Sabino Dick M.D.   On: 01/21/2014 10:40    Antibiotics:  Anti-infectives   Start     Dose/Rate Route Frequency Ordered Stop   01/21/14 1600  ceFAZolin (ANCEF) IVPB 1 g/50 mL premix  1 g 100 mL/hr over 30 Minutes Intravenous Every 8 hours 01/21/14 1253 01/22/14 0417   01/21/14 0840  bacitracin 50,000 Units in sodium chloride irrigation 0.9 % 500 mL irrigation  Status:  Discontinued       As needed 01/21/14 0840 01/21/14 1056   01/21/14 0600  ceFAZolin (ANCEF) IVPB 2 g/50 mL premix     2 g 100 mL/hr over 30 Minutes Intravenous On call to O.R. 01/20/14 1358 01/21/14 0800      Discharge Exam: Blood pressure 153/72, pulse 71, temperature 97.3 F (36.3 C), temperature source Oral, resp. rate 18, weight 81.784 kg (180 lb 4.8 oz), SpO2 96.00%. Neurologic:  Grossly normal Incision CDI  Discharge Medications:     Medication List         ascorbic acid 500 MG tablet  Commonly known as:  VITAMIN C  Take 500 mg by mouth daily. With Rose Tips     aspirin EC 81 MG tablet  Take 81 mg by mouth daily.     B COMPLEX 50 Tabs  Take 1 tablet by mouth daily.     CALTRATE 600 PLUS-VIT D PO  Take 1 tablet by mouth daily.     CENTRUM PO  Take 1 tablet by mouth daily.     furosemide 40 MG tablet  Commonly known as:  LASIX  Take 1 tablet (40 mg total) by mouth daily.     glimepiride 2 MG tablet  Commonly known as:  AMARYL  Take 1 mg by mouth 2 (two) times daily.     lisinopril 40 MG tablet  Commonly known as:  PRINIVIL,ZESTRIL  Take 40 mg by mouth every morning.     metFORMIN 1000 MG tablet  Commonly known as:  GLUCOPHAGE  Take 1,000 mg by mouth 2 (two) times daily with a meal.     metoprolol tartrate 25 MG tablet  Commonly known as:  LOPRESSOR  Take 1 tablet (25 mg total) by mouth 2 (two) times daily.     oxyCODONE-acetaminophen 5-325 MG per tablet  Commonly known as:  PERCOCET/ROXICET  Take 1-2 tablets by mouth every 6 (six) hours as needed for moderate pain.     simvastatin 20 MG tablet  Commonly known as:  ZOCOR  Take 1 tablet (20 mg total) by mouth every evening.     Vitamin D-3 1000 UNITS Caps  Take 1,000 Units by mouth daily.     vitamin E 400 UNIT capsule  Take 400 Units by mouth daily.        Disposition: home   Final Dx: PLIF L4-5      Discharge Instructions   Call MD for:  difficulty breathing, headache or visual disturbances    Complete by:  As directed      Call MD for:  persistant nausea and vomiting    Complete by:  As directed      Call MD for:  redness, tenderness, or signs of infection (pain, swelling, redness, odor or green/yellow discharge around incision site)    Complete by:  As directed      Call MD for:  severe uncontrolled pain    Complete by:  As directed      Call MD for:  temperature  >100.4    Complete by:  As directed      Diet - low sodium heart healthy    Complete by:  As directed      Discharge instructions    Complete by:  As directed  No bending, no twisting, no heavy lifting, no driving, may shower     Increase activity slowly    Complete by:  As directed      Remove dressing in 48 hours    Complete by:  As directed            Follow-up Information   Follow up with JONES,DAVID S, MD. Schedule an appointment as soon as possible for a visit in 3 weeks.   Specialty:  Neurosurgery   Contact information:   [address] [place] [address] [phone]        Signed: Eustace Moore 01/22/2014, 1:03 PM

## 2014-01-22 NOTE — Progress Notes (Addendum)
Occupational Therapy Evaluation Patient Details Name: Brandon Santos MRN: [id number] DOB: [birth year] Today's Date: 01/22/2014    History of Present Illness Pt admitted for L4-5 MAS PLIF   Clinical Impression   PTA pt independent with ADL and mobility. Completed all education regarding education using AE/DME and compensatory techniques for ADL and mobility. Pt given written information. Pt has requested that OT return and educate wife when she arrives. Pt very appreciative.     Follow Up Recommendations    no OT follow up   Equipment Recommendations    shower chair   Recommendations for Other Services   none    Precautions / Restrictions Precautions Precautions: Back Precaution Booklet Issued: Yes (comment) Required Braces or Orthoses: Spinal Brace Spinal Brace: Thoracolumbosacral orthotic;Applied in sitting position      Mobility Bed Mobility Overal bed mobility: Needs Assistance Bed Mobility: Rolling;Sidelying to Sit Rolling: Supervision Sidelying to sit: S       General bed mobility comments: good carry over from earlier session  Transfers Overall transfer level: Modified independent               General transfer comment: Able to demonstrate carry over from earlier PT session    Balance Overall balance assessment: No apparent balance deficits (not formally assessed)                                          ADL Overall ADL's : Needs assistance/impaired     Grooming: Supervision/safety   Upper Body Bathing: Supervision/ safety   Lower Body Bathing: Minimal assistance;Sit to/from stand   Upper Body Dressing : Supervision/safety   Lower Body Dressing: Minimal assistance;Sit to/from stand   Toilet Transfer: Supervision/safety   Toileting- Water quality scientist and Hygiene: Minimal assistance;Sit to/from stand       Functional mobility during ADLs: Supervision/safety General ADL Comments: Educated pt on use of compensatory  techniques and AE for ADL. PTA, pt had difficulty with LB ADL due to limitations of ROM with LLE. Pt able to return demonstrate use of equipment Reviewed hygiene after toileting/ use of compensatory techniques and AE     Vision                     Perception     Praxis      Pertinent Vitals/Pain No c/o pain     Hand Dominance     Extremity/Trunk Assessment Upper Extremity Assessment Upper Extremity Assessment: Overall WFL for tasks assessed   Lower Extremity Assessment Lower Extremity Assessment: Overall WFL for tasks assessed (Pt has limitations with ROM L LE affecting ADL)   Cervical / Trunk Assessment Cervical / Trunk Assessment: Normal   Communication Communication Communication: No difficulties   Cognition Arousal/Alertness: Awake/alert Behavior During Therapy: WFL for tasks assessed/performed Overall Cognitive Status: Within Functional Limits for tasks assessed                     General Comments       Exercises       Shoulder Instructions      Home Living Family/patient expects to be discharged to:: Private residence Living Arrangements: Spouse/significant other Available Help at Discharge: Family;Available 24 hours/day Type of Home: House       Home Layout: Two level;Bed/bath upstairs Alternate Level Stairs-Number of Steps: 12 Alternate Level Stairs-Rails: Right Bathroom Shower/Tub: Occupational psychologist:  Standard Bathroom Accessibility: Yes How Accessible: Accessible via walker Home Equipment: None          Prior Functioning/Environment Level of Independence: Independent             OT Diagnosis:  general weakness. Acute pain   OT Problem List:  decreased independence with ADL and mobility   OT Treatment/Interventions:   AE/DME education; precautions; pt/family education   OT Goals(Current goals can be found in the care plan section) Acute Rehab OT Goals Patient Stated Goal: return home  OT Goal  Formulation:  (eval only)  OT Frequency:   2x/wk  Barriers to D/C:   none         Co-evaluation              End of Session Equipment Utilized During Treatment: Back brace Nurse Communication: Mobility status  Activity Tolerance: Patient tolerated treatment well Patient left: in chair;with call bell/phone within reach   Time: 0830-0859 OT Time Calculation (min): 29 min Charges:  OT General Charges $OT Visit: 1 Procedure OT Evaluation $Initial OT Evaluation Tier I: 1 Procedure OT Treatments $Self Care/Home Management : 23-37 mins G-Codes:    Jori Frerichs,HILLARY 02/20/14, 9:03 AM   Maurie Boettcher, OTR/L  [phone] February 20, 2014

## 2014-01-22 NOTE — Progress Notes (Signed)
Occupational Therapy Treatment Patient Details Name: Brandon Santos MRN: [id number] DOB: Oct 19, 1933 Today's Date: 01/22/2014    History of present illness Pt admitted for L4-5 MAS PLIF   OT comments  Pt seen for additionall visit to complete family education per pt request. Pt able to return demonstrate precautions during ADL task. Pt ready for D/C when medically stable. OT signing off.   Follow Up Recommendations  No OT follow up;Supervision - Intermittent    Equipment Recommendations  Tub/shower seat    Recommendations for Other Services      Precautions / Restrictions Precautions Precautions: Back Precaution Booklet Issued: Yes (comment) Required Braces or Orthoses: Spinal Brace Spinal Brace: Thoracolumbosacral orthotic;Applied in sitting position Restrictions Weight Bearing Restrictions: No       Mobility Bed Mobility Transfers Overall transfer level: Modified independent               General transfer comment: Able to demonstrate carry over from earlier PT session    Balance Overall balance assessment: No apparent balance deficits (not formally assessed)                                 ADL       Functional mobility during ADLs: Supervision/safety General ADL Comments: Reviewed back precautions with pt/pt's family. Questions answered. Pt able to return demonstrate ADL and mobility for ADL during toileting. Pt requesting showerchair.       Vision                     Perception     Praxis      Cognition   Behavior During Therapy: WFL for tasks assessed/performed Overall Cognitive Status: Within Functional Limits for tasks assessed                       Extremity/Trunk Assessment  Upper Extremity Assessment Upper Extremity Assessment: Overall WFL for tasks assessed   Lower Extremity Assessment Lower Extremity Assessment: Overall WFL for tasks assessed (Pt has limitations with ROM L LE affecting ADL)   Cervical /  Trunk Assessment Cervical / Trunk Assessment: Normal    Exercises     Shoulder Instructions       General Comments      Pertinent Vitals/ Pain       No c/o pain  Home Living Family/patient expects to be discharged to:: Private residence Living Arrangements: Spouse/significant other Available Help at Discharge: Family;Available 24 hours/day Type of Home: House       Home Layout: Two level;Bed/bath upstairs Alternate Level Stairs-Number of Steps: 12 Alternate Level Stairs-Rails: Right Bathroom Shower/Tub: Occupational psychologist: Standard Bathroom Accessibility: Yes How Accessible: Accessible via walker Home Equipment: None          Prior Functioning/Environment Level of Independence: Independent            Frequency       Progress Toward Goals  OT Goals(current goals can now be found in the care plan section)  Progress towards OT goals: Goals met/education completed, patient discharged from OT  Acute Rehab OT Goals Patient Stated Goal: return home  OT Goal Formulation: With patient ADL Goals Pt Will Perform Lower Body Bathing: with supervision;with caregiver independent in assisting;with adaptive equipment;sit to/from stand Pt Will Perform Lower Body Dressing: with supervision;with adaptive equipment;with caregiver independent in assisting;sit to/from stand Pt Will Transfer to Toilet: with supervision;regular height toilet;grab bars  Pt Will Perform Toileting - Clothing Manipulation and hygiene: with supervision;sit to/from stand  Plan Discharge plan remains appropriate    Co-evaluation                 End of Session Equipment Utilized During Treatment: Back brace   Activity Tolerance Patient tolerated treatment well   Patient Left with family/visitor present;Other (comment);with call bell/phone within reach (walking with family)   Nurse Communication Mobility status;Other (comment) (need for showerchair)        Time:  1601-0932 OT Time Calculation (min): 7 min  Charges: OT General Charges $OT Visit: 1 Procedure  OT Treatments $Self Care/Home Management : 8-22 mins  Brandon Santos,Brandon Santos 01/22/2014, 9:42 AM   Maurie Boettcher, OTR/L  [phone] 01/22/2014

## 2014-01-22 NOTE — Evaluation (Signed)
Physical Therapy Evaluation Patient Details Name: Brandon Santos MRN: [id number] DOB: Feb 08, 1934 Today's Date: 01/22/2014   History of Present Illness  Pt admitted for L4-5 PLIF  Clinical Impression  Pt moving well and able to ambulate full hall and stairs without assist. Pt educated for brace don/doff, back precautions with bed mobility, transfers, gait and activity modification with verbalized understanding and handout provided. Will follow acutely to maximize transfers and function but no further therapy outside acute needed.     Follow Up Recommendations No PT follow up    Equipment Recommendations  None recommended by PT    Recommendations for Other Services       Precautions / Restrictions Precautions Precautions: Back Precaution Booklet Issued: Yes (comment) Required Braces or Orthoses: Spinal Brace Spinal Brace: Thoracolumbosacral orthotic;Applied in sitting position      Mobility  Bed Mobility Overal bed mobility: Needs Assistance Bed Mobility: Rolling;Sidelying to Sit Rolling: Supervision Sidelying to sit: Min assist       General bed mobility comments: cues for sequence assist for trunk elevation from surface  Transfers Overall transfer level: Modified independent                  Ambulation/Gait Ambulation/Gait assistance: Modified independent (Device/Increase time) Ambulation Distance (Feet): 450 Feet Assistive device: None Gait Pattern/deviations: WFL(Within Functional Limits)   Gait velocity interpretation: at or above normal speed for age/gender    Stairs Stairs: Yes   Stair Management: One rail Right;Alternating pattern;Forwards Number of Stairs: 10    Wheelchair Mobility    Modified Rankin (Stroke Patients Only)       Balance Overall balance assessment: No apparent balance deficits (not formally assessed)                                           Pertinent Vitals/Pain No pain    Home Living  Family/patient expects to be discharged to:: Private residence Living Arrangements: Spouse/significant other Available Help at Discharge: Family;Available 24 hours/day Type of Home: House       Home Layout: Two level;Bed/bath upstairs Home Equipment: None      Prior Function Level of Independence: Independent               Hand Dominance        Extremity/Trunk Assessment   Upper Extremity Assessment: Overall WFL for tasks assessed           Lower Extremity Assessment: Overall WFL for tasks assessed      Cervical / Trunk Assessment: Normal  Communication   Communication: No difficulties  Cognition Arousal/Alertness: Awake/alert Behavior During Therapy: WFL for tasks assessed/performed Overall Cognitive Status: Within Functional Limits for tasks assessed                      General Comments      Exercises        Assessment/Plan    PT Assessment Patient needs continued PT services  PT Diagnosis Difficulty walking   PT Problem List Decreased mobility;Decreased knowledge of precautions  PT Treatment Interventions Gait training;Functional mobility training;Therapeutic activities;Patient/family education   PT Goals (Current goals can be found in the Care Plan section) Acute Rehab PT Goals Patient Stated Goal: return home  PT Goal Formulation: With patient Time For Goal Achievement: 01/29/14 Potential to Achieve Goals: Good    Frequency Min 5X/week   Barriers to discharge  Co-evaluation               End of Session Equipment Utilized During Treatment: Back brace Activity Tolerance: Patient tolerated treatment well Patient left: in chair;with call bell/phone within Santos Nurse Communication: Mobility status         Time: 7342-8768 PT Time Calculation (min): 24 min   Charges:   PT Evaluation $Initial PT Evaluation Tier I: 1 Procedure PT Treatments $Therapeutic Activity: 8-22 mins   PT G Codes:          Brandon Santos 01/22/2014, 8:28 AM Brandon Santos, [place]

## 2014-01-22 NOTE — Discharge Instructions (Signed)
Wound Care Keep incision covered and dry for 2 days.  Do not put any creams, lotions, or ointments on incision. Leave steri-strips on back.  They will fall off by themselves. Activity Walk each and every day, increasing distance each day. No lifting greater than 5 lbs.  Avoid bending, arching, or twisting. No driving for 2 weeks; may ride as a passenger locally. If provided with back brace, wear when out of bed.  It is not necessary to wear in bed.  Diet Resume your normal diet.   Call Your Doctor If Any of These Occur Redness, drainage, or swelling at the wound.  Temperature greater than 101 degrees. Severe pain not relieved by pain medication. Incision starts to come apart. Follow Up Appt Call today for appointment in 1-2 weeks [phone]) or for problems.  If you have any hardware placed in your spine, you will need an x-ray before your appointment.

## 2014-01-23 NOTE — Progress Notes (Signed)
Patient alert and oriented, mae's well, voiding adequate amount of urine, swallowing without difficulty, c/o moderate pain and meds given prior to discharged. Patient discharged home with family. Script and discharged instructions given to patient. Patient and family stated understanding of instructions given.  

## 2014-02-05 ENCOUNTER — Other Ambulatory Visit: Payer: Self-pay | Admitting: *Deleted

## 2014-02-05 MED ORDER — ONETOUCH DELICA LANCETS 33G MISC: Status: AC

## 2014-02-05 MED ORDER — GLUCOSE BLOOD VI STRP: ORAL_STRIP | Status: AC

## 2014-02-22 ENCOUNTER — Other Ambulatory Visit: Payer: Self-pay | Admitting: Neurological Surgery

## 2014-02-23 ENCOUNTER — Encounter (HOSPITAL_COMMUNITY): Payer: Self-pay | Admitting: Pharmacy Technician

## 2014-02-23 ENCOUNTER — Encounter (HOSPITAL_COMMUNITY): Payer: Self-pay | Admitting: *Deleted

## 2014-02-23 NOTE — Progress Notes (Signed)
Patient called and message left for patient to arrive at 1130

## 2014-02-24 ENCOUNTER — Ambulatory Visit (HOSPITAL_COMMUNITY): Payer: Managed Care, Other (non HMO) | Admitting: Anesthesiology

## 2014-02-24 ENCOUNTER — Encounter (HOSPITAL_COMMUNITY): Payer: Self-pay | Admitting: *Deleted

## 2014-02-24 ENCOUNTER — Encounter (HOSPITAL_COMMUNITY)
Admission: RE | Disposition: A | Discharge status: Home / Self Care | Payer: Self-pay | Source: Ambulatory Visit | Attending: Neurological Surgery

## 2014-02-24 ENCOUNTER — Ambulatory Visit (HOSPITAL_COMMUNITY)
Admission: RE | Admit: 2014-02-24 | Discharge: 2014-02-25 | Disposition: A | Discharge status: Home / Self Care | Payer: Managed Care, Other (non HMO) | Source: Ambulatory Visit | Attending: Neurological Surgery | Admitting: Neurological Surgery

## 2014-02-24 ENCOUNTER — Encounter (HOSPITAL_COMMUNITY): Payer: Managed Care, Other (non HMO) | Admitting: Anesthesiology

## 2014-02-24 DIAGNOSIS — Z79899 Other long term (current) drug therapy: Secondary | ICD-10-CM | POA: Diagnosis not present

## 2014-02-24 DIAGNOSIS — Z7982 Long term (current) use of aspirin: Secondary | ICD-10-CM | POA: Insufficient documentation

## 2014-02-24 DIAGNOSIS — Y838 Other surgical procedures as the cause of abnormal reaction of the patient, or of later complication, without mention of misadventure at the time of the procedure: Secondary | ICD-10-CM | POA: Insufficient documentation

## 2014-02-24 DIAGNOSIS — T8132XA Disruption of internal operation (surgical) wound, not elsewhere classified, initial encounter: Secondary | ICD-10-CM | POA: Diagnosis not present

## 2014-02-24 DIAGNOSIS — T8131XD Disruption of external operation (surgical) wound, not elsewhere classified, subsequent encounter: Secondary | ICD-10-CM

## 2014-02-24 DIAGNOSIS — I10 Essential (primary) hypertension: Secondary | ICD-10-CM | POA: Insufficient documentation

## 2014-02-24 DIAGNOSIS — T81329A Deep disruption or dehiscence of operation wound, unspecified, initial encounter: Secondary | ICD-10-CM | POA: Insufficient documentation

## 2014-02-24 DIAGNOSIS — E119 Type 2 diabetes mellitus without complications: Secondary | ICD-10-CM | POA: Diagnosis not present

## 2014-02-24 HISTORY — PX: LUMBAR WOUND DEBRIDEMENT: SHX1988

## 2014-02-24 LAB — CBC
HEMATOCRIT: 39.1 % (ref 39.0–52.0)
Hemoglobin: 12.7 g/dL — ABNORMAL LOW (ref 13.0–17.0)
MCH: 29.7 pg (ref 26.0–34.0)
MCHC: 32.5 g/dL (ref 30.0–36.0)
MCV: 91.6 fL (ref 78.0–100.0)
Platelets: 230 10*3/uL (ref 150–400)
RBC: 4.27 MIL/uL (ref 4.22–5.81)
RDW: 13.3 % (ref 11.5–15.5)
WBC: 13.7 10*3/uL — ABNORMAL HIGH (ref 4.0–10.5)

## 2014-02-24 LAB — GLUCOSE, CAPILLARY
GLUCOSE-CAPILLARY: 74 mg/dL (ref 70–99)
Glucose-Capillary: 110 mg/dL — ABNORMAL HIGH (ref 70–99)
Glucose-Capillary: 80 mg/dL (ref 70–99)
Glucose-Capillary: 80 mg/dL (ref 70–99)
Glucose-Capillary: 110 mg/dL — ABNORMAL HIGH (ref 70–99)

## 2014-02-24 LAB — SURGICAL PCR SCREEN
MRSA, PCR: NEGATIVE
Staphylococcus aureus: NEGATIVE

## 2014-02-24 LAB — BASIC METABOLIC PANEL
Anion gap: 13 (ref 5–15)
BUN: 24 mg/dL — AB (ref 6–23)
CO2: 24 meq/L (ref 19–32)
CREATININE: 1.18 mg/dL (ref 0.50–1.35)
Calcium: 9.6 mg/dL (ref 8.4–10.5)
Chloride: 103 mEq/L (ref 96–112)
GFR calc Af Amer: 67 mL/min — ABNORMAL LOW (ref >90)
GFR, EST NON AFRICAN AMERICAN: 58 mL/min — AB (ref >90)
GLUCOSE: 103 mg/dL — AB (ref 70–99)
Potassium: 4.8 mEq/L (ref 3.7–5.3)
SODIUM: 140 meq/L (ref 137–147)

## 2014-02-24 SURGERY — LUMBAR WOUND DEBRIDEMENT
Anesthesia: General

## 2014-02-24 MED ORDER — DOXYCYCLINE HYCLATE 100 MG PO TABS
100.0000 mg | ORAL_TABLET | Freq: Once | ORAL | Status: AC
Start: 2014-02-24 — End: 2014-02-24
  Administered 2014-02-24: 100 mg via ORAL
  Filled 2014-02-24: qty 1

## 2014-02-24 MED ORDER — VITAMIN E 180 MG (400 UNIT) PO CAPS
400.0000 [IU] | ORAL_CAPSULE | Freq: Every day | ORAL | Status: DC
  Administered 2014-02-24: 400 [IU] via ORAL
  Filled 2014-02-24 (×2): qty 1

## 2014-02-24 MED ORDER — SODIUM CHLORIDE 0.9 % IJ SOLN
3.0000 mL | INTRAMUSCULAR | Status: DC | PRN
Start: 2014-02-24 — End: 2014-02-25

## 2014-02-24 MED ORDER — PHENOL 1.4 % MT LIQD: 1.0000 | OROMUCOSAL | Status: DC | PRN

## 2014-02-24 MED ORDER — VITAMIN C 500 MG PO TABS
500.0000 mg | ORAL_TABLET | Freq: Every day | ORAL | Status: DC
  Administered 2014-02-24: 500 mg via ORAL
  Filled 2014-02-24 (×2): qty 1

## 2014-02-24 MED ORDER — LACTATED RINGERS IV SOLN
INTRAVENOUS | Status: DC
  Administered 2014-02-24: 12:00:00 via INTRAVENOUS

## 2014-02-24 MED ORDER — MUPIROCIN 2 % EX OINT
TOPICAL_OINTMENT | CUTANEOUS | Status: AC
  Administered 2014-02-24: 1 via TOPICAL
  Filled 2014-02-24: qty 22

## 2014-02-24 MED ORDER — CEFAZOLIN SODIUM-DEXTROSE 2-3 GM-% IV SOLR
2.0000 g | INTRAVENOUS | Status: AC
  Administered 2014-02-24: 2 g via INTRAVENOUS

## 2014-02-24 MED ORDER — METHOCARBAMOL 500 MG PO TABS
500.0000 mg | ORAL_TABLET | Freq: Four times a day (QID) | ORAL | Status: DC | PRN
  Administered 2014-02-24 – 2014-02-25 (×2): 500 mg via ORAL
  Filled 2014-02-24 (×2): qty 1

## 2014-02-24 MED ORDER — ONDANSETRON HCL 4 MG/2ML IJ SOLN
INTRAMUSCULAR | Status: DC | PRN
  Administered 2014-02-24: 4 mg via INTRAVENOUS

## 2014-02-24 MED ORDER — FENTANYL CITRATE 0.05 MG/ML IJ SOLN
INTRAMUSCULAR | Status: DC | PRN
  Administered 2014-02-24 (×2): 50 ug via INTRAVENOUS

## 2014-02-24 MED ORDER — SODIUM CHLORIDE 0.9 % IJ SOLN
3.0000 mL | Freq: Two times a day (BID) | INTRAMUSCULAR | Status: DC
  Administered 2014-02-24: 3 mL via INTRAVENOUS

## 2014-02-24 MED ORDER — ACETAMINOPHEN 325 MG PO TABS: 650.0000 mg | ORAL_TABLET | ORAL | Status: DC | PRN

## 2014-02-24 MED ORDER — HYDROMORPHONE HCL PF 1 MG/ML IJ SOLN
INTRAMUSCULAR | Status: AC
  Administered 2014-02-24: 0.5 mg via INTRAVENOUS
  Filled 2014-02-24: qty 2

## 2014-02-24 MED ORDER — GLIMEPIRIDE 1 MG PO TABS
1.0000 mg | ORAL_TABLET | Freq: Every day | ORAL | Status: DC
  Administered 2014-02-25: 1 mg via ORAL
  Filled 2014-02-24 (×2): qty 1

## 2014-02-24 MED ORDER — SUCCINYLCHOLINE CHLORIDE 20 MG/ML IJ SOLN
INTRAMUSCULAR | Status: AC
  Filled 2014-02-24: qty 1

## 2014-02-24 MED ORDER — POTASSIUM CHLORIDE IN NACL 20-0.9 MEQ/L-% IV SOLN
INTRAVENOUS | Status: DC
  Filled 2014-02-24 (×3): qty 1000

## 2014-02-24 MED ORDER — PROPOFOL 10 MG/ML IV BOLUS
INTRAVENOUS | Status: DC | PRN
  Administered 2014-02-24: 150 mg via INTRAVENOUS

## 2014-02-24 MED ORDER — LACTATED RINGERS IV SOLN
INTRAVENOUS | Status: DC | PRN
  Administered 2014-02-24: 14:00:00 via INTRAVENOUS

## 2014-02-24 MED ORDER — ONDANSETRON HCL 4 MG/2ML IJ SOLN: 4.0000 mg | INTRAMUSCULAR | Status: DC | PRN

## 2014-02-24 MED ORDER — LIDOCAINE HCL (CARDIAC) 20 MG/ML IV SOLN
INTRAVENOUS | Status: DC | PRN
  Administered 2014-02-24: 60 mg via INTRAVENOUS
  Administered 2014-02-24: 40 mg via INTRAVENOUS

## 2014-02-24 MED ORDER — MORPHINE SULFATE 2 MG/ML IJ SOLN: 1.0000 mg | INTRAMUSCULAR | Status: DC | PRN

## 2014-02-24 MED ORDER — ONDANSETRON HCL 4 MG/2ML IJ SOLN
INTRAMUSCULAR | Status: AC
  Filled 2014-02-24: qty 2

## 2014-02-24 MED ORDER — FUROSEMIDE 40 MG PO TABS
40.0000 mg | ORAL_TABLET | Freq: Every day | ORAL | Status: DC
  Administered 2014-02-24: 40 mg via ORAL
  Filled 2014-02-24 (×2): qty 1

## 2014-02-24 MED ORDER — CEFAZOLIN SODIUM 1-5 GM-% IV SOLN
1.0000 g | Freq: Three times a day (TID) | INTRAVENOUS | Status: AC
  Administered 2014-02-24 – 2014-02-25 (×2): 1 g via INTRAVENOUS
  Filled 2014-02-24 (×2): qty 50

## 2014-02-24 MED ORDER — ARTIFICIAL TEARS OP OINT
TOPICAL_OINTMENT | OPHTHALMIC | Status: DC | PRN
  Administered 2014-02-24: 1 via OPHTHALMIC

## 2014-02-24 MED ORDER — SUCCINYLCHOLINE CHLORIDE 20 MG/ML IJ SOLN
INTRAMUSCULAR | Status: DC | PRN
  Administered 2014-02-24: 100 mg via INTRAVENOUS

## 2014-02-24 MED ORDER — MUPIROCIN 2 % EX OINT
1.0000 "application " | TOPICAL_OINTMENT | Freq: Once | CUTANEOUS | Status: AC
  Administered 2014-02-24: 1 via TOPICAL

## 2014-02-24 MED ORDER — FENTANYL CITRATE 0.05 MG/ML IJ SOLN
INTRAMUSCULAR | Status: AC
  Filled 2014-02-24: qty 5

## 2014-02-24 MED ORDER — OXYCODONE HCL 5 MG PO TABS
5.0000 mg | ORAL_TABLET | Freq: Once | ORAL | Status: DC | PRN
  Administered 2014-02-24: 5 mg via ORAL

## 2014-02-24 MED ORDER — ASPIRIN EC 81 MG PO TBEC
81.0000 mg | DELAYED_RELEASE_TABLET | Freq: Every day | ORAL | Status: DC
  Administered 2014-02-24: 81 mg via ORAL
  Filled 2014-02-24 (×2): qty 1

## 2014-02-24 MED ORDER — METOPROLOL TARTRATE 25 MG PO TABS
25.0000 mg | ORAL_TABLET | Freq: Two times a day (BID) | ORAL | Status: DC
  Administered 2014-02-24: 25 mg via ORAL
  Filled 2014-02-24 (×3): qty 1

## 2014-02-24 MED ORDER — PROPOFOL 10 MG/ML IV BOLUS
INTRAVENOUS | Status: AC
  Filled 2014-02-24: qty 20

## 2014-02-24 MED ORDER — CEFAZOLIN SODIUM-DEXTROSE 2-3 GM-% IV SOLR
INTRAVENOUS | Status: AC
  Filled 2014-02-24: qty 50

## 2014-02-24 MED ORDER — LISINOPRIL 40 MG PO TABS
40.0000 mg | ORAL_TABLET | Freq: Every morning | ORAL | Status: DC
  Filled 2014-02-24: qty 1

## 2014-02-24 MED ORDER — LIDOCAINE HCL (CARDIAC) 20 MG/ML IV SOLN
INTRAVENOUS | Status: AC
Start: 2014-02-24 — End: 2014-02-24
  Filled 2014-02-24: qty 5

## 2014-02-24 MED ORDER — METFORMIN HCL 500 MG PO TABS
1000.0000 mg | ORAL_TABLET | Freq: Two times a day (BID) | ORAL | Status: DC
  Administered 2014-02-24 – 2014-02-25 (×2): 1000 mg via ORAL
  Filled 2014-02-24 (×4): qty 2

## 2014-02-24 MED ORDER — MENTHOL 3 MG MT LOZG
1.0000 | LOZENGE | OROMUCOSAL | Status: DC | PRN
Start: 2014-02-24 — End: 2014-02-25

## 2014-02-24 MED ORDER — OXYCODONE-ACETAMINOPHEN 5-325 MG PO TABS
1.0000 | ORAL_TABLET | Freq: Four times a day (QID) | ORAL | Status: DC | PRN
  Administered 2014-02-24 (×2): 1 via ORAL
  Administered 2014-02-25: 2 via ORAL
  Filled 2014-02-24: qty 1
  Filled 2014-02-24: qty 2
  Filled 2014-02-24: qty 1

## 2014-02-24 MED ORDER — OXYCODONE HCL 5 MG/5ML PO SOLN: 5.0000 mg | Freq: Once | ORAL | Status: DC | PRN

## 2014-02-24 MED ORDER — HYDROMORPHONE HCL PF 1 MG/ML IJ SOLN
0.2500 mg | INTRAMUSCULAR | Status: DC | PRN
  Administered 2014-02-24 (×3): 0.5 mg via INTRAVENOUS

## 2014-02-24 MED ORDER — ARTIFICIAL TEARS OP OINT
TOPICAL_OINTMENT | OPHTHALMIC | Status: AC
  Filled 2014-02-24: qty 3.5

## 2014-02-24 MED ORDER — OXYCODONE HCL 5 MG PO TABS
ORAL_TABLET | ORAL | Status: AC
  Administered 2014-02-24: 5 mg via ORAL
  Filled 2014-02-24: qty 1

## 2014-02-24 MED ORDER — ACETAMINOPHEN 650 MG RE SUPP: 650.0000 mg | RECTAL | Status: DC | PRN

## 2014-02-24 MED ORDER — BACITRACIN 50000 UNITS IM SOLR
INTRAMUSCULAR | Status: DC | PRN
  Administered 2014-02-24: 15:00:00

## 2014-02-24 MED ORDER — 0.9 % SODIUM CHLORIDE (POUR BTL) OPTIME
TOPICAL | Status: DC | PRN
  Administered 2014-02-24: 1000 mL

## 2014-02-24 MED ORDER — PROMETHAZINE HCL 25 MG/ML IJ SOLN: 6.2500 mg | INTRAMUSCULAR | Status: DC | PRN

## 2014-02-24 SURGICAL SUPPLY — 47 items
BAG DECANTER FOR FLEXI CONT (MISCELLANEOUS) ×3 IMPLANT
BENZOIN TINCTURE PRP APPL 2/3 (GAUZE/BANDAGES/DRESSINGS) ×3 IMPLANT
CANISTER SUCT 3000ML (MISCELLANEOUS) ×3 IMPLANT
CLOSURE WOUND 1/2 X4 (GAUZE/BANDAGES/DRESSINGS)
DRAPE LAPAROTOMY 100X72X124 (DRAPES) ×3 IMPLANT
DRAPE POUCH INSTRU U-SHP 10X18 (DRAPES) ×3 IMPLANT
DRSG OPSITE 4X5.5 SM (GAUZE/BANDAGES/DRESSINGS) IMPLANT
DRSG OPSITE POSTOP 4X6 (GAUZE/BANDAGES/DRESSINGS) ×3 IMPLANT
DRSG TELFA 3X8 NADH (GAUZE/BANDAGES/DRESSINGS) IMPLANT
DURAPREP 26ML APPLICATOR (WOUND CARE) IMPLANT
DURAPREP 6ML APPLICATOR 50/CS (WOUND CARE) IMPLANT
ELECT REM PT RETURN 9FT ADLT (ELECTROSURGICAL) ×3
ELECTRODE REM PT RTRN 9FT ADLT (ELECTROSURGICAL) ×1 IMPLANT
GAUZE SPONGE 4X4 16PLY XRAY LF (GAUZE/BANDAGES/DRESSINGS) IMPLANT
GLOVE BIO SURGEON STRL SZ 6.5 (GLOVE) ×4 IMPLANT
GLOVE BIO SURGEON STRL SZ8 (GLOVE) ×3 IMPLANT
GLOVE BIO SURGEONS STRL SZ 6.5 (GLOVE) ×2
GLOVE BIOGEL PI IND STRL 7.0 (GLOVE) ×2 IMPLANT
GLOVE BIOGEL PI INDICATOR 7.0 (GLOVE) ×4
GLOVE SS BIOGEL STRL SZ 6.5 (GLOVE) ×2 IMPLANT
GLOVE SUPERSENSE BIOGEL SZ 6.5 (GLOVE) ×4
GOWN STRL REUS W/ TWL LRG LVL3 (GOWN DISPOSABLE) ×2 IMPLANT
GOWN STRL REUS W/ TWL XL LVL3 (GOWN DISPOSABLE) ×1 IMPLANT
GOWN STRL REUS W/TWL 2XL LVL3 (GOWN DISPOSABLE) IMPLANT
GOWN STRL REUS W/TWL LRG LVL3 (GOWN DISPOSABLE) ×6
GOWN STRL REUS W/TWL XL LVL3 (GOWN DISPOSABLE) ×3
KIT BASIN OR (CUSTOM PROCEDURE TRAY) ×3 IMPLANT
KIT ROOM TURNOVER OR (KITS) ×3 IMPLANT
NEEDLE HYPO 18GX1.5 BLUNT FILL (NEEDLE) IMPLANT
NEEDLE HYPO 25X1 1.5 SAFETY (NEEDLE) ×3 IMPLANT
NEEDLE SPNL 20GX3.5 QUINCKE YW (NEEDLE) IMPLANT
NS IRRIG 1000ML POUR BTL (IV SOLUTION) ×3 IMPLANT
PACK LAMINECTOMY NEURO (CUSTOM PROCEDURE TRAY) ×3 IMPLANT
PAD ARMBOARD 7.5X6 YLW CONV (MISCELLANEOUS) ×9 IMPLANT
STRIP CLOSURE SKIN 1/2X4 (GAUZE/BANDAGES/DRESSINGS) IMPLANT
SUT ETHILON 3 0 FSL (SUTURE) ×3 IMPLANT
SUT VIC AB 0 CT1 18XCR BRD8 (SUTURE) IMPLANT
SUT VIC AB 0 CT1 8-18 (SUTURE)
SUT VIC AB 2-0 CP2 18 (SUTURE) ×3 IMPLANT
SUT VIC AB 3-0 SH 8-18 (SUTURE) ×3 IMPLANT
SWAB CULTURE LIQ STUART DBL (MISCELLANEOUS) IMPLANT
SYR 20ML ECCENTRIC (SYRINGE) ×3 IMPLANT
SYR 3ML LL SCALE MARK (SYRINGE) IMPLANT
TOWEL OR 17X24 6PK STRL BLUE (TOWEL DISPOSABLE) ×3 IMPLANT
TOWEL OR 17X26 10 PK STRL BLUE (TOWEL DISPOSABLE) ×3 IMPLANT
TUBE ANAEROBIC SPECIMEN COL (MISCELLANEOUS) IMPLANT
WATER STERILE IRR 1000ML POUR (IV SOLUTION) ×3 IMPLANT

## 2014-02-24 NOTE — Progress Notes (Signed)
Willie in Neuro OR notified of CBG. Patient asymptomatic will continue to follow.

## 2014-02-24 NOTE — Anesthesia Preprocedure Evaluation (Addendum)
Anesthesia Evaluation  Patient identified by MRN, date of birth, ID band Patient awake    Reviewed: Allergy & Precautions, H&P , NPO status , Patient's Chart, lab work & pertinent test results, reviewed documented beta blocker date and time   Airway Mallampati: III TM Distance: >3 FB Neck ROM: Full    Dental  (+) Teeth Intact, Dental Advisory Given   Pulmonary neg pulmonary ROS,  breath sounds clear to auscultation        Cardiovascular hypertension, Pt. on medications and Pt. on home beta blockers Rhythm:Regular Rate:Normal     Neuro/Psych negative neurological ROS  negative psych ROS   GI/Hepatic negative GI ROS, Neg liver ROS,   Endo/Other  diabetes, Type 2, Oral Hypoglycemic Agents  Renal/GU negative Renal ROS  negative genitourinary   Musculoskeletal negative musculoskeletal ROS (+)   Abdominal   Peds  Hematology negative hematology ROS (+)   Anesthesia Other Findings   Reproductive/Obstetrics negative OB ROS                          Anesthesia Physical  Anesthesia Plan  ASA: III  Anesthesia Plan: General   Post-op Pain Management:    Induction: Intravenous  Airway Management Planned: Oral ETT  Additional Equipment: None  Intra-op Plan:   Post-operative Plan: Extubation in OR  Informed Consent: I have reviewed the patients History and Physical, chart, labs and discussed the procedure including the risks, benefits and alternatives for the proposed anesthesia with the patient or authorized representative who has indicated his/her understanding and acceptance.   Dental advisory given  Plan Discussed with: CRNA and Anesthesiologist  Anesthesia Plan Comments:         Anesthesia Quick Evaluation

## 2014-02-24 NOTE — Transfer of Care (Signed)
Immediate Anesthesia Transfer of Care Note  Patient: Brandon Santos  Procedure(s) Performed: Procedure(s) with comments: Revision of Lumbar Wound (N/A) - Revision of Lumbar Wound  Patient Location: PACU  Anesthesia Type:General  Level of Consciousness: awake, alert , oriented and sedated  Airway & Oxygen Therapy: Patient Spontanous Breathing and Patient connected to nasal cannula oxygen  Post-op Assessment: Report given to PACU RN, Post -op Vital signs reviewed and stable and Patient moving all extremities  Post vital signs: Reviewed and stable  Complications: No apparent anesthesia complications

## 2014-02-24 NOTE — Plan of Care (Signed)
Problem: Consults Goal: Diagnosis - Spinal Surgery Outcome: Completed/Met Date Met:  02/24/14 REVISION OF LUMBAR WOUND

## 2014-02-24 NOTE — Op Note (Signed)
02/24/2014  3:32 PM  PATIENT:  Brandon Santos  80 y.o. male  PRE-OPERATIVE DIAGNOSIS:   Lumbar wound dehiscence  POST-OPERATIVE DIAGNOSIS:  Same  PROCEDURE:  Revision of lumbar wound with irrigation and debridement and primary closure  SURGEON:  Sherley Bounds, MD  ASSISTANTS: None  ANESTHESIA:   General  EBL: Minimal ml  Total I/O In: 800 [I.V.:800] Out: 15 [Blood:15]  BLOOD ADMINISTERED:none  DRAINS: None   SPECIMEN:  No Specimen  INDICATION FOR PROCEDURE: This patient underwent a lumbar fusion several weeks ago. He developed a lumbar wound dehiscence without evidence of significant infection. He presents today for revision of lumbar wound. Patient understood the risks, benefits, and alternatives and potential outcomes and wished to proceed.  PROCEDURE DETAILS: The patient was taken to the operating room and after induction of adequate generalized endotracheal anesthesia, the patient was rolled into the prone position on the Wilson frame and all pressure points were padded. The lumbar region was cleaned and then prepped with Betadine and draped in the usual sterile fashion. 5 cc of local anesthesia was injected and then the edges of his old incision were ellipsed out and the fibrinous tissue was removed and dissection was carried down to the lumbo sacral fascia. There was no break in the fascia. There was no evidence of exudate. I debrided the wound edges and then irrigated with copious amounts of bacitracin-containing saline solution.  I closed the subcutaneous tissues with 2-0 Vicryl and the subcuticular tissues with 3-0 Vicryl. The skin was then closed with a running 3-0 Ethilon suture. The drapes were removed, a sterile dressing was applied. The patient was awakened from general anesthesia and transferred to the recovery room in stable condition. At the end of the procedure all sponge, needle and instrument counts were correct.   PLAN OF CARE: Admit for overnight  observation  PATIENT DISPOSITION:  PACU - hemodynamically stable.   Delay start of Pharmacological VTE agent (>24hrs) due to surgical blood loss or risk of bleeding:  yes

## 2014-02-24 NOTE — H&P (Signed)
Subjective: Patient is a 80 y.o. male admitted for lumbar wound revision for lumbar dehiscence. Onset of symptoms was 2 weeks ago, gradually worsening since that time.  The pain is rated mild, and is located at the across the lower back but this is expected so soon after lumbar fusion surgery. The pain is described as aching and occurs intermittently. The symptoms have been progressive. Symptoms are exacerbated by exercise.  wed Past Medical History  Diagnosis Date  . Hypertension   . Diabetes mellitus without complication     Past Surgical History  Procedure Laterality Date  . Appendectomy  1966  . Colonoscopy  2013    had it close to end of year, exam was normal  . Drain fluid from lungs  1938    states "I was 80 year old"  . Maximum access (mas)posterior lumbar interbody fusion (plif) 1 level N/A 01/21/2014    Procedure: FOR MAXIMUM ACCESS (MAS) POSTERIOR LUMBAR INTERBODY FUSION (PLIF) LUMBAR FOUR FIVE;  Surgeon: Eustace Moore, MD;  Location: [facility];  Service: Neurosurgery;  Laterality: N/A;    Prior to Admission medications   Medication Sig Start Date End Date Taking? Authorizing Provider  ascorbic acid (VITAMIN C) 500 MG tablet Take 500 mg by mouth daily. With Rose Tips   Yes Historical Provider, MD  aspirin EC 81 MG tablet Take 81 mg by mouth daily.   Yes Historical Provider, MD  B Complex Vitamins (B COMPLEX 50) TABS Take 1 tablet by mouth daily.    Yes Historical Provider, MD  Calcium-Vitamin D (CALTRATE 600 PLUS-VIT D PO) Take 1 tablet by mouth daily.    Yes Historical Provider, MD  Cholecalciferol (VITAMIN D-3) 1000 UNITS CAPS Take 1,000 Units by mouth daily.   Yes Historical Provider, MD  doxycycline (ADOXA) 100 MG tablet Take 100 mg by mouth 2 (two) times daily.   Yes Historical Provider, MD  furosemide (LASIX) 40 MG tablet Take 1 tablet (40 mg total) by mouth daily. 08/17/13  Yes Neena Rhymes, MD  glimepiride (AMARYL) 2 MG tablet Take 1 mg by mouth daily with  breakfast.    Yes Historical Provider, MD  glucose blood (ONETOUCH VERIO) test strip Use as instructed to check blood sugar once a day. 02/05/14  Yes Lucretia Kern, DO  lisinopril (PRINIVIL,ZESTRIL) 40 MG tablet Take 40 mg by mouth every morning.   Yes Historical Provider, MD  metFORMIN (GLUCOPHAGE) 1000 MG tablet Take 1,000 mg by mouth 2 (two) times daily with a meal.   Yes Historical Provider, MD  metoprolol tartrate (LOPRESSOR) 25 MG tablet Take 1 tablet (25 mg total) by mouth 2 (two) times daily. 08/17/13  Yes Neena Rhymes, MD  Multiple Vitamins-Minerals (CENTRUM PO) Take 1 tablet by mouth daily.    Yes Historical Provider, MD  Jonetta Speak LANCETS 57Q MISC Use as directed to check blood sugar once a day 02/05/14  Yes Lucretia Kern, DO  oxyCODONE-acetaminophen (PERCOCET/ROXICET) 5-325 MG per tablet Take 1-2 tablets by mouth every 6 (six) hours as needed for moderate pain. 01/22/14  Yes Eustace Moore, MD  simvastatin (ZOCOR) 20 MG tablet Take 1 tablet (20 mg total) by mouth every evening. 11/02/13  Yes Lucretia Kern, DO  vitamin E 400 UNIT capsule Take 400 Units by mouth daily.   Yes Historical Provider, MD   No Known Allergies  History  Substance Use Topics  . Smoking status: Never Smoker   . Smokeless tobacco: Never Used  .  Alcohol Use: No     Comment: only drinks at Party/Holidays    Family History  Problem Relation Age of Onset  . Colon cancer Neg Hx   . Heart disease Father      Review of Systems  Positive ROS: neg  All other systems have been reviewed and were otherwise negative with the exception of those mentioned in the HPI and as above.  Objective: Vital signs in last 24 hours: Temp:  [97.4 F (36.3 C)] 97.4 F (36.3 C) (09/02 1152) Pulse Rate:  [71] 71 (09/02 1152) Resp:  [18] 18 (09/02 1152) BP: (199)/(88) 199/88 mmHg (09/02 1152) SpO2:  [100 %] 100 % (09/02 1152) Weight:  [78.019 kg (172 lb)] 78.019 kg (172 lb) (09/02 1152)  General Appearance: Alert,  cooperative, no distress, appears stated age Head: Normocephalic, without obvious abnormality, atraumatic Eyes: PERRL, conjunctiva/corneas clear, EOM's intact    Neck: Supple, symmetrical, trachea midline Back: Symmetric, no curvature, ROM normal, no CVA tenderness Lungs:  respirations unlabored Heart: Regular rate and rhythm Abdomen: Soft, non-tender Extremities: Extremities normal, atraumatic, no cyanosis or edema Pulses: 2+ and symmetric all extremities Skin: Skin color, texture, turgor normal, no rashes or lesions  NEUROLOGIC:   Mental status: Alert and oriented x4,  no aphasia, good attention span, fund of knowledge, and memory Motor Exam - grossly normal Sensory Exam - grossly normal Reflexes: 1+ Coordination - grossly normal Gait - grossly normal Balance - grossly normal Cranial Nerves: I: smell Not tested  II: visual acuity  OS: nl    OD: nl  II: visual fields Full to confrontation  II: pupils Equal, round, reactive to light  III,VII: ptosis None  III,IV,VI: extraocular muscles  Full ROM  V: mastication Normal  V: facial light touch sensation  Normal  V,VII: corneal reflex  Present  VII: facial muscle function - upper  Normal  VII: facial muscle function - lower Normal  VIII: hearing Not tested  IX: soft palate elevation  Normal  IX,X: gag reflex Present  XI: trapezius strength  5/5  XI: sternocleidomastoid strength 5/5  XI: neck flexion strength  5/5  XII: tongue strength  Normal    Data Review Lab Results  Component Value Date   WBC 13.7* 02/24/2014   HGB 12.7* 02/24/2014   HCT 39.1 02/24/2014   MCV 91.6 02/24/2014   PLT 230 02/24/2014   Lab Results  Component Value Date   NA 140 02/24/2014   K 4.8 02/24/2014   CL 103 02/24/2014   CO2 24 02/24/2014   BUN 24* 02/24/2014   CREATININE 1.18 02/24/2014   GLUCOSE 103* 02/24/2014   No results found for this basename: INR, PROTIME    Assessment/Plan: Patient admitted for lumbar wound revision. Patient has failed a  reasonable attempt at conservative therapy.  I explained the condition and procedure to the patient and answered any questions.  Patient wishes to proceed with procedure as planned. Understands risks/ benefits and typical outcomes of procedure.   Eniya Cannady S 02/24/2014 2:30 PM

## 2014-02-25 DIAGNOSIS — T8132XA Disruption of internal operation (surgical) wound, not elsewhere classified, initial encounter: Secondary | ICD-10-CM | POA: Diagnosis not present

## 2014-02-25 LAB — GLUCOSE, CAPILLARY: Glucose-Capillary: 96 mg/dL (ref 70–99)

## 2014-02-25 MED ORDER — OXYCODONE-ACETAMINOPHEN 5-325 MG PO TABS
1.0000 | ORAL_TABLET | ORAL | Status: DC | PRN
  Administered 2014-02-25: 2 via ORAL
  Filled 2014-02-25: qty 2

## 2014-02-25 MED ORDER — OXYCODONE-ACETAMINOPHEN 5-325 MG PO TABS: 1.0000 | ORAL_TABLET | Freq: Four times a day (QID) | ORAL | Status: DC | PRN

## 2014-02-25 NOTE — Progress Notes (Signed)
Pt is doing well. Pt and wife given D/C instructions with Rx, verbal understanding was given. Pt's IV was removed prior to D/C. Pt's incision is covered with honeycomb dressing with a minimal amount of drainage. Pt D/C'd home via walking per MD order. Pt is stable @ D/C and has no other needs at this time. Holli Humbles, RN

## 2014-02-25 NOTE — Discharge Summary (Signed)
Physician Discharge Summary  Patient ID: Brandon Santos MRN: [id number] DOB/AGE: Jun 06, 1934 80 y.o.  Admit date: 02/24/2014 Discharge date: 02/25/2014  Admission Diagnoses: wound dehiscence    Discharge Diagnoses: same   Discharged Condition: good  Hospital Course: The patient was admitted on 02/24/2014 and taken to the operating room where the patient underwent wound revision. The patient tolerated the procedure well and was taken to the recovery room and then to the floor in stable condition. The hospital course was routine. There were no complications. The wound remained clean dry and intact. Pt had appropriate back soreness. No complaints of leg pain or new N/T/W. The patient remained afebrile with stable vital signs, and tolerated a regular diet. The patient continued to increase activities, and pain was well controlled with oral pain medications.   Consults: None  Significant Diagnostic Studies:  Results for orders placed during the hospital encounter of 02/24/14  SURGICAL PCR SCREEN      Result Value Ref Range   MRSA, PCR NEGATIVE  NEGATIVE   Staphylococcus aureus NEGATIVE  NEGATIVE  BASIC METABOLIC PANEL      Result Value Ref Range   Sodium 140  137 - 147 mEq/L   Potassium 4.8  3.7 - 5.3 mEq/L   Chloride 103  96 - 112 mEq/L   CO2 24  19 - 32 mEq/L   Glucose, Bld 103 (*) 70 - 99 mg/dL   BUN 24 (*) 6 - 23 mg/dL   Creatinine, Ser 1.18  0.50 - 1.35 mg/dL   Calcium 9.6  8.4 - 10.5 mg/dL   GFR calc non Af Amer 58 (*) >90 mL/min   GFR calc Af Amer 67 (*) >90 mL/min   Anion gap 13  5 - 15  CBC      Result Value Ref Range   WBC 13.7 (*) 4.0 - 10.5 K/uL   RBC 4.27  4.22 - 5.81 MIL/uL   Hemoglobin 12.7 (*) 13.0 - 17.0 g/dL   HCT 39.1  39.0 - 52.0 %   MCV 91.6  78.0 - 100.0 fL   MCH 29.7  26.0 - 34.0 pg   MCHC 32.5  30.0 - 36.0 g/dL   RDW 13.3  11.5 - 15.5 %   Platelets 230  150 - 400 K/uL  GLUCOSE, CAPILLARY      Result Value Ref Range   Glucose-Capillary 110 (*) 70 - 99  mg/dL  GLUCOSE, CAPILLARY      Result Value Ref Range   Glucose-Capillary 74  70 - 99 mg/dL  GLUCOSE, CAPILLARY      Result Value Ref Range   Glucose-Capillary 80  70 - 99 mg/dL  GLUCOSE, CAPILLARY      Result Value Ref Range   Glucose-Capillary 80  70 - 99 mg/dL  GLUCOSE, CAPILLARY      Result Value Ref Range   Glucose-Capillary 110 (*) 70 - 99 mg/dL   Comment 1 Notify RN     Comment 2 Documented in Chart      No results found.  Antibiotics:  Anti-infectives   Start     Dose/Rate Route Frequency Ordered Stop   02/24/14 2200  ceFAZolin (ANCEF) IVPB 1 g/50 mL premix     1 g 100 mL/hr over 30 Minutes Intravenous Every 8 hours 02/24/14 1650 02/25/14 0538   02/24/14 2030  doxycycline (VIBRA-TABS) tablet 100 mg     100 mg Oral  Once 02/24/14 2018 02/24/14 2137   02/24/14 1511  bacitracin 50,000 Units in sodium  chloride irrigation 0.9 % 500 mL irrigation  Status:  Discontinued       As needed 02/24/14 1511 02/24/14 1627   02/24/14 1133  ceFAZolin (ANCEF) 2-3 GM-% IVPB SOLR    Comments:  Henrine Screws   : cabinet override      02/24/14 1133 02/24/14 2344   02/24/14 1130  ceFAZolin (ANCEF) IVPB 2 g/50 mL premix     2 g 100 mL/hr over 30 Minutes Intravenous On call to O.R. 02/24/14 1126 02/24/14 1447      Discharge Exam: Blood pressure 109/65, pulse 62, temperature 98.1 F (36.7 C), temperature source Oral, resp. rate 18, height 5\' 6"  (1.676 m), weight 78.019 kg (172 lb), SpO2 94.00%. Neurologic: Grossly normal Dressing dry  Discharge Medications:     Medication List         ascorbic acid 500 MG tablet  Commonly known as:  VITAMIN C  Take 500 mg by mouth daily. With Rose Tips     aspirin EC 81 MG tablet  Take 81 mg by mouth daily.     B COMPLEX 50 Tabs  Take 1 tablet by mouth daily.     CALTRATE 600 PLUS-VIT D PO  Take 1 tablet by mouth daily.     CENTRUM PO  Take 1 tablet by mouth daily.     doxycycline 100 MG tablet  Commonly known as:  ADOXA  Take 100 mg by  mouth 2 (two) times daily.     furosemide 40 MG tablet  Commonly known as:  LASIX  Take 1 tablet (40 mg total) by mouth daily.     glimepiride 2 MG tablet  Commonly known as:  AMARYL  Take 1 mg by mouth daily with breakfast.     glucose blood test strip  Commonly known as:  ONETOUCH VERIO  Use as instructed to check blood sugar once a day.     lisinopril 40 MG tablet  Commonly known as:  PRINIVIL,ZESTRIL  Take 40 mg by mouth every morning.     metFORMIN 1000 MG tablet  Commonly known as:  GLUCOPHAGE  Take 1,000 mg by mouth 2 (two) times daily with a meal.     metoprolol tartrate 25 MG tablet  Commonly known as:  LOPRESSOR  Take 1 tablet (25 mg total) by mouth 2 (two) times daily.     ONETOUCH DELICA LANCETS 30S Misc  Use as directed to check blood sugar once a day     oxyCODONE-acetaminophen 5-325 MG per tablet  Commonly known as:  PERCOCET/ROXICET  Take 1-2 tablets by mouth every 6 (six) hours as needed for moderate pain.     simvastatin 20 MG tablet  Commonly known as:  ZOCOR  Take 1 tablet (20 mg total) by mouth every evening.     Vitamin D-3 1000 UNITS Caps  Take 1,000 Units by mouth daily.     vitamin E 400 UNIT capsule  Take 400 Units by mouth daily.        Disposition: home   Final Dx: wound revision      Discharge Instructions   Call MD for:  difficulty breathing, headache or visual disturbances    Complete by:  As directed      Call MD for:  persistant nausea and vomiting    Complete by:  As directed      Call MD for:  redness, tenderness, or signs of infection (pain, swelling, redness, odor or green/yellow discharge around incision site)    Complete  by:  As directed      Call MD for:  severe uncontrolled pain    Complete by:  As directed      Call MD for:  temperature >100.4    Complete by:  As directed      Diet - low sodium heart healthy    Complete by:  As directed      Discharge instructions    Complete by:  As directed   May shower  normally     Increase activity slowly    Complete by:  As directed            Follow-up Information   Follow up with Sullivan Jacuinde S, MD. Schedule an appointment as soon as possible for a visit in 2 weeks.   Specialty:  Neurosurgery   Contact information:   [address] [place] [id number] [phone]        Signed: Eustace Moore 02/25/2014, 7:35 AM

## 2014-02-26 ENCOUNTER — Encounter (HOSPITAL_COMMUNITY): Payer: Self-pay | Admitting: Neurological Surgery

## 2014-02-26 NOTE — Anesthesia Postprocedure Evaluation (Signed)
  Anesthesia Post-op Note  Patient: Brandon Santos  Procedure(s) Performed: Procedure(s) with comments: Revision of Lumbar Wound (N/A) - Revision of Lumbar Wound  Patient Location: PACU  Anesthesia Type:General  Level of Consciousness: awake, alert  and oriented  Airway and Oxygen Therapy: Patient Spontanous Breathing  Post-op Pain: none  Post-op Assessment: Post-op Vital signs reviewed  Post-op Vital Signs: Reviewed  Last Vitals:  Filed Vitals:   02/25/14 0751  BP: 115/62  Pulse: 47  Temp: 36.4 C  Resp: 18    Complications: No apparent anesthesia complications

## 2014-03-31 ENCOUNTER — Ambulatory Visit: Payer: Managed Care, Other (non HMO) | Admitting: Family Medicine

## 2014-04-21 ENCOUNTER — Ambulatory Visit (INDEPENDENT_AMBULATORY_CARE_PROVIDER_SITE_OTHER): Payer: Managed Care, Other (non HMO)

## 2014-04-21 DIAGNOSIS — Z23 Encounter for immunization: Secondary | ICD-10-CM

## 2014-05-07 DIAGNOSIS — Z0279 Encounter for issue of other medical certificate: Secondary | ICD-10-CM

## 2014-08-09 ENCOUNTER — Telehealth: Payer: Self-pay

## 2014-08-09 NOTE — Telephone Encounter (Signed)
[facility] refill request for SIMVASTATIN

## 2014-08-09 NOTE — Telephone Encounter (Signed)
Also GLIMEPIRIDE, METOPROLOL, FUROSEMIDE,LISINOPRIL, and METFORMIN

## 2014-08-10 MED ORDER — LISINOPRIL 40 MG PO TABS: 40.0000 mg | ORAL_TABLET | Freq: Every morning | ORAL | Status: DC

## 2014-08-10 MED ORDER — SIMVASTATIN 20 MG PO TABS: 20.0000 mg | ORAL_TABLET | Freq: Every evening | ORAL | Status: DC

## 2014-08-10 MED ORDER — GLIMEPIRIDE 2 MG PO TABS: 1.0000 mg | ORAL_TABLET | Freq: Every day | ORAL | Status: DC

## 2014-08-10 MED ORDER — FUROSEMIDE 40 MG PO TABS: 40.0000 mg | ORAL_TABLET | Freq: Every day | ORAL | Status: DC

## 2014-08-10 MED ORDER — METFORMIN HCL 1000 MG PO TABS: 1000.0000 mg | ORAL_TABLET | Freq: Two times a day (BID) | ORAL | Status: DC

## 2014-08-10 MED ORDER — METOPROLOL TARTRATE 25 MG PO TABS: 25.0000 mg | ORAL_TABLET | Freq: Two times a day (BID) | ORAL | Status: DC

## 2014-08-10 NOTE — Telephone Encounter (Signed)
Pt has cpx sch for 10-06-14. Pt will come in fasting

## 2014-08-10 NOTE — Telephone Encounter (Signed)
Rxs done. 

## 2014-08-10 NOTE — Telephone Encounter (Signed)
Needs follow up appt in next 1-2 months, morning appt, come fasting - please refill meds to get to appointment. Thanks.

## 2014-10-06 ENCOUNTER — Ambulatory Visit (INDEPENDENT_AMBULATORY_CARE_PROVIDER_SITE_OTHER): Payer: BLUE CROSS/BLUE SHIELD | Admitting: Family Medicine

## 2014-10-06 ENCOUNTER — Encounter: Payer: Self-pay | Admitting: Family Medicine

## 2014-10-06 VITALS — BP 142/98 | HR 71 | Temp 97.4°F | Ht 66.5 in | Wt 187.1 lb

## 2014-10-06 DIAGNOSIS — E785 Hyperlipidemia, unspecified: Secondary | ICD-10-CM

## 2014-10-06 DIAGNOSIS — Z0000 Encounter for general adult medical examination without abnormal findings: Secondary | ICD-10-CM

## 2014-10-06 DIAGNOSIS — Z23 Encounter for immunization: Secondary | ICD-10-CM

## 2014-10-06 DIAGNOSIS — I10 Essential (primary) hypertension: Secondary | ICD-10-CM | POA: Diagnosis not present

## 2014-10-06 DIAGNOSIS — E119 Type 2 diabetes mellitus without complications: Secondary | ICD-10-CM

## 2014-10-06 LAB — BASIC METABOLIC PANEL
BUN: 20 mg/dL (ref 6–23)
CO2: 28 meq/L (ref 19–32)
Calcium: 10 mg/dL (ref 8.4–10.5)
Chloride: 103 mEq/L (ref 96–112)
Creatinine, Ser: 1.32 mg/dL (ref 0.40–1.50)
GFR: 55.7 mL/min — ABNORMAL LOW (ref >60.00)
Glucose, Bld: 91 mg/dL (ref 70–99)
POTASSIUM: 4.6 meq/L (ref 3.5–5.1)
Sodium: 139 mEq/L (ref 135–145)

## 2014-10-06 LAB — LIPID PANEL
CHOLESTEROL: 147 mg/dL (ref 0–200)
HDL: 42.2 mg/dL (ref >39.00)
LDL Cholesterol: 67 mg/dL (ref 0–99)
NONHDL: 104.8
Total CHOL/HDL Ratio: 3
Triglycerides: 191 mg/dL — ABNORMAL HIGH (ref 0.0–149.0)
VLDL: 38.2 mg/dL (ref 0.0–40.0)

## 2014-10-06 LAB — HEMOGLOBIN A1C: Hgb A1c MFr Bld: 6.4 % (ref 4.6–6.5)

## 2014-10-06 MED ORDER — METFORMIN HCL 1000 MG PO TABS: 1000.0000 mg | ORAL_TABLET | Freq: Two times a day (BID) | ORAL | Status: DC

## 2014-10-06 MED ORDER — METOPROLOL TARTRATE 25 MG PO TABS: 25.0000 mg | ORAL_TABLET | Freq: Two times a day (BID) | ORAL | Status: DC

## 2014-10-06 MED ORDER — LISINOPRIL-HYDROCHLOROTHIAZIDE 20-12.5 MG PO TABS: 2.0000 | ORAL_TABLET | Freq: Every day | ORAL | Status: DC

## 2014-10-06 MED ORDER — SIMVASTATIN 20 MG PO TABS: 20.0000 mg | ORAL_TABLET | Freq: Every evening | ORAL | Status: DC

## 2014-10-06 MED ORDER — GLIMEPIRIDE 2 MG PO TABS: 1.0000 mg | ORAL_TABLET | Freq: Every day | ORAL | Status: DC

## 2014-10-06 NOTE — Progress Notes (Signed)
HPI:  Here for CPE:  Concerns and/or follow up today: he specifically asks that this be coded as an annual exam and not as a medicare exam.  Authur Santos is a 81 yo pleasant gentleman here for his well visit and preventive care whom has not been in to see [place] in quite some time. He has a PMH of well controlled diabetes, HTN, Hyperlipidemia and LE edema. He reports he feels "very fine." Reports he has recovered well from surgery for spinal stenosis and is doing great. He reports his fasting BS at home is around 106. Has eye exam next week.  HTN: -blood pressure is elevated today and he reports has been elevated at home for several months -meds: lasix 40mg  daily, metoprolol tartrate 25mg  bid, lisionpril 40mg  daily, asa daily -he reports his prior PCP put him on lasix a very longtime ago for blood pressure only - denies hx edema - this make him pee in the morning and he wants to stop lasix, CHF, etc -denies: CP, SOB, DOE, swelling, HA, vision changes  -Diet: variety of foods, balance and well rounded - limits sweets and carbs  -Exercise: regular exercise - reclining bike 4 days per week for 20 minutes  -Diabetes and Dyslipidemia Screening: doing today. FASTING today.  -Hx of HTN: treated  -Vaccines: UTD  -sexual activity: yes, male partner, no new partners  -wants STI testing, Hep C screening (if born 36-1963): no  -FH colon or prstate ca: see FH Last colon cancer screening: reports due to age he has opted for no further colon cancer screening Last prostate ca screening: declined, n/a  -Alcohol, Tobacco, drug use: see social history  Review of Systems - no fevers, unintentional weight loss, vision loss, hearing loss, chest pain, sob, hemoptysis, melena, hematochezia, hematuria, genital discharge, changing or concerning skin lesions, bleeding, bruising, loc, thoughts of self harm or SI  Past Medical History  Diagnosis Date  . Hypertension   . Diabetes mellitus without  complication   . Hyperlipemia   . BPH (benign prostatic hyperplasia)   . Hemorrhoid   . Colon polyp   . Spinal stenosis     Past Surgical History  Procedure Laterality Date  . Appendectomy  1966  . Colonoscopy  2013    had it close to end of year, exam was normal  . Drain fluid from lungs  1938    states "I was 81 year old"  . Maximum access (mas)posterior lumbar interbody fusion (plif) 1 level N/A 01/21/2014    Procedure: FOR MAXIMUM ACCESS (MAS) POSTERIOR LUMBAR INTERBODY FUSION (PLIF) LUMBAR FOUR FIVE;  Surgeon: Eustace Moore, MD;  Location: [place] [facility];  Service: Neurosurgery;  Laterality: N/A;  . Lumbar wound debridement N/A 02/24/2014    Procedure: Revision of Lumbar Wound;  Surgeon: Eustace Moore, MD;  Location: [place] [facility];  Service: Neurosurgery;  Laterality: N/A;  Revision of Lumbar Wound    Family History  Problem Relation Age of Onset  . Colon cancer Neg Hx   . Heart disease Father     History   Social History  . Marital Status: Married    Spouse Name: N/A  . Number of Children: N/A  . Years of Education: N/A   Social History Main Topics  . Smoking status: Never Smoker   . Smokeless tobacco: Never Used  . Alcohol Use: No     Comment: only drinks at Party/Holidays  . Drug Use: No  . Sexual Activity: Not on file  Other Topics Concern  . 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  ascorbic acid (VITAMIN C) 500 MG tablet, Take 500 mg by mouth daily. With [employer], Disp: , Rfl:  .  aspirin EC 81 MG tablet, Take 81 mg by mouth daily., Disp: , Rfl:  .  B Complex Vitamins (B COMPLEX 50) TABS, Take 1 tablet by mouth daily. , Disp: , Rfl:  .  Calcium-Vitamin D (CALTRATE 600 PLUS-VIT D PO), Take 1 tablet by mouth daily. , Disp: , Rfl:   .  Cholecalciferol (VITAMIN D-3) 1000 UNITS CAPS, Take 1,000 Units by mouth daily., Disp: , Rfl:  .  glimepiride (AMARYL) 2 MG tablet, Take 0.5 tablets (1 mg total) by mouth daily with breakfast., Disp: 90 tablet, Rfl: 3 .  glucose blood (ONETOUCH VERIO) test strip, Use as instructed to check blood sugar once a day., Disp: 100 each, Rfl: 3 .  metFORMIN (GLUCOPHAGE) 1000 MG tablet, Take 1 tablet (1,000 mg total) by mouth 2 (two) times daily with a meal., Disp: 180 tablet, Rfl: 3 .  metoprolol tartrate (LOPRESSOR) 25 MG tablet, Take 1 tablet (25 mg total) by mouth 2 (two) times daily., Disp: 180 tablet, Rfl: 3 .  Multiple Vitamins-Minerals (CENTRUM PO), Take 1 tablet by mouth daily. , Disp: , Rfl:  .  ONETOUCH DELICA LANCETS 32K MISC, Use as directed to check blood sugar once a day, Disp: 100 each, Rfl: 3 .  simvastatin (ZOCOR) 20 MG tablet, Take 1 tablet (20 mg total) by mouth every evening., Disp: 90 tablet, Rfl: 3 .  vitamin E 400 UNIT capsule, Take 400 Units by mouth daily., Disp: , Rfl:  .  lisinopril-hydrochlorothiazide (PRINZIDE,ZESTORETIC) 20-12.5 MG per tablet, Take 2 tablets by mouth daily., Disp: 60 tablet, Rfl: 0  EXAM:  Filed Vitals:   10/06/14 1118  BP: 142/98  Pulse: 71  Temp: 97.4 F (36.3 C)  TempSrc: Oral  Height: 5' 6.5" (1.689 m)  Weight: 187 lb 1.6 oz (84.868 kg)    Estimated body mass index is 29.75 kg/(m^2) as calculated from the following:   Height as of this encounter: 5' 6.5" (1.689 m).   Weight as of this encounter: 187 lb 1.6 oz (84.868 kg).  GENERAL: vitals reviewed and listed below, alert, oriented, appears well hydrated and in no acute distress  HEENT: head atraumatic, PERRLA, normal appearance of eyes, ears, nose and mouth. moist mucus membranes.  NECK: supple, no masses or lymphadenopathy  LUNGS: clear to auscultation bilaterally, no rales, rhonchi or wheeze  CV: HRRR, no peripheral edema or cyanosis, normal pedal pulses  ABDOMEN: bowel sounds  normal, soft, non tender to palpation, no masses, no rebound or guarding  GU: declined  RECTAL: declined  SKIN: no rash or abnormal lesions  MS: normal gait, moves all extremities normally  NEURO: CN II-XII grossly intact, normal muscle strength and sensation to light touch on extremities  PSYCH: normal affect, pleasant and cooperative  FOOT EXAM: normal  ASSESSMENT AND PLAN:  Discussed the following assessment and plan:  1. Essential hypertension -he wants to stop lasix and try something else - discussed options and will do trial hctz/lisionpril and  continue his BB, may add norvasc if needed -follow up in 3 weeks to recheck then send meds to mail pharmacy -labs today -lifestyle recs  2. Hyperlipemia -lipid panel -cont statin  3. Type 2 diabetes mellitus without complication -sees eye doctor next week -cont medications and lifestye recs -hgba1c today  4. Visit for preventive health examination -Discussed and advised all [place] preventive services health task force level A and B recommendations for age, sex and risks.  -Advised at least 150 minutes of exercise per week and a healthy diet low in saturated fats and sweets and consisting of fresh fruits and vegetables, lean meats such as fish and white chicken and whole grains.  -FASTING labs, studies and vaccines per orders this encounter  -sees scanned document as well for preventive visit intake and plan   Patient advised to return to clinic immediately if symptoms worsen or persist or new concerns.  Patient Instructions  BEFORE YOU LEAVE: -fasting labs -prevnar 13 -follow up in 3 weeks for blood pressure  STOP lasix STOP the lisinopril  Start the new medication lisinopril - hctz combination medication (2 tablets once daily in the morning)  We recommend the following healthy lifestyle measures: - eat a healthy diet consisting of lots of vegetables, fruits, beans, nuts, seeds, healthy meats such as white chicken  and fish and whole grains.  - avoid fried foods, fast food, processed foods, sodas, red meet and other fattening foods.  - get a least 150 minutes of aerobic exercise per week.      Return in about 3 weeks (around 10/27/2014) for f/u HTN.   Sandra Tellefsen R.

## 2014-10-06 NOTE — Patient Instructions (Addendum)
BEFORE YOU LEAVE: -fasting labs -prevnar 13 -follow up in 3 weeks for blood pressure  STOP lasix STOP the lisinopril  Start the new medication lisinopril - hctz combination medication (2 tablets once daily in the morning)  We recommend the following healthy lifestyle measures: - eat a healthy diet consisting of lots of vegetables, fruits, beans, nuts, seeds, healthy meats such as white chicken and fish and whole grains.  - avoid fried foods, fast food, processed foods, sodas, red meet and other fattening foods.  - get a least 150 minutes of aerobic exercise per week.

## 2014-10-06 NOTE — Progress Notes (Signed)
Pre visit review using our clinic review tool, if applicable. No additional management support is needed unless otherwise documented below in the visit note. 

## 2014-10-06 NOTE — Addendum Note (Signed)
Addended by: Agnes Lawrence on: 10/06/2014 12:27 PM   Modules accepted: Orders

## 2014-10-13 ENCOUNTER — Ambulatory Visit (INDEPENDENT_AMBULATORY_CARE_PROVIDER_SITE_OTHER): Payer: BLUE CROSS/BLUE SHIELD | Admitting: Ophthalmology

## 2014-10-13 DIAGNOSIS — H43813 Vitreous degeneration, bilateral: Secondary | ICD-10-CM | POA: Diagnosis not present

## 2014-10-13 DIAGNOSIS — I10 Essential (primary) hypertension: Secondary | ICD-10-CM | POA: Diagnosis not present

## 2014-10-13 DIAGNOSIS — H01005 Unspecified blepharitis left lower eyelid: Secondary | ICD-10-CM

## 2014-10-13 DIAGNOSIS — H01004 Unspecified blepharitis left upper eyelid: Secondary | ICD-10-CM

## 2014-10-13 DIAGNOSIS — E11329 Type 2 diabetes mellitus with mild nonproliferative diabetic retinopathy without macular edema: Secondary | ICD-10-CM | POA: Diagnosis not present

## 2014-10-13 DIAGNOSIS — H35033 Hypertensive retinopathy, bilateral: Secondary | ICD-10-CM

## 2014-10-13 DIAGNOSIS — H2513 Age-related nuclear cataract, bilateral: Secondary | ICD-10-CM | POA: Diagnosis not present

## 2014-10-13 DIAGNOSIS — E11319 Type 2 diabetes mellitus with unspecified diabetic retinopathy without macular edema: Secondary | ICD-10-CM | POA: Diagnosis not present

## 2014-10-27 ENCOUNTER — Ambulatory Visit: Payer: Medicare Other | Admitting: Family Medicine

## 2014-10-28 ENCOUNTER — Ambulatory Visit (INDEPENDENT_AMBULATORY_CARE_PROVIDER_SITE_OTHER): Payer: BLUE CROSS/BLUE SHIELD | Admitting: Family Medicine

## 2014-10-28 ENCOUNTER — Encounter: Payer: Self-pay | Admitting: Family Medicine

## 2014-10-28 ENCOUNTER — Ambulatory Visit: Payer: Medicare Other | Admitting: Family Medicine

## 2014-10-28 VITALS — BP 144/70 | HR 73 | Temp 97.6°F | Ht 66.5 in | Wt 190.3 lb

## 2014-10-28 DIAGNOSIS — I10 Essential (primary) hypertension: Secondary | ICD-10-CM

## 2014-10-28 MED ORDER — LISINOPRIL-HYDROCHLOROTHIAZIDE 20-12.5 MG PO TABS: 2.0000 | ORAL_TABLET | Freq: Every day | ORAL | Status: DC

## 2014-10-28 MED ORDER — AMLODIPINE BESYLATE 5 MG PO TABS: 5.0000 mg | ORAL_TABLET | Freq: Every day | ORAL | Status: DC

## 2014-10-28 NOTE — Progress Notes (Signed)
Pre visit review using our clinic review tool, if applicable. No additional management support is needed unless otherwise documented below in the visit note. 

## 2014-10-28 NOTE — Progress Notes (Signed)
HPI:  Follow up:  HTN: -denies hx heat dz, palpitations, LE edema or CHF -prior PCP had him on lasix and BB - he did not want to take lasix due to increased urinary frequency -current meds: lisinopril-hctz 20-12.5 (2 tablets) (added hctz in 09/2014), metoprolol 25 bid -reports: reports BP has been running a little high at home -denies: CP, SOB, DOE, swelling, HA  CRI/HLD/Prediabetes: -reviewed recent labs -conts statin -diet and exercise: he is getting regular exercise and has been working on a healthy diet  ROS: See pertinent positives and negatives per HPI.  Past Medical History  Diagnosis Date  . Hypertension   . Diabetes mellitus without complication   . Hyperlipemia   . BPH (benign prostatic hyperplasia)   . Hemorrhoid   . Colon polyp   . Spinal stenosis     Past Surgical History  Procedure Laterality Date  . Appendectomy  1966  . Colonoscopy  2013    had it close to end of year, exam was normal  . Drain fluid from lungs  1938    states "I was 81 year old"  . Maximum access (mas)posterior lumbar interbody fusion (plif) 1 level N/A 01/21/2014    Procedure: FOR MAXIMUM ACCESS (MAS) POSTERIOR LUMBAR INTERBODY FUSION (PLIF) LUMBAR FOUR FIVE;  Surgeon: Brandon Moore, MD;  Location: [place] [facility];  Service: Neurosurgery;  Laterality: N/A;  . Lumbar wound debridement N/A 02/24/2014    Procedure: Revision of Lumbar Wound;  Surgeon: Brandon Moore, MD;  Location: [facility];  Service: Neurosurgery;  Laterality: N/A;  Revision of Lumbar Wound    Family History  Problem Relation Age of Onset  . Colon cancer Neg Hx   . Heart disease Father     History   Social History  . Marital Status: Married    Spouse Name: N/A  . Number of Children: N/A  . Years of Education: N/A   Social History Main Topics  . Smoking status: Never Smoker   . Smokeless tobacco: Never Used  . Alcohol Use: No     Comment: only drinks at Party/Holidays  . Drug Use: No  . Sexual Activity: Not on  file   Other Topics Concern  . 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  ascorbic acid (VITAMIN C) 500 MG tablet, Take 500 mg by mouth daily. With [employer], Disp: , Rfl:  .  aspirin EC 81 MG tablet, Take 81 mg by mouth daily., Disp: , Rfl:  .  B Complex Vitamins (B COMPLEX 50) TABS, Take 1 tablet by mouth daily. , Disp: , Rfl:  .  Calcium-Vitamin D (CALTRATE 600 PLUS-VIT D PO), Take 1 tablet by mouth daily. , Disp: , Rfl:  .  Cholecalciferol (VITAMIN D-3) 1000 UNITS CAPS, Take 1,000 Units by mouth daily., Disp: , Rfl:  .  glimepiride (AMARYL) 2 MG tablet, Take 0.5 tablets (1 mg total) by mouth daily with breakfast., Disp: 90 tablet, Rfl: 3 .  glucose blood (ONETOUCH VERIO) test strip, Use as instructed to check blood sugar once a day., Disp: 100 each, Rfl: 3 .  lisinopril-hydrochlorothiazide (PRINZIDE,ZESTORETIC) 20-12.5 MG per tablet, Take 2  tablets by mouth daily., Disp: 60 tablet, Rfl: 0 .  metFORMIN (GLUCOPHAGE) 1000 MG tablet, Take 1 tablet (1,000 mg total) by mouth 2 (two) times daily with a meal., Disp: 180 tablet, Rfl: 3 .  metoprolol tartrate (LOPRESSOR) 25 MG tablet, Take 1 tablet (25 mg total) by mouth 2 (two) times daily., Disp: 180 tablet, Rfl: 3 .  Multiple Vitamins-Minerals (CENTRUM PO), Take 1 tablet by mouth daily. , Disp: , Rfl:  .  ONETOUCH DELICA LANCETS 07P MISC, Use as directed to check blood sugar once a day, Disp: 100 each, Rfl: 3 .  simvastatin (ZOCOR) 20 MG tablet, Take 1 tablet (20 mg total) by mouth every evening., Disp: 90 tablet, Rfl: 3 .  vitamin E 400 UNIT capsule, Take 400 Units by mouth daily., Disp: , Rfl:  .  amLODipine (NORVASC) 5 MG tablet, Take 1 tablet (5 mg total) by mouth daily., Disp: 90 tablet, Rfl: 3  EXAM:  Filed  Vitals:   10/28/14 1300  BP: 144/70  Pulse: 73  Temp: 97.6 F (36.4 C)    Body mass index is 30.26 kg/(m^2).  GENERAL: vitals reviewed and listed above, alert, oriented, appears well hydrated and in no acute distress  HEENT: atraumatic, conjunttiva clear, no obvious abnormalities on inspection of external nose and ears  NECK: no obvious masses on inspection  LUNGS: clear to auscultation bilaterally, no wheezes, rales or rhonchi, good air movement  CV: HRRR, no peripheral edema  MS: moves all extremities without noticeable abnormality  PSYCH: pleasant and cooperative, no obvious depression or anxiety  ASSESSMENT AND PLAN:  Discussed the following assessment and plan:  Essential hypertension - Plan: amLODipine (NORVASC) 5 MG tablet  -cont current medications, add norvasc 5mg  - follow up in 2 months, monitor at home and follow up sooner if needed -lifestyle recs discussed -Patient advised to return or notify a doctor immediately if symptoms worsen or persist or new concerns arise.  Patient Instructions  BEFORE YOU LEAVE: -follow up in 2-3 months  Continue current medications  Start the norvasc (amlodipine) 5 mg daily  In 2 weeks, start checking seated blood pressure 2-3 times per week (make sure you sit quietly for 5 minutes and feet are flat on the floor before checking.) keep a blood pressure log and schedule an appointment sooner if BP if running higher the 150/90 on average.     Brandon Santos R.

## 2014-10-28 NOTE — Patient Instructions (Signed)
BEFORE YOU LEAVE: -follow up in 2-3 months  Continue current medications  Start the norvasc (amlodipine) 5 mg daily  In 2 weeks, start checking seated blood pressure 2-3 times per week (make sure you sit quietly for 5 minutes and feet are flat on the floor before checking.) keep a blood pressure log and schedule an appointment sooner if BP if running higher the 150/90 on average.

## 2014-10-30 ENCOUNTER — Other Ambulatory Visit: Payer: Self-pay | Admitting: Family Medicine

## 2014-12-20 ENCOUNTER — Encounter: Payer: Self-pay | Admitting: Internal Medicine

## 2015-01-26 ENCOUNTER — Ambulatory Visit (INDEPENDENT_AMBULATORY_CARE_PROVIDER_SITE_OTHER): Payer: BLUE CROSS/BLUE SHIELD | Admitting: Family Medicine

## 2015-01-26 ENCOUNTER — Encounter: Payer: Self-pay | Admitting: Family Medicine

## 2015-01-26 VITALS — BP 132/80 | HR 76 | Temp 97.7°F | Ht 66.5 in | Wt 186.7 lb

## 2015-01-26 DIAGNOSIS — E785 Hyperlipidemia, unspecified: Secondary | ICD-10-CM | POA: Diagnosis not present

## 2015-01-26 DIAGNOSIS — E119 Type 2 diabetes mellitus without complications: Secondary | ICD-10-CM | POA: Diagnosis not present

## 2015-01-26 DIAGNOSIS — I10 Essential (primary) hypertension: Secondary | ICD-10-CM | POA: Diagnosis not present

## 2015-01-26 NOTE — Progress Notes (Signed)
Pre visit review using our clinic review tool, if applicable. No additional management support is needed unless otherwise documented below in the visit note. 

## 2015-01-26 NOTE — Progress Notes (Signed)
HPI:  HTN: -denies hx heart dz, palpitations, LE edema or CHF -prior PCP had him on lasix and BB - he did not want to take lasix due to increased urinary frequency -current meds: lisinopril-hctz 20-12.5 (2 tablets) (added hctz in 09/2014), metoprolol 25 bid, added norvasc 5 mg 10/2014 -home BPs in the 130s/60-70s 50% of time, 50% in 140s SBP -reports is feeling great, doing 25 minutes of exercise daily -denies: CP, SOB, DOE, swelling, HA  CRI/HLD/Prediabetes: -conts statin -diet and exercise: has increased exercise to 25 minutes daily, watching sugar intake -fasting BS 95-110 -no polyuria, polydipsia or vision changes   ROS: See pertinent positives and negatives per HPI.  Past Medical History  Diagnosis Date  . Hypertension   . Diabetes mellitus without complication   . Hyperlipemia   . BPH (benign prostatic hyperplasia)   . Hemorrhoid   . Colon polyp   . Spinal stenosis     Past Surgical History  Procedure Laterality Date  . Appendectomy  1966  . Colonoscopy  2013    had it close to end of year, exam was normal  . Drain fluid from lungs  1938    states "I was 81 year old"  . Maximum access (mas)posterior lumbar interbody fusion (plif) 1 level N/A 01/21/2014    Procedure: FOR MAXIMUM ACCESS (MAS) POSTERIOR LUMBAR INTERBODY FUSION (PLIF) LUMBAR FOUR FIVE;  Surgeon: Eustace Moore, MD;  Location: [place] [facility];  Service: Neurosurgery;  Laterality: N/A;  . Lumbar wound debridement N/A 02/24/2014    Procedure: Revision of Lumbar Wound;  Surgeon: Eustace Moore, MD;  Location: [facility];  Service: Neurosurgery;  Laterality: N/A;  Revision of Lumbar Wound    Family History  Problem Relation Age of Onset  . Colon cancer Neg Hx   . Heart disease Father     History   Social History  . Marital Status: Married    Spouse Name: N/A  . Number of Children: N/A  . Years of Education: N/A   Social History Main Topics  . Smoking status: Never Smoker   . Smokeless tobacco: Never  Used  . Alcohol Use: No     Comment: only drinks at Party/Holidays  . Drug Use: No  . Sexual Activity: Not on file   Other Topics Concern  . 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  amLODipine (NORVASC) 5 MG tablet, Take 1 tablet (5 mg total) by mouth daily., Disp: 90 tablet, Rfl: 3 .  ascorbic acid (VITAMIN C) 500 MG tablet, Take 500 mg by mouth daily. With [employer], Disp: , Rfl:  .  aspirin EC 81 MG tablet, Take 81 mg by mouth daily., Disp: , Rfl:  .  B Complex Vitamins (B COMPLEX 50) TABS, Take 1 tablet by mouth daily. , Disp: , Rfl:  .  Calcium-Vitamin D (CALTRATE 600 PLUS-VIT D PO), Take 1 tablet by mouth daily. , Disp: , Rfl:  .  Cholecalciferol (VITAMIN D-3) 1000 UNITS CAPS, Take 1,000 Units by mouth daily., Disp: , Rfl:  .  glimepiride (AMARYL) 2 MG tablet, Take 0.5 tablets (1 mg total) by mouth daily with breakfast.,  Disp: 90 tablet, Rfl: 3 .  glucose blood (ONETOUCH VERIO) test strip, Use as instructed to check blood sugar once a day., Disp: 100 each, Rfl: 3 .  lisinopril-hydrochlorothiazide (PRINZIDE,ZESTORETIC) 20-12.5 MG per tablet, Take 2 tablets by mouth daily., Disp: 60 tablet, Rfl: 0 .  metFORMIN (GLUCOPHAGE) 1000 MG tablet, Take 1 tablet (1,000 mg total) by mouth 2 (two) times daily with a meal., Disp: 180 tablet, Rfl: 3 .  metoprolol tartrate (LOPRESSOR) 25 MG tablet, Take 1 tablet (25 mg total) by mouth 2 (two) times daily., Disp: 180 tablet, Rfl: 3 .  metoprolol tartrate (LOPRESSOR) 25 MG tablet, TAKE 1 TABLET TWICE A DAY, Disp: 180 tablet, Rfl: 0 .  Multiple Vitamins-Minerals (CENTRUM PO), Take 1 tablet by mouth daily. , Disp: , Rfl:  .  ONETOUCH DELICA LANCETS 38H MISC, Use as directed to check blood sugar once a day, Disp: 100 each,  Rfl: 3 .  simvastatin (ZOCOR) 20 MG tablet, Take 1 tablet (20 mg total) by mouth every evening., Disp: 90 tablet, Rfl: 3 .  vitamin E 400 UNIT capsule, Take 400 Units by mouth daily., Disp: , Rfl:   EXAM:  Filed Vitals:   01/26/15 0906  BP: 132/80  Pulse: 76  Temp: 97.7 F (36.5 C)    Body mass index is 29.69 kg/(m^2).  GENERAL: vitals reviewed and listed above, alert, oriented, appears well hydrated and in no acute distress  HEENT: atraumatic, conjunttiva clear, no obvious abnormalities on inspection of external nose and ears  NECK: no obvious masses on inspection  LUNGS: clear to auscultation bilaterally, no wheezes, rales or rhonchi, good air movement  CV: HRRR, no peripheral edema  MS: moves all extremities without noticeable abnormality  PSYCH: pleasant and cooperative, no obvious depression or anxiety  ASSESSMENT AND PLAN:  Discussed the following assessment and plan:  Essential hypertension  Hyperlipemia  Type 2 diabetes mellitus without complication  -BP looks good today, opted to cont current regimen and exercise and healthy diet -follow up in 3 months, labs and flu shot then - though advised could get flu shot earlier when available -lifestyle recs -Patient advised to return or notify a doctor immediately if symptoms worsen or persist or new concerns arise.  There are no Patient Instructions on file for this visit.   Brandon Santos R.

## 2015-01-27 ENCOUNTER — Ambulatory Visit: Payer: Medicare Other | Admitting: Family Medicine

## 2015-04-04 ENCOUNTER — Ambulatory Visit (INDEPENDENT_AMBULATORY_CARE_PROVIDER_SITE_OTHER): Payer: BLUE CROSS/BLUE SHIELD

## 2015-04-04 DIAGNOSIS — Z23 Encounter for immunization: Secondary | ICD-10-CM | POA: Diagnosis not present

## 2015-04-28 ENCOUNTER — Encounter: Payer: Self-pay | Admitting: Family Medicine

## 2015-04-28 ENCOUNTER — Ambulatory Visit (INDEPENDENT_AMBULATORY_CARE_PROVIDER_SITE_OTHER): Payer: BLUE CROSS/BLUE SHIELD | Admitting: Family Medicine

## 2015-04-28 VITALS — BP 116/80 | HR 63 | Temp 97.5°F | Ht 66.5 in | Wt 188.9 lb

## 2015-04-28 DIAGNOSIS — E785 Hyperlipidemia, unspecified: Secondary | ICD-10-CM | POA: Diagnosis not present

## 2015-04-28 DIAGNOSIS — I10 Essential (primary) hypertension: Secondary | ICD-10-CM | POA: Diagnosis not present

## 2015-04-28 DIAGNOSIS — E119 Type 2 diabetes mellitus without complications: Secondary | ICD-10-CM

## 2015-04-28 LAB — BASIC METABOLIC PANEL
BUN: 25 mg/dL — AB (ref 6–23)
CO2: 27 mEq/L (ref 19–32)
CREATININE: 1.32 mg/dL (ref 0.40–1.50)
Calcium: 10 mg/dL (ref 8.4–10.5)
Chloride: 105 mEq/L (ref 96–112)
GFR: 55.62 mL/min — AB (ref >60.00)
Glucose, Bld: 92 mg/dL (ref 70–99)
Potassium: 5 mEq/L (ref 3.5–5.1)
Sodium: 139 mEq/L (ref 135–145)

## 2015-04-28 LAB — HEMOGLOBIN A1C: Hgb A1c MFr Bld: 6.3 % (ref 4.6–6.5)

## 2015-04-28 NOTE — Progress Notes (Signed)
HPI:   HTN: -denies hx heart dz, palpitations, LE edema or CHF -prior PCP had him on lasix and BB - he did not want to take lasix due to increased urinary frequency -current meds: lisinopril-hctz 20-12.5 (2 tablets) (added hctz in 09/2014), metoprolol 25 bid, added norvasc 5 mg 10/2014 -reports is feeling great, doing 25 minutes of exercise daily -denies: CP, SOB, DOE, swelling, HA  CRI/HLD/Prediabetes: -conts statin -diet and exercise: has increased exercise to 25 minutes daily, watching sugar intake -fasting BS 95-110 -no polyuria, polydipsia or vision changes   ROS: See pertinent positives and negatives per HPI.  Past Medical History  Diagnosis Date  . Hypertension   . Diabetes mellitus without complication ([place])   . Hyperlipemia   . BPH (benign prostatic hyperplasia)   . Hemorrhoid   . Colon polyp   . Spinal stenosis     Past Surgical History  Procedure Laterality Date  . Appendectomy  1966  . Colonoscopy  2013    had it close to end of year, exam was normal  . Drain fluid from lungs  1938    states "I was 81 year old"  . Maximum access (mas)posterior lumbar interbody fusion (plif) 1 level N/A 01/21/2014    Procedure: FOR MAXIMUM ACCESS (MAS) POSTERIOR LUMBAR INTERBODY FUSION (PLIF) LUMBAR FOUR FIVE;  Surgeon: Eustace Moore, MD;  Location: [facility];  Service: Neurosurgery;  Laterality: N/A;  . Lumbar wound debridement N/A 02/24/2014    Procedure: Revision of Lumbar Wound;  Surgeon: Eustace Moore, MD;  Location: [facility];  Service: Neurosurgery;  Laterality: N/A;  Revision of Lumbar Wound    Family History  Problem Relation Age of Onset  . Colon cancer Neg Hx   . Heart disease Father     Social History   Social History  . Marital Status: Married    Spouse Name: N/A  . Number of Children: N/A  . Years of Education: N/A   Social History Main Topics  . Smoking status: Never Smoker   . Smokeless tobacco: Never Used  . Alcohol Use: No     Comment: only  drinks at Party/Holidays  . Drug Use: No  . Sexual Activity: Not Asked   Other Topics Concern  . 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  amLODipine (NORVASC) 5 MG tablet, Take 1 tablet (5 mg total) by mouth daily., Disp: 90 tablet, Rfl: 3 .  ascorbic acid (VITAMIN C) 500 MG tablet, Take 500 mg by mouth daily. With [employer], Disp: , Rfl:  .  aspirin EC 81 MG tablet, Take 81 mg by mouth daily., Disp: , Rfl:  .  B Complex Vitamins (B COMPLEX 50) TABS, Take 1 tablet by mouth daily. , Disp: , Rfl:  .  Calcium-Vitamin D (CALTRATE 600 PLUS-VIT D PO), Take 1 tablet by mouth daily. , Disp: , Rfl:  .  Cholecalciferol (VITAMIN D-3) 1000 UNITS CAPS, Take 1,000 Units by mouth daily., Disp: , Rfl:  .  glimepiride (AMARYL) 2 MG tablet, Take 0.5 tablets (1 mg total) by mouth daily with breakfast., Disp: 90 tablet, Rfl: 3 .  glucose blood (ONETOUCH  VERIO) test strip, Use as instructed to check blood sugar once a day., Disp: 100 each, Rfl: 3 .  lisinopril-hydrochlorothiazide (PRINZIDE,ZESTORETIC) 20-12.5 MG per tablet, Take 2 tablets by mouth daily., Disp: 60 tablet, Rfl: 0 .  metFORMIN (GLUCOPHAGE) 1000 MG tablet, Take 1 tablet (1,000 mg total) by mouth 2 (two) times daily with a meal., Disp: 180 tablet, Rfl: 3 .  metoprolol tartrate (LOPRESSOR) 25 MG tablet, Take 1 tablet (25 mg total) by mouth 2 (two) times daily., Disp: 180 tablet, Rfl: 3 .  Multiple Vitamins-Minerals (CENTRUM PO), Take 1 tablet by mouth daily. , Disp: , Rfl:  .  ONETOUCH DELICA LANCETS 32P MISC, Use as directed to check blood sugar once a day, Disp: 100 each, Rfl: 3 .  simvastatin (ZOCOR) 20 MG tablet, Take 1 tablet (20 mg total) by mouth every evening., Disp: 90 tablet, Rfl: 3 .  vitamin E 400 UNIT  capsule, Take 400 Units by mouth daily., Disp: , Rfl:   EXAM:  Filed Vitals:   04/28/15 0812  BP: 116/80  Pulse: 63  Temp: 97.5 F (36.4 C)    Body mass index is 30.04 kg/(m^2).  GENERAL: vitals reviewed and listed above, alert, oriented, appears well hydrated and in no acute distress  HEENT: atraumatic, conjunttiva clear, no obvious abnormalities on inspection of external nose and ears  NECK: no obvious masses on inspection  LUNGS: clear to auscultation bilaterally, no wheezes, rales or rhonchi, good air movement  CV: HRRR, no peripheral edema  MS: moves all extremities without noticeable abnormality  PSYCH: pleasant and cooperative, no obvious depression or anxiety  ASSESSMENT AND PLAN:  Discussed the following assessment and plan:  Type 2 diabetes mellitus without complication, without long-term current use of insulin (HCC) - Plan: Hemoglobin A1c  Essential hypertension - Plan: Basic metabolic panel  Hyperlipemia  -doing well, check hgba1c today -lifestyle recs -CPE in April -Patient advised to return or notify a doctor immediately if symptoms worsen or persist or new concerns arise.  There are no Patient Instructions on file for this visit.   Colin Benton R.

## 2015-04-28 NOTE — Progress Notes (Signed)
Pre visit review using our clinic review tool, if applicable. No additional management support is needed unless otherwise documented below in the visit note. 

## 2015-04-29 ENCOUNTER — Telehealth: Payer: Self-pay | Admitting: Family Medicine

## 2015-04-29 NOTE — Telephone Encounter (Signed)
He had a lipid panel in April so recheck was not needed - please see if they can use those values on the form? O/w please check with the lab to see if can be added on. Thanks.

## 2015-04-29 NOTE — Telephone Encounter (Signed)
Patient would like to know if a lipid panel can be done from the labs that was taken yesterday from him.  Patient's wife expressed they need the results for their annual Health Screening form they have to fill out.

## 2015-04-29 NOTE — Telephone Encounter (Signed)
Noted  

## 2015-04-29 NOTE — Telephone Encounter (Signed)
Spoke with patient and he will use his lipid results from April.

## 2015-06-22 ENCOUNTER — Telehealth: Payer: Self-pay | Admitting: Family Medicine

## 2015-06-22 NOTE — Telephone Encounter (Signed)
Pt's wife Collie Siad called saying they dropped off paperwork to be signed by Dr. Maudie Mercury yesterday. If she can fill out the paperwork today, that would be beneficial to the pt because he has transportation today. Please don't fax the paperwork before calling the pt because he needs to sign it. The pt's wife would like a phone call regarding this.  "Collie Siad" pt's wife: [phone] Pt's ph# [phone] Thank you.

## 2015-06-22 NOTE — Telephone Encounter (Signed)
Pt came in to sign form, not to my desk yet.

## 2015-09-28 ENCOUNTER — Telehealth: Payer: Self-pay | Admitting: Family Medicine

## 2015-09-28 DIAGNOSIS — I10 Essential (primary) hypertension: Secondary | ICD-10-CM

## 2015-09-28 NOTE — Telephone Encounter (Signed)
Patient needs amLODipine (NORVASC) 5 MG tablet, lisinopril-hydrochlorothiazide (PRINZIDE,ZESTORETIC) 20-12.5 MG per tablet, and simvastatin (ZOCOR) 20 MG tablet to be sent to  [facility], [place] [phone] (Phone) [phone] (Fax)

## 2015-09-29 MED ORDER — AMLODIPINE BESYLATE 5 MG PO TABS: 5.0000 mg | ORAL_TABLET | Freq: Every day | ORAL | Status: DC

## 2015-09-29 MED ORDER — LISINOPRIL-HYDROCHLOROTHIAZIDE 20-12.5 MG PO TABS: 2.0000 | ORAL_TABLET | Freq: Every day | ORAL | Status: DC

## 2015-09-29 MED ORDER — SIMVASTATIN 20 MG PO TABS: 20.0000 mg | ORAL_TABLET | Freq: Every evening | ORAL | Status: DC

## 2015-09-29 NOTE — Addendum Note (Signed)
Addended by: Agnes Lawrence on: 09/29/2015 02:15 PM   Modules accepted: Orders

## 2015-09-29 NOTE — Telephone Encounter (Signed)
Rx done. 

## 2015-10-19 ENCOUNTER — Ambulatory Visit (INDEPENDENT_AMBULATORY_CARE_PROVIDER_SITE_OTHER): Payer: BLUE CROSS/BLUE SHIELD | Admitting: Ophthalmology

## 2015-10-19 DIAGNOSIS — I10 Essential (primary) hypertension: Secondary | ICD-10-CM | POA: Diagnosis not present

## 2015-10-19 DIAGNOSIS — H35033 Hypertensive retinopathy, bilateral: Secondary | ICD-10-CM

## 2015-10-19 DIAGNOSIS — H35373 Puckering of macula, bilateral: Secondary | ICD-10-CM

## 2015-10-19 DIAGNOSIS — E113293 Type 2 diabetes mellitus with mild nonproliferative diabetic retinopathy without macular edema, bilateral: Secondary | ICD-10-CM | POA: Diagnosis not present

## 2015-10-19 DIAGNOSIS — E11319 Type 2 diabetes mellitus with unspecified diabetic retinopathy without macular edema: Secondary | ICD-10-CM | POA: Diagnosis not present

## 2015-10-19 DIAGNOSIS — H43813 Vitreous degeneration, bilateral: Secondary | ICD-10-CM

## 2015-10-19 LAB — HM DIABETES EYE EXAM

## 2015-10-26 ENCOUNTER — Encounter: Payer: BLUE CROSS/BLUE SHIELD | Admitting: Family Medicine

## 2015-11-03 ENCOUNTER — Encounter: Payer: Self-pay | Admitting: Family Medicine

## 2015-11-03 ENCOUNTER — Ambulatory Visit (INDEPENDENT_AMBULATORY_CARE_PROVIDER_SITE_OTHER): Payer: BLUE CROSS/BLUE SHIELD | Admitting: Family Medicine

## 2015-11-03 VITALS — BP 122/84 | HR 66 | Temp 98.3°F | Ht 66.75 in | Wt 189.0 lb

## 2015-11-03 DIAGNOSIS — E785 Hyperlipidemia, unspecified: Secondary | ICD-10-CM

## 2015-11-03 DIAGNOSIS — Z0000 Encounter for general adult medical examination without abnormal findings: Secondary | ICD-10-CM

## 2015-11-03 DIAGNOSIS — Z0001 Encounter for general adult medical examination with abnormal findings: Secondary | ICD-10-CM | POA: Diagnosis not present

## 2015-11-03 DIAGNOSIS — R899 Unspecified abnormal finding in specimens from other organs, systems and tissues: Secondary | ICD-10-CM

## 2015-11-03 DIAGNOSIS — M25471 Effusion, right ankle: Secondary | ICD-10-CM

## 2015-11-03 DIAGNOSIS — E119 Type 2 diabetes mellitus without complications: Secondary | ICD-10-CM | POA: Diagnosis not present

## 2015-11-03 DIAGNOSIS — I10 Essential (primary) hypertension: Secondary | ICD-10-CM | POA: Diagnosis not present

## 2015-11-03 LAB — HEMOGLOBIN A1C: Hgb A1c MFr Bld: 6.6 % — ABNORMAL HIGH (ref 4.6–6.5)

## 2015-11-03 LAB — CBC
HEMATOCRIT: 43 % (ref 39.0–52.0)
HEMOGLOBIN: 14.5 g/dL (ref 13.0–17.0)
MCHC: 33.6 g/dL (ref 30.0–36.0)
MCV: 89.8 fl (ref 78.0–100.0)
Platelets: 219 10*3/uL (ref 150.0–400.0)
RBC: 4.79 Mil/uL (ref 4.22–5.81)
RDW: 13.4 % (ref 11.5–15.5)
WBC: 12.6 10*3/uL — ABNORMAL HIGH (ref 4.0–10.5)

## 2015-11-03 LAB — BASIC METABOLIC PANEL
BUN: 29 mg/dL — AB (ref 6–23)
CHLORIDE: 104 meq/L (ref 96–112)
CO2: 28 mEq/L (ref 19–32)
Calcium: 9.6 mg/dL (ref 8.4–10.5)
Creatinine, Ser: 1.59 mg/dL — ABNORMAL HIGH (ref 0.40–1.50)
GFR: 44.81 mL/min — ABNORMAL LOW (ref >60.00)
GLUCOSE: 98 mg/dL (ref 70–99)
Potassium: 5.5 mEq/L — ABNORMAL HIGH (ref 3.5–5.1)
Sodium: 138 mEq/L (ref 135–145)

## 2015-11-03 LAB — LIPID PANEL
CHOLESTEROL: 124 mg/dL (ref 0–200)
HDL: 34.8 mg/dL — AB (ref >39.00)
LDL Cholesterol: 62 mg/dL (ref 0–99)
NonHDL: 89.11
TRIGLYCERIDES: 134 mg/dL (ref 0.0–149.0)
Total CHOL/HDL Ratio: 4
VLDL: 26.8 mg/dL (ref 0.0–40.0)

## 2015-11-03 NOTE — Addendum Note (Signed)
Addended by: Agnes Lawrence on: 11/03/2015 01:36 PM   Modules accepted: Orders

## 2015-11-03 NOTE — Progress Notes (Signed)
Pre visit review using our clinic review tool, if applicable. No additional management support is needed unless otherwise documented below in the visit note. 

## 2015-11-03 NOTE — Progress Notes (Addendum)
Medicare Annual Preventive Care Visit  (initial annual wellness or annual wellness exam)  Concerns and/or follow up today:  Swelling R ankle: -for several months -seems to be enlarging -not painful -has seen [facility] in the past  HTN: -denies hx heart dz, palpitations, LE edema or CHF -prior PCP had him on lasix and BB - he did not want to take lasix due to increased urinary frequency -current meds: lisinopril-hctz 20-12.5 (2 tablets) (added hctz in 09/2014), metoprolol 25 bid, added norvasc 5 mg 10/2014 -reports is feeling great, doing 25 minutes of exercise daily -denies: CP, SOB, DOE, swelling, HA  CRI/HLD/Prediabetes: -conts statin -diet and exercise: Doing recumbent bike 30 minutes every day, trying to eat healthy -fasting BS 90s and low 100s -no polyuria, polydipsia or vision changes -Had eye exam with Dr. Zigmund Daniel recently and reports was normal  ROS: negative for report of fevers, unintentional weight loss, vision changes, vision loss, hearing loss or change, chest pain, sob, hemoptysis, melena, hematochezia, hematuria, genital discharge or lesions, falls, bleeding or bruising, loc, thoughts of suicide or self harm, memory loss  1.) Patient-completed health risk assessment  - completed and reviewed, see scanned documentation  2.) Review of Medical History: -PMH, PSH, Family History and current specialty and care providers reviewed and updated and listed below  - see scanned in document in chart and below  Past Medical History  Diagnosis Date  . Hypertension   . Diabetes mellitus without complication ([place])   . Hyperlipemia   . BPH (benign prostatic hyperplasia)   . Hemorrhoid   . Colon polyp   . Spinal stenosis     Past Surgical History  Procedure Laterality Date  . Appendectomy  1966  . Colonoscopy  2013    had it close to end of year, exam was normal  . Drain fluid from lungs  1938    states "I was 82 year old"  . Maximum access (mas)posterior  lumbar interbody fusion (plif) 1 level N/A 01/21/2014    Procedure: FOR MAXIMUM ACCESS (MAS) POSTERIOR LUMBAR INTERBODY FUSION (PLIF) LUMBAR FOUR FIVE;  Surgeon: Eustace Moore, MD;  Location: [facility];  Service: Neurosurgery;  Laterality: N/A;  . Lumbar wound debridement N/A 02/24/2014    Procedure: Revision of Lumbar Wound;  Surgeon: Eustace Moore, MD;  Location: [facility];  Service: Neurosurgery;  Laterality: N/A;  Revision of Lumbar Wound    Social History   Social History  . Marital Status: Married    Spouse Name: N/A  . Number of Children: N/A  . Years of Education: N/A   Occupational History  . Not on file.   Social History Main Topics  . Smoking status: Never Smoker   . Smokeless tobacco: Never Used  . Alcohol Use: No     Comment: only drinks at Party/Holidays  . Drug Use: No  . Sexual Activity: Not on file   Other Topics Concern  . 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 Colon cancer Neg Hx   . Heart disease Father     Current Outpatient Prescriptions on File Prior to  Visit  Medication Sig Dispense Refill  . amLODipine (NORVASC) 5 MG tablet Take 1 tablet (5 mg total) by mouth daily. 90 tablet 1  . ascorbic acid (VITAMIN C) 500 MG tablet Take 500 mg by mouth daily. With [employer]    . aspirin EC 81 MG tablet Take 81 mg by mouth daily.    . B Complex Vitamins (B COMPLEX 50) TABS Take 1 tablet by mouth daily.     . Calcium-Vitamin D (CALTRATE 600 PLUS-VIT D PO) Take 1 tablet by mouth daily.     . Cholecalciferol (VITAMIN D-3) 1000 UNITS CAPS Take 1,000 Units by mouth daily.    Marland Kitchen glimepiride (AMARYL) 2 MG tablet Take 0.5 tablets (1 mg total) by mouth daily with breakfast. 90 tablet 3  . glucose blood (ONETOUCH  VERIO) test strip Use as instructed to check blood sugar once a day. 100 each 3  . lisinopril-hydrochlorothiazide (PRINZIDE,ZESTORETIC) 20-12.5 MG tablet Take 2 tablets by mouth daily. 180 tablet 1  . metFORMIN (GLUCOPHAGE) 1000 MG tablet Take 1 tablet (1,000 mg total) by mouth 2 (two) times daily with a meal. 180 tablet 3  . metoprolol tartrate (LOPRESSOR) 25 MG tablet Take 1 tablet (25 mg total) by mouth 2 (two) times daily. 180 tablet 3  . Multiple Vitamins-Minerals (CENTRUM PO) Take 1 tablet by mouth daily.     Glory Rosebush DELICA LANCETS 99991111 MISC Use as directed to check blood sugar once a day 100 each 3  . simvastatin (ZOCOR) 20 MG tablet Take 1 tablet (20 mg total) by mouth every evening. 90 tablet 1  . vitamin E 400 UNIT capsule Take 400 Units by mouth daily.     No current facility-administered medications on file prior to visit.     3.) Review of functional ability and level of safety:  Any difficulty hearing?  NO  History of falling?  NO  Any trouble with IADLs - using a phone, using transportation, grocery shopping, preparing meals, doing housework, doing laundry, taking medications and managing money? NO  Advance Directives? NO, declined information or assistance with this  See summary of recommendations in Patient Instructions below.  4.) Physical Exam Filed Vitals:   11/03/15 0727  BP: 122/84  Pulse: 66  Temp: 98.3 F (36.8 C)   Estimated body mass index is 29.84 kg/(m^2) as calculated from the following:   Height as of this encounter: 5' 6.75" (1.695 m).   Weight as of this encounter: 189 lb (85.73 kg).  EKG (optional): deferred  General: alert, appear well hydrated and in no acute distress  HEENT: visual acuity grossly intact  CV: HRRR  Lungs: CTA bilaterally  Psych: pleasant and cooperative, no obvious depression or anxiety  Cognitive function grossly intact  MS: Soft mobile subcutaneous nodule located over the achilles tendon on the right ankle,  approximately 1 cm in diameter  See patient instructions for recommendations.  Education and counseling regarding the above review of health provided with a plan for the following: -see scanned patient completed form for further details -fall prevention strategies discussed  -healthy lifestyle discussed -importance and resources for completing advanced directives discussed -see patient instructions below for any other recommendations provided  4)The following written screening schedule of preventive measures were reviewed with assessment and plan made per below, orders and patient instructions:      AAA screening done if applicable     Alcohol screening done     Obesity Screening and counseling done     STI  screening (Hep C if born [birth year]) offered and per pt wishes     Tobacco Screening done done       Pneumococcal (PPSV23 -one dose after 64, one before if risk factors), influenza yearly and hepatitis B vaccines (if high risk - end stage renal disease, IV drugs, homosexual men, live in home for mentally retarded, hemophilia receiving factors) ASSESSMENT/PLAN: do      Prostate cancer screening ASSESSMENT/PLAN: n/a      Colorectal cancer screening (FOBT yearly or flex sig q4y or colonoscopy q10y or barium enema q4y) ASSESSMENT/PLAN: utd       Diabetes outpatient self-management training services ASSESSMENT/PLAN: utd       Screening for glaucoma(q1y if high risk - diabetes, FH, AA and > 69 or hispanic and > 65) ASSESSMENT/PLAN: utd, sees Dr. Rodena Piety      Medical nutritional therapy for individuals with diabetes or renal disease ASSESSMENT/PLAN: see orders      Cardiovascular screening blood tests (lipids q5y) ASSESSMENT/PLAN: see orders and labs      Diabetes screening tests ASSESSMENT/PLAN: see orders and labs   7.) Summary:   Medicare annual wellness visit, subsequent -risk factors and conditions per above assessment were discussed and treatment, recommendations and  referrals were offered per documentation above and orders and patient instructions.  Type 2 diabetes mellitus without complication, without long-term current use of insulin (HCC) - Plan: Hemoglobin A1c -foot exam done -congratulate don exercise -healthy diet discussed  Hyperlipemia - Plan: Lipid Panel -lifestyle recommendations  Essential hypertension - Plan: Basic metabolic panel, CBC (no diff) -labs, lifestyle recs, continue current treatment  R ankle swelling: -query cyst vs other -discussed options for further evaluation and tx - he opted to see his orthopedic office for this - they likely can do [place] in office. Advised he call back if any difficulty scheduling appt or concerns.  Patient Instructions  Before you leave: -Obtain/permission to get I exam and update chart -Please schedule follow-up in about 4 months -Labs  Please call your sports medicine/orthopedic specialist today to set up an appointment to evaluate the lump on your ankle. Please let me know if you have any difficulty getting an appointment.  We recommend the following healthy lifestyle measures: - eat a healthy whole foods diet consisting of regular small meals composed of vegetables, fruits, beans, nuts, seeds, healthy meats such as white chicken and fish and whole grains.  - avoid sweets, white starchy foods, fried foods, fast food, processed foods, sodas, red meet and other fattening foods.  - get a least 150-300 minutes of aerobic exercise per week.    -We have ordered labs or studies at this visit. It can take up to 1-2 weeks for results and processing. We will contact you with instructions IF your results are abnormal. Normal results will be released to your [facility]. If you have not heard from [place] or can not find your results in [facility] in 2 weeks please contact our office.   Please see a lawyer or our nurse and/or go to this website to help you with advanced directives and designating a health care power of  attorney so that your wishes will be followed should you become too ill to make your own medical decisions.  [email or web address]

## 2015-11-03 NOTE — Patient Instructions (Addendum)
Before you leave: -Obtain/permission to get I exam and update chart -Please schedule follow-up in about 4 months -Labs  Please call your sports medicine/orthopedic specialist today to set up an appointment to evaluate the lump on your ankle. Please let me know if you have any difficulty getting an appointment.  We recommend the following healthy lifestyle measures: - eat a healthy whole foods diet consisting of regular small meals composed of vegetables, fruits, beans, nuts, seeds, healthy meats such as white chicken and fish and whole grains.  - avoid sweets, white starchy foods, fried foods, fast food, processed foods, sodas, red meet and other fattening foods.  - get a least 150-300 minutes of aerobic exercise per week.    -We have ordered labs or studies at this visit. It can take up to 1-2 weeks for results and processing. We will contact you with instructions IF your results are abnormal. Normal results will be released to your [facility]. If you have not heard from [place] or can not find your results in [facility] in 2 weeks please contact our office.   Please see a lawyer or our nurse and/or go to this website to help you with advanced directives and designating a health care power of attorney so that your wishes will be followed should you become too ill to make your own medical decisions.  [email or web address]

## 2015-11-07 ENCOUNTER — Encounter: Payer: Self-pay | Admitting: Family Medicine

## 2015-12-01 ENCOUNTER — Other Ambulatory Visit (INDEPENDENT_AMBULATORY_CARE_PROVIDER_SITE_OTHER): Payer: BLUE CROSS/BLUE SHIELD

## 2015-12-01 DIAGNOSIS — R899 Unspecified abnormal finding in specimens from other organs, systems and tissues: Secondary | ICD-10-CM | POA: Diagnosis not present

## 2015-12-01 LAB — BASIC METABOLIC PANEL
BUN: 26 mg/dL — ABNORMAL HIGH (ref 6–23)
CHLORIDE: 104 meq/L (ref 96–112)
CO2: 27 mEq/L (ref 19–32)
Calcium: 9.6 mg/dL (ref 8.4–10.5)
Creatinine, Ser: 1.16 mg/dL (ref 0.40–1.50)
GFR: 64.47 mL/min (ref >60.00)
Glucose, Bld: 99 mg/dL (ref 70–99)
POTASSIUM: 4.9 meq/L (ref 3.5–5.1)
SODIUM: 138 meq/L (ref 135–145)

## 2015-12-01 LAB — CBC WITH DIFFERENTIAL/PLATELET
BASOS PCT: 0.4 % (ref 0.0–3.0)
Basophils Absolute: 0 10*3/uL (ref 0.0–0.1)
EOS PCT: 3.3 % (ref 0.0–5.0)
Eosinophils Absolute: 0.4 10*3/uL (ref 0.0–0.7)
HEMATOCRIT: 44.2 % (ref 39.0–52.0)
HEMOGLOBIN: 14.7 g/dL (ref 13.0–17.0)
Lymphocytes Relative: 28.2 % (ref 12.0–46.0)
Lymphs Abs: 3.4 10*3/uL (ref 0.7–4.0)
MCHC: 33.2 g/dL (ref 30.0–36.0)
MCV: 89.9 fl (ref 78.0–100.0)
MONOS PCT: 6.1 % (ref 3.0–12.0)
Monocytes Absolute: 0.7 10*3/uL (ref 0.1–1.0)
Neutro Abs: 7.4 10*3/uL (ref 1.4–7.7)
Neutrophils Relative %: 62 % (ref 43.0–77.0)
Platelets: 223 10*3/uL (ref 150.0–400.0)
RBC: 4.92 Mil/uL (ref 4.22–5.81)
RDW: 13.5 % (ref 11.5–15.5)
WBC: 12 10*3/uL — AB (ref 4.0–10.5)

## 2015-12-22 ENCOUNTER — Telehealth: Payer: Self-pay | Admitting: Family Medicine

## 2015-12-22 MED ORDER — METFORMIN HCL 1000 MG PO TABS: 1000.0000 mg | ORAL_TABLET | Freq: Two times a day (BID) | ORAL | Status: DC

## 2015-12-22 MED ORDER — GLIMEPIRIDE 2 MG PO TABS: 1.0000 mg | ORAL_TABLET | Freq: Every day | ORAL | Status: DC

## 2015-12-22 MED ORDER — METOPROLOL TARTRATE 25 MG PO TABS: 25.0000 mg | ORAL_TABLET | Freq: Two times a day (BID) | ORAL | Status: DC

## 2015-12-22 NOTE — Telephone Encounter (Signed)
Rx done. 

## 2015-12-22 NOTE — Telephone Encounter (Signed)
The patient needs these three Rx; glimepiride (AMARYL) 2 MG tablet, metFORMIN (GLUCOPHAGE) 1000 MG tablet, and metoprolol tartrate (LOPRESSOR) 25 MG tablet sent to: [facility], [place] [phone] (Phone) [phone] (Fax)      He would like to have them sent for a 90 day refills, so they just have to pay one co-pay

## 2016-04-04 ENCOUNTER — Other Ambulatory Visit: Payer: Self-pay | Admitting: Family Medicine

## 2016-04-04 DIAGNOSIS — I10 Essential (primary) hypertension: Secondary | ICD-10-CM

## 2016-05-09 NOTE — Progress Notes (Signed)
HPI:  Brandon Santos is a pleasant 82 year old with past medical history significant for diabetes with chronic renal insufficiency, hypertension, hyperlipidemia and a BMI of 29 here for follow-up. He reports he is doing quite well. He reports regular daily exercise and a healthy diet. Denies any concerns with his medications. Denies chest pain, shortness of breath, increased swelling, hypoglycemia. Due for flu shot and labs.  HTN: -denies hx heart dz, palpitations, significant or worsening LE edema or CHF -prior PCP had him on lasix and BB - he did not want to take lasix due to increased urinary frequency -current meds: lisinopril-hctz 20-12.5 (2 tablets), metoprolol 25 bid, norvasc 5 mg   CRI/HLD/Prediabetes: -conts statin -fasting BS 90s and low 100s -no polyuria, polydipsia or vision changes -eye exams with Dr. Zigmund Daniel   ROS: See pertinent positives and negatives per HPI.  Past Medical History:  Diagnosis Date  . BPH (benign prostatic hyperplasia)   . Colon polyp   . Diabetes mellitus without complication ([place])   . Hemorrhoid   . Hyperlipemia   . Hypertension   . Spinal stenosis     Past Surgical History:  Procedure Laterality Date  . APPENDECTOMY  1966  . COLONOSCOPY  2013   had it close to end of year, exam was normal  . Drain fluid from lungs  1938   states "I was 82 year old"  . LUMBAR WOUND DEBRIDEMENT N/A 02/24/2014   Procedure: Revision of Lumbar Wound;  Surgeon: Eustace Moore, MD;  Location: [place] [facility];  Service: Neurosurgery;  Laterality: N/A;  Revision of Lumbar Wound  . MAXIMUM ACCESS (MAS)POSTERIOR LUMBAR INTERBODY FUSION (PLIF) 1 LEVEL N/A 01/21/2014   Procedure: FOR MAXIMUM ACCESS (MAS) POSTERIOR LUMBAR INTERBODY FUSION (PLIF) LUMBAR FOUR FIVE;  Surgeon: Eustace Moore, MD;  Location: [place] [facility];  Service: Neurosurgery;  Laterality: N/A;    Family History  Problem Relation Age of Onset  . Colon cancer Neg Hx   . Heart disease Father     Social History    Social History  . Marital status: Married    Spouse name: N/A  . Number of children: N/A  . Years of education: N/A   Social History Main Topics  . Smoking status: Never Smoker  . Smokeless tobacco: Never Used  . Alcohol use No     Comment: only drinks at Party/Holidays  . Drug use: No  . Sexual activity: Not Asked   Other Topics Concern  . 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  amLODipine (NORVASC) 5 MG tablet, TAKE 1 TABLET (5 MG TOTAL) BY MOUTH DAILY., Disp: 90 tablet, Rfl: 0 .  aspirin EC 81 MG tablet, Take 81 mg by mouth daily., Disp: , Rfl:  .  glimepiride (AMARYL) 2 MG tablet, Take 0.5 tablets (1 mg total) by mouth daily with breakfast., Disp: 90 tablet, Rfl: 1 .  glucose blood (ONETOUCH VERIO) test strip, Use as instructed to check blood sugar once a day., Disp: 100 each, Rfl: 3 .  LATANOPROST OP, Apply 1 drop to eye. Each eye every morning, Disp: , Rfl:  .  lisinopril-hydrochlorothiazide (PRINZIDE,ZESTORETIC) 20-12.5 MG tablet, TAKE 2  TABLETS BY MOUTH DAILY., Disp: 180 tablet, Rfl: 0 .  metFORMIN (GLUCOPHAGE) 1000 MG tablet, Take 1 tablet (1,000 mg total) by mouth 2 (two) times daily with a meal., Disp: 180 tablet, Rfl: 1 .  metoprolol tartrate (LOPRESSOR) 25 MG tablet, Take 1 tablet (25 mg total) by mouth 2 (two) times daily., Disp: 180 tablet, Rfl: 1 .  ONETOUCH DELICA LANCETS 99991111 MISC, Use as directed to check blood sugar once a day, Disp: 100 each, Rfl: 3 .  simvastatin (ZOCOR) 20 MG tablet, TAKE 1 TABLET (20 MG TOTAL) BY MOUTH EVERY EVENING., Disp: 90 tablet, Rfl: 0 .  timolol (TIMOPTIC) 0.5 % ophthalmic solution, Place 1 drop into both eyes daily. , Disp: , Rfl:  .  ascorbic acid (VITAMIN C) 500 MG tablet, Take 500 mg by mouth  daily. With [employer], Disp: , Rfl:  .  B Complex Vitamins (B COMPLEX 50) TABS, Take 1 tablet by mouth daily. , Disp: , Rfl:  .  Calcium-Vitamin D (CALTRATE 600 PLUS-VIT D PO), Take 1 tablet by mouth daily. , Disp: , Rfl:  .  Cholecalciferol (VITAMIN D-3) 1000 UNITS CAPS, Take 1,000 Units by mouth daily., Disp: , Rfl:  .  Multiple Vitamins-Minerals (CENTRUM PO), Take 1 tablet by mouth daily. , Disp: , Rfl:  .  vitamin E 400 UNIT capsule, Take 400 Units by mouth daily., Disp: , Rfl:   EXAM:  Vitals:   05/10/16 0934  BP: 124/70  Pulse: 65  Temp: 97.7 F (36.5 C)    Body mass index is 29.35 kg/m.  GENERAL: vitals reviewed and listed above, alert, oriented, appears well hydrated and in no acute distress  HEENT: atraumatic, conjunttiva clear, no obvious abnormalities on inspection of external nose and ears  NECK: no obvious masses on inspection  LUNGS: clear to auscultation bilaterally, no wheezes, rales or rhonchi, good air movement  CV: HRRR, Trace bilateral lower extremity peripheral edema  MS: moves all extremities without noticeable abnormality  PSYCH: pleasant and cooperative, no obvious depression or anxiety  ASSESSMENT AND PLAN:  Discussed the following assessment and plan:  Type 2 diabetes mellitus without complication, without long-term current use of insulin (HCC) - Plan: Hemoglobin A1c  Hyperlipidemia, unspecified hyperlipidemia type  Essential hypertension - Plan: Basic metabolic panel, CBC (no diff)  BMI 29.0-29.9,adult  -Labs today -Blood pressure much better on recheck and will continue lifestyle changes and current medications -Flu shot -Follow up 3 months -Patient advised to return or notify a doctor immediately if symptoms worsen or persist or new concerns arise.  Patient Instructions  BEFORE YOU LEAVE: -flu shot -labs -follow up: 3 months  We have ordered labs or studies at this visit. It can take up to 1-2 weeks for results and processing. IF  results require follow up or explanation, we will call you with instructions. Clinically stable results will be released to your [facility]. If you have not heard from [place] or cannot find your results in [facility] in 2 weeks please contact our office at [phone].  If you are not yet signed up for [facility], please consider signing up.  If you are not yet signed up for [facility], please SIGN UP TODAY. We now offer online scheduling, same day appointments and extended hours. WHEN YOU DON'T FEEL YOUR BEST.Marland KitchenMarland KitchenWE ARE HERE TO HELP.   We recommend the following healthy lifestyle for LIFE: 1) Small portions.   Tip: eat off of a salad plate instead of a dinner plate.  Tip: if you need  more or a snack choose fruits, veggies and/or a handful of nuts or seeds.  2) Eat a healthy clean diet.  * Tip: Avoid (less then 1 serving per week): processed foods, sweets, sweetened drinks, white starches (rice, flour, bread, potatoes, pasta, etc), red meat, fast foods, butter  *Tip: CHOOSE instead   * 5-9 servings per day of fresh or frozen fruits and vegetables (but not corn, potatoes, bananas, canned or dried fruit)   *nuts and seeds, beans   *olives and olive oil   *small portions of lean meats such as fish and white chicken    *small portions of whole grains  3)Get at least 150 minutes of sweaty aerobic exercise per week.  4)Reduce stress - consider counseling, meditation and relaxation to balance other aspects of your life.          Colin Benton R., DO

## 2016-05-10 ENCOUNTER — Encounter: Payer: Self-pay | Admitting: Family Medicine

## 2016-05-10 ENCOUNTER — Ambulatory Visit (INDEPENDENT_AMBULATORY_CARE_PROVIDER_SITE_OTHER): Payer: BLUE CROSS/BLUE SHIELD | Admitting: Family Medicine

## 2016-05-10 VITALS — BP 124/70 | HR 65 | Temp 97.7°F | Ht 66.75 in | Wt 186.0 lb

## 2016-05-10 DIAGNOSIS — I10 Essential (primary) hypertension: Secondary | ICD-10-CM | POA: Diagnosis not present

## 2016-05-10 DIAGNOSIS — Z23 Encounter for immunization: Secondary | ICD-10-CM

## 2016-05-10 DIAGNOSIS — E119 Type 2 diabetes mellitus without complications: Secondary | ICD-10-CM | POA: Diagnosis not present

## 2016-05-10 DIAGNOSIS — E785 Hyperlipidemia, unspecified: Secondary | ICD-10-CM

## 2016-05-10 DIAGNOSIS — Z6829 Body mass index (BMI) 29.0-29.9, adult: Secondary | ICD-10-CM | POA: Diagnosis not present

## 2016-05-10 LAB — CBC
HEMATOCRIT: 42.3 % (ref 39.0–52.0)
Hemoglobin: 14.1 g/dL (ref 13.0–17.0)
MCHC: 33.3 g/dL (ref 30.0–36.0)
MCV: 89.3 fl (ref 78.0–100.0)
Platelets: 227 10*3/uL (ref 150.0–400.0)
RBC: 4.74 Mil/uL (ref 4.22–5.81)
RDW: 13.7 % (ref 11.5–15.5)
WBC: 12.3 10*3/uL — ABNORMAL HIGH (ref 4.0–10.5)

## 2016-05-10 LAB — BASIC METABOLIC PANEL
BUN: 32 mg/dL — ABNORMAL HIGH (ref 6–23)
CHLORIDE: 106 meq/L (ref 96–112)
CO2: 27 meq/L (ref 19–32)
CREATININE: 1.38 mg/dL (ref 0.40–1.50)
Calcium: 9.5 mg/dL (ref 8.4–10.5)
GFR: 52.7 mL/min — ABNORMAL LOW (ref >60.00)
GLUCOSE: 103 mg/dL — AB (ref 70–99)
Potassium: 5.3 mEq/L — ABNORMAL HIGH (ref 3.5–5.1)
Sodium: 140 mEq/L (ref 135–145)

## 2016-05-10 LAB — HEMOGLOBIN A1C: HEMOGLOBIN A1C: 6.5 % (ref 4.6–6.5)

## 2016-05-10 NOTE — Progress Notes (Signed)
Pre visit review using our clinic review tool, if applicable. No additional management support is needed unless otherwise documented below in the visit note. 

## 2016-05-10 NOTE — Patient Instructions (Signed)
BEFORE YOU LEAVE: -flu shot -labs -follow up: 3 months  We have ordered labs or studies at this visit. It can take up to 1-2 weeks for results and processing. IF results require follow up or explanation, we will call you with instructions. Clinically stable results will be released to your [facility]. If you have not heard from [place] or cannot find your results in [facility] in 2 weeks please contact our office at [phone].  If you are not yet signed up for [facility], please consider signing up.  If you are not yet signed up for [facility], please SIGN UP TODAY. We now offer online scheduling, same day appointments and extended hours. WHEN YOU DON'T FEEL YOUR BEST.Marland KitchenMarland KitchenWE ARE HERE TO HELP.   We recommend the following healthy lifestyle for LIFE: 1) Small portions.   Tip: eat off of a salad plate instead of a dinner plate.  Tip: if you need more or a snack choose fruits, veggies and/or a handful of nuts or seeds.  2) Eat a healthy clean diet.  * Tip: Avoid (less then 1 serving per week): processed foods, sweets, sweetened drinks, white starches (rice, flour, bread, potatoes, pasta, etc), red meat, fast foods, butter  *Tip: CHOOSE instead   * 5-9 servings per day of fresh or frozen fruits and vegetables (but not corn, potatoes, bananas, canned or dried fruit)   *nuts and seeds, beans   *olives and olive oil   *small portions of lean meats such as fish and white chicken    *small portions of whole grains  3)Get at least 150 minutes of sweaty aerobic exercise per week.  4)Reduce stress - consider counseling, meditation and relaxation to balance other aspects of your life.

## 2016-05-28 ENCOUNTER — Telehealth: Payer: Self-pay | Admitting: *Deleted

## 2016-05-28 NOTE — Telephone Encounter (Signed)
Dr Maudie Mercury received the handicapped placard form that the pts wife dropped off and stated he needs an appt.  I called the pt and left a detailed message with this information and asked that he call back for an appt.

## 2016-05-31 ENCOUNTER — Ambulatory Visit (INDEPENDENT_AMBULATORY_CARE_PROVIDER_SITE_OTHER): Payer: BLUE CROSS/BLUE SHIELD | Admitting: Family Medicine

## 2016-05-31 ENCOUNTER — Encounter: Payer: Self-pay | Admitting: Family Medicine

## 2016-05-31 VITALS — BP 126/80 | HR 72 | Temp 97.4°F | Ht 66.75 in | Wt 188.2 lb

## 2016-05-31 DIAGNOSIS — M25552 Pain in left hip: Secondary | ICD-10-CM | POA: Diagnosis not present

## 2016-05-31 DIAGNOSIS — M25551 Pain in right hip: Secondary | ICD-10-CM | POA: Diagnosis not present

## 2016-05-31 NOTE — Progress Notes (Signed)
Pre visit review using our clinic review tool, if applicable. No additional management support is needed unless otherwise documented below in the visit note. 

## 2016-05-31 NOTE — Patient Instructions (Signed)
BEFORE YOU LEAVE: -form -IT band exercises -follow up: 3 months if not already schedule for follow up  Do the exercises at least 4 days per week.  Please let us know if you change your mind about doing an xray to look at your hips.  Continue to exercise on the bike.

## 2016-05-31 NOTE — Progress Notes (Signed)
HPI:  Brandon Santos is a pleasant 82 yo here for an acute visit for bilateral hip pain. He reports this has been ongoing for greater than 10 years and is unchanged. The pain is in the bilateral lateral hips and only occurs with walking, and is better with rest. He is here today because he wants a renewal on his handicap parking decal. He reports that he gets pain and needs to rest after about 100 feet of walking. Oddly, he is able to do regular exercises daily on his bicycle and this does not cause pain. Denies weakness, numbness, bowel or bladder incontinence, falls or fever following, radiation of pain, worsening of pain or any other concerns. He does not want any further evaluation of this as he reports it has been going on for ever, all he wants is his handicap decal.   ROS: See pertinent positives and negatives per HPI.  Past Medical History:  Diagnosis Date  . BPH (benign prostatic hyperplasia)   . Colon polyp   . Diabetes mellitus without complication ([place])   . Hemorrhoid   . Hyperlipemia   . Hypertension   . Spinal stenosis     Past Surgical History:  Procedure Laterality Date  . APPENDECTOMY  1966  . COLONOSCOPY  2013   had it close to end of year, exam was normal  . Drain fluid from lungs  1938   states "I was 82 year old"  . LUMBAR WOUND DEBRIDEMENT N/A 02/24/2014   Procedure: Revision of Lumbar Wound;  Surgeon: Eustace Moore, MD;  Location: [place] [facility];  Service: Neurosurgery;  Laterality: N/A;  Revision of Lumbar Wound  . MAXIMUM ACCESS (MAS)POSTERIOR LUMBAR INTERBODY FUSION (PLIF) 1 LEVEL N/A 01/21/2014   Procedure: FOR MAXIMUM ACCESS (MAS) POSTERIOR LUMBAR INTERBODY FUSION (PLIF) LUMBAR FOUR FIVE;  Surgeon: Eustace Moore, MD;  Location: [place] [facility];  Service: Neurosurgery;  Laterality: N/A;    Family History  Problem Relation Age of Onset  . Colon cancer Neg Hx   . Heart disease Father     Social History   Social History  . Marital status: Married    Spouse  name: N/A  . Number of children: N/A  . Years of education: N/A   Social History Main Topics  . Smoking status: Never Smoker  . Smokeless tobacco: Never Used  . Alcohol use No     Comment: only drinks at Party/Holidays  . Drug use: No  . Sexual activity: Not Asked   Other Topics Concern  . 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  amLODipine (NORVASC) 5 MG tablet, TAKE 1 TABLET (5 MG TOTAL) BY MOUTH DAILY., Disp: 90 tablet, Rfl: 0 .  ascorbic acid (VITAMIN C) 500 MG tablet, Take 500 mg by mouth daily. With [employer], Disp: , Rfl:  .  aspirin EC 81 MG tablet, Take 81 mg by mouth daily., Disp: , Rfl:  .  B Complex Vitamins (B COMPLEX 50) TABS, Take 1 tablet by mouth daily. , Disp: , Rfl:  .  Calcium-Vitamin D (CALTRATE 600 PLUS-VIT D PO), Take 1 tablet by mouth daily. , Disp: , Rfl:  .  Cholecalciferol (VITAMIN D-3) 1000 UNITS CAPS, Take 1,000 Units by mouth daily., Disp: , Rfl:  .  glimepiride (AMARYL) 2 MG tablet, Take 0.5 tablets (1 mg total) by mouth daily with breakfast., Disp: 90 tablet, Rfl: 1 .  glucose blood (ONETOUCH VERIO) test strip, Use as instructed to check blood sugar once a day., Disp: 100 each, Rfl: 3 .  LATANOPROST OP, Apply 1 drop to eye. Each eye every morning, Disp: , Rfl:  .  lisinopril-hydrochlorothiazide (PRINZIDE,ZESTORETIC) 20-12.5 MG tablet, TAKE 2 TABLETS BY MOUTH DAILY., Disp: 180 tablet, Rfl: 0 .  metFORMIN (GLUCOPHAGE) 1000 MG tablet, Take 1 tablet (1,000 mg total) by mouth 2 (two) times daily with a meal., Disp: 180 tablet, Rfl: 1 .  metoprolol tartrate (LOPRESSOR) 25 MG tablet, Take 1 tablet (25 mg total) by mouth 2 (two) times daily., Disp: 180 tablet, Rfl: 1 .  Multiple Vitamins-Minerals (CENTRUM PO), Take 1 tablet by  mouth daily. , Disp: , Rfl:  .  ONETOUCH DELICA LANCETS 99991111 MISC, Use as directed to check blood sugar once a day, Disp: 100 each, Rfl: 3 .  simvastatin (ZOCOR) 20 MG tablet, TAKE 1 TABLET (20 MG TOTAL) BY MOUTH EVERY EVENING., Disp: 90 tablet, Rfl: 0 .  timolol (TIMOPTIC) 0.5 % ophthalmic solution, Place 1 drop into both eyes daily. , Disp: , Rfl:  .  vitamin E 400 UNIT capsule, Take 400 Units by mouth daily., Disp: , Rfl:   EXAM:  Vitals:   05/31/16 0941  BP: 126/80  Pulse: 72  Temp: 97.4 F (36.3 C)    Body mass index is 29.7 kg/m.  GENERAL: vitals reviewed and listed above, alert, oriented, appears well hydrated and in no acute distress  HEENT: atraumatic, conjunttiva clear, no obvious abnormalities on inspection of external nose and ears  NECK: no obvious masses on inspection  LUNGS: clear to auscultation bilaterally, no wheezes, rales or rhonchi, good air movement  CV: HRRR, no peripheral edema  MS: moves all extremities without noticeable abnormality, normal gait, no appreciable significant deformities, scoliosis or leg length discrepancy, no tenderness to palpation in the hip socket or the lateral hip - he points to the IT band as the area of pain, no pain currently, he does have a tight IT band and hamstrings bilaterally, normal range of motion of the hip with negative compression test  PSYCH: pleasant and cooperative, no obvious depression or anxiety  ASSESSMENT AND PLAN:  Discussed the following assessment and plan:  Bilateral hip pain  -Suspect tight IT band, did advise x-ray of one hip to evaluate for OA given he has pain with weightbearing but not with other activities he refused, he is only here to get a renewal on his handicap parking decal which he says makes life much more bearable when he needs to go shopping -Did try to encourage him to do a home exercise program to try to help, and to pursue further evaluation if issues persist rather than committing to a  life without comfortable walking  -Declines any issues with gait or need for any type of walking device  -Forms completed and given to assistant to copy and returned patient  -Patient advised to return or notify a doctor immediately if symptoms worsen or persist or new concerns arise.  Patient Instructions  BEFORE YOU LEAVE: -form -IT band exercises -follow up: 3 months if not already schedule for follow up  Do the exercises at least 4 days per week.  Please let us know if you  change your mind about doing an xray to look at your hips.  Continue to exercise on the bike.    Colin Benton R., DO

## 2016-07-12 ENCOUNTER — Other Ambulatory Visit: Payer: Self-pay | Admitting: Family Medicine

## 2016-07-12 DIAGNOSIS — I10 Essential (primary) hypertension: Secondary | ICD-10-CM

## 2016-10-04 ENCOUNTER — Other Ambulatory Visit: Payer: Self-pay | Admitting: Family Medicine

## 2016-10-04 DIAGNOSIS — I10 Essential (primary) hypertension: Secondary | ICD-10-CM

## 2016-10-18 ENCOUNTER — Ambulatory Visit (INDEPENDENT_AMBULATORY_CARE_PROVIDER_SITE_OTHER): Payer: BLUE CROSS/BLUE SHIELD | Admitting: Ophthalmology

## 2016-10-18 DIAGNOSIS — E11319 Type 2 diabetes mellitus with unspecified diabetic retinopathy without macular edema: Secondary | ICD-10-CM

## 2016-10-18 DIAGNOSIS — H2513 Age-related nuclear cataract, bilateral: Secondary | ICD-10-CM

## 2016-10-18 DIAGNOSIS — I10 Essential (primary) hypertension: Secondary | ICD-10-CM

## 2016-10-18 DIAGNOSIS — H353131 Nonexudative age-related macular degeneration, bilateral, early dry stage: Secondary | ICD-10-CM

## 2016-10-18 DIAGNOSIS — H35371 Puckering of macula, right eye: Secondary | ICD-10-CM | POA: Diagnosis not present

## 2016-10-18 DIAGNOSIS — E113293 Type 2 diabetes mellitus with mild nonproliferative diabetic retinopathy without macular edema, bilateral: Secondary | ICD-10-CM | POA: Diagnosis not present

## 2016-10-18 DIAGNOSIS — H35033 Hypertensive retinopathy, bilateral: Secondary | ICD-10-CM

## 2016-10-18 DIAGNOSIS — H43813 Vitreous degeneration, bilateral: Secondary | ICD-10-CM

## 2016-10-18 LAB — HM DIABETES EYE EXAM

## 2016-11-21 NOTE — Progress Notes (Signed)
HPI:  Here for CPE: DUe for labs, foot exam, eye exam Reports doing well. Now riding his bike for 30 minutes each morning and "feels great". FBS 90-110. No CP, falls, SOB, DOE, low blood sugars. Reports eye exam done last month and has follow up in 1 month for "fluid in the eye."   -Concerns and/or follow up today:   HTN: -denies hx heart dz, palpitations, significant or worsening LE edema or CHF -current meds: lisinopril-hctz 20-12.5 (2 tablets), metoprolol 25 bid, norvasc 5 mg   CRI/HLD/Borderline diabetes: -conts statin -meds: asa, amaryl 1mg  daily, metfromin 1000mg  bid -fasting BS 90s and low 100s -eye exams with Dr. Zigmund Daniel    -Diabetes and Dyslipidemia Screening:fasting for labs  -Hx of HTN: no  -Vaccines: UTD  -sexual activity: yes, male partner, no new partners  -wants STI testing, Hep C screening (if born 03-1962): no  -FH colon or prstate ca: see FH Last colon cancer screening: done, reports told complete given age Last prostate ca screening: complete given age and declined  -Alcohol, Tobacco, drug use: see social history  Review of Systems - no fevers, unintentional weight loss, vision loss, hearing loss, chest pain, sob, hemoptysis, melena, hematochezia, hematuria, genital discharge, changing or concerning skin lesions, bleeding, bruising, loc, thoughts of self harm or SI  Past Medical History:  Diagnosis Date  . BPH (benign prostatic hyperplasia)   . Colon polyp   . Diabetes mellitus without complication ([place])   . Hemorrhoid   . Hyperlipemia   . Hypertension   . Spinal stenosis     Past Surgical History:  Procedure Laterality Date  . APPENDECTOMY  1966  . COLONOSCOPY  2013   had it close to end of year, exam was normal  . Drain fluid from lungs  1938   states "I was 83 year old"  . LUMBAR WOUND DEBRIDEMENT N/A 02/24/2014   Procedure: Revision of Lumbar Wound;  Surgeon: Eustace Moore, MD;  Location: [facility];  Service: Neurosurgery;   Laterality: N/A;  Revision of Lumbar Wound  . MAXIMUM ACCESS (MAS)POSTERIOR LUMBAR INTERBODY FUSION (PLIF) 1 LEVEL N/A 01/21/2014   Procedure: FOR MAXIMUM ACCESS (MAS) POSTERIOR LUMBAR INTERBODY FUSION (PLIF) LUMBAR FOUR FIVE;  Surgeon: Eustace Moore, MD;  Location: [facility];  Service: Neurosurgery;  Laterality: N/A;    Family History  Problem Relation Age of Onset  . Colon cancer Neg Hx   . Heart disease Father     Social History   Social History  . Marital status: Married    Spouse name: N/A  . Number of children: N/A  . Years of education: N/A   Social History Main Topics  . Smoking status: Never Smoker  . Smokeless tobacco: Never Used  . Alcohol use No     Comment: only drinks at Party/Holidays  . Drug use: No  . Sexual activity: Not Asked   Other Topics Concern  . 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  amLODipine (NORVASC) 5 MG tablet, TAKE 1 TABLET (5 MG TOTAL) BY MOUTH DAILY., Disp: 90 tablet, Rfl: 1 .  ascorbic acid (VITAMIN C) 500 MG tablet, Take 500  mg by mouth daily. With [employer], Disp: , Rfl:  .  aspirin EC 81 MG tablet, Take 81 mg by mouth daily., Disp: , Rfl:  .  B Complex Vitamins (B COMPLEX 50) TABS, Take 1 tablet by mouth daily. , Disp: , Rfl:  .  Calcium-Vitamin D (CALTRATE 600 PLUS-VIT D PO), Take 1 tablet by mouth daily. , Disp: , Rfl:  .  Cholecalciferol (VITAMIN D-3) 1000 UNITS CAPS, Take 1,000 Units by mouth daily., Disp: , Rfl:  .  glimepiride (AMARYL) 2 MG tablet, Take 0.5 tablets (1 mg total) by mouth daily with breakfast., Disp: 90 tablet, Rfl: 1 .  glucose blood (ONETOUCH VERIO) test strip, Use as instructed to check blood sugar once a day., Disp: 100 each, Rfl: 3 .  LATANOPROST OP, Apply 1 drop to eye. Each  eye every morning, Disp: , Rfl:  .  lisinopril-hydrochlorothiazide (PRINZIDE,ZESTORETIC) 20-12.5 MG tablet, TAKE 2 TABLETS BY MOUTH DAILY., Disp: 180 tablet, Rfl: 1 .  metFORMIN (GLUCOPHAGE) 1000 MG tablet, TAKE ONE TABLET BY MOUTH TWICE A DAY WITH FOOD, Disp: 180 tablet, Rfl: 1 .  metoprolol tartrate (LOPRESSOR) 25 MG tablet, TAKE ONE TABLET BY MOUTH TWICE A DAY, Disp: 180 tablet, Rfl: 1 .  Multiple Vitamins-Minerals (CENTRUM PO), Take 1 tablet by mouth daily. , Disp: , Rfl:  .  ONETOUCH DELICA LANCETS 16X MISC, Use as directed to check blood sugar once a day, Disp: 100 each, Rfl: 3 .  simvastatin (ZOCOR) 20 MG tablet, TAKE 1 TABLET (20 MG TOTAL) BY MOUTH EVERY EVENING., Disp: 90 tablet, Rfl: 1 .  timolol (TIMOPTIC) 0.5 % ophthalmic solution, Place 1 drop into both eyes daily. , Disp: , Rfl:  .  vitamin E 400 UNIT capsule, Take 400 Units by mouth daily., Disp: , Rfl:   EXAM:  Vitals:   11/22/16 1105  BP: 138/80  Pulse: 64  Temp: 97.5 F (36.4 C)  TempSrc: Oral  Weight: 191 lb 8 oz (86.9 kg)  Height: 5' 6.5" (1.689 m)    Estimated body mass index is 30.45 kg/m as calculated from the following:   Height as of this encounter: 5' 6.5" (1.689 m).   Weight as of this encounter: 191 lb 8 oz (86.9 kg).  GENERAL: vitals reviewed and listed below, alert, oriented, appears well hydrated and in no acute distress  HEENT: head atraumatic, PERRLA, normal appearance of eyes, ears, nose and mouth. moist mucus membranes.  NECK: supple, no masses or lymphadenopathy  LUNGS: clear to auscultation bilaterally, no rales, rhonchi or wheeze  CV: HRRR, no peripheral edema or cyanosis, normal pedal pulses  ABDOMEN: bowel sounds normal, soft, non tender to palpation, no masses, no rebound or guarding  GU: declined  RECTAL: refused  SKIN: no rash or abnormal lesions - declined full skin exam  MS: normal gait, moves all extremities normally  NEURO: normal gait, speech and thought processing  grossly intact, muscle tone grossly intact throughout  PSYCH: normal affect, pleasant and cooperative  FEET: see foot exam section of chart - normal  ASSESSMENT AND PLAN:  Discussed the following assessment and plan:  Encounter for preventive health examination  Type 2 diabetes mellitus without complication, without long-term current use of insulin (HCC) - Plan: Hemoglobin A1c  Hyperlipidemia, unspecified hyperlipidemia type - Plan: Lipid panel  Essential hypertension - Plan: Basic metabolic panel, CBC  BMI 09.6-04.5,WUJWJ  -Discussed and advised all [place] preventive services health task force level A and B recommendations for age, sex and risks.  --  Advised at least 150 minutes of exercise per week and a healthy diet with avoidance of (less then 1 serving per week) processed foods, white starches, red meat, fast foods and sweets and consisting of: * 5-9 servings of fresh fruits and vegetables (not corn or potatoes) *nuts and seeds, beans *olives and olive oil *lean meats such as fish and white chicken  *whole grains  -foot exam done, advised assistant to obtain eye report for diabetes  -fot BP will continue current meds, work on continued exercise and healthy diet/wt reduction  -continue BP, diabetes, cholesterol meds - changes as needed pending labs  -labs, studies and vaccines per orders this encounter   Patient advised to return to clinic immediately if symptoms worsen or persist or new concerns.  Patient Instructions  BEFORE YOU LEAVE: -obtain/abstract eye report from Dr. Rodena Piety -follow up: 3-4 months -labs  Keep up the exercise! Try to eat at least 5-9 servings of vegetables and fruits per day (not corn, potatoes or bananas.) Avoid sweets, red meat, pork, butter, fried foods, fast food, processed food, excessive dairy, eggs and coconut. Replace bad fats with good fats - fish, nuts and seeds, canola oil, olive oil.   We have ordered labs or studies at this visit. It  can take up to 1-2 weeks for results and processing. IF results require follow up or explanation, we will call you with instructions. Clinically stable results will be released to your [facility]. If you have not heard from [place] or cannot find your results in [facility] in 2 weeks please contact our office at [phone].  If you are not yet signed up for [facility], please consider signing up.    Health Maintenance, Male A healthy lifestyle and preventive care is important for your health and wellness. Ask your health care provider about what schedule of regular examinations is right for you. What should I know about weight and diet? Eat a Healthy Diet  Eat plenty of vegetables, fruits, whole grains, low-fat dairy products, and lean protein.  Do not eat a lot of foods high in solid fats, added sugars, or salt.  Maintain a Healthy Weight Regular exercise can help you achieve or maintain a healthy weight. You should:  Do at least 150 minutes of exercise each week. The exercise should increase your heart rate and make you sweat (moderate-intensity exercise).  Do strength-training exercises at least twice a week.  Watch Your Levels of Cholesterol and Blood Lipids  Have your blood tested for lipids and cholesterol every 5 years starting at 81 years of age. If you are at high risk for heart disease, you should start having your blood tested when you are 81 years old. You may need to have your cholesterol levels checked more often if: ? Your lipid or cholesterol levels are high. ? You are older than 81 years of age. ? You are at high risk for heart disease.  What should I know about cancer screening? Many types of cancers can be detected early and may often be prevented. Lung Cancer  You should be screened every year for lung cancer if: ? You are a current smoker who has smoked for at least 30 years. ? You are a former smoker who has quit within the past 15 years.  Talk to your health care  provider about your screening options, when you should start screening, and how often you should be screened.  Colorectal Cancer  Routine colorectal cancer screening usually begins at 81 years of age  and should be repeated every 5-10 years until you are 81 years old. You may need to be screened more often if early forms of precancerous polyps or small growths are found. Your health care provider may recommend screening at an earlier age if you have risk factors for colon cancer.  Your health care provider may recommend using home test kits to check for hidden blood in the stool.  A small camera at the end of a tube can be used to examine your colon (sigmoidoscopy or colonoscopy). This checks for the earliest forms of colorectal cancer.  Prostate and Testicular Cancer  Depending on your age and overall health, your health care provider may do certain tests to screen for prostate and testicular cancer.  Talk to your health care provider about any symptoms or concerns you have about testicular or prostate cancer.  Skin Cancer  Check your skin from head to toe regularly.  Tell your health care provider about any new moles or changes in moles, especially if: ? There is a change in a mole's size, shape, or color. ? You have a mole that is larger than a pencil eraser.  Always use sunscreen. Apply sunscreen liberally and repeat throughout the day.  Protect yourself by wearing long sleeves, pants, a wide-brimmed hat, and sunglasses when outside.  What should I know about heart disease, diabetes, and high blood pressure?  If you are 29-66 years of age, have your blood pressure checked every 3-5 years. If you are 47 years of age or older, have your blood pressure checked every year. You should have your blood pressure measured twice-once when you are at a hospital or clinic, and once when you are not at a hospital or clinic. Record the average of the two measurements. To check your blood pressure  when you are not at a hospital or clinic, you can use: ? An automated blood pressure machine at a pharmacy. ? A home blood pressure monitor.  Talk to your health care provider about your target blood pressure.  If you are between 28-2 years old, ask your health care provider if you should take aspirin to prevent heart disease.  Have regular diabetes screenings by checking your fasting blood sugar level. ? If you are at a normal weight and have a low risk for diabetes, have this test once every three years after the age of 91. ? If you are overweight and have a high risk for diabetes, consider being tested at a younger age or more often.  A one-time screening for abdominal aortic aneurysm (AAA) by ultrasound is recommended for men aged 69-75 years who are current or former smokers. What should I know about preventing infection? Hepatitis B If you have a higher risk for hepatitis B, you should be screened for this virus. Talk with your health care provider to find out if you are at risk for hepatitis B infection. Hepatitis C Blood testing is recommended for:  Everyone born from [birth year] through 1963.  Anyone with known risk factors for hepatitis C.  Sexually Transmitted Diseases (STDs)  You should be screened each year for STDs including gonorrhea and chlamydia if: ? You are sexually active and are younger than 81 years of age. ? You are older than 81 years of age and your health care provider tells you that you are at risk for this type of infection. ? Your sexual activity has changed since you were last screened and you are at an increased risk for chlamydia  or gonorrhea. Ask your health care provider if you are at risk.  Talk with your health care provider about whether you are at high risk of being infected with HIV. Your health care provider may recommend a prescription medicine to help prevent HIV infection.  What else can I do?  Schedule regular health, dental, and eye  exams.  Stay current with your vaccines (immunizations).  Do not use any tobacco products, such as cigarettes, chewing tobacco, and e-cigarettes. If you need help quitting, ask your health care provider.  Limit alcohol intake to no more than 2 drinks per day. One drink equals 12 ounces of beer, 5 ounces of wine, or 1 ounces of hard liquor.  Do not use street drugs.  Do not share needles.  Ask your health care provider for help if you need support or information about quitting drugs.  Tell your health care provider if you often feel depressed.  Tell your health care provider if you have ever been abused or do not feel safe at home. This information is not intended to replace advice given to you by your health care provider. Make sure you discuss any questions you have with your health care provider. Document Released: 12/08/2007 Document Revised: 02/08/2016 Document Reviewed: 03/15/2015 Elsevier Interactive Patient Education  2018 [employer].      No Follow-up on file.   Colin Benton R., DO

## 2016-11-22 ENCOUNTER — Encounter: Payer: Self-pay | Admitting: Family Medicine

## 2016-11-22 ENCOUNTER — Ambulatory Visit (INDEPENDENT_AMBULATORY_CARE_PROVIDER_SITE_OTHER): Payer: BLUE CROSS/BLUE SHIELD | Admitting: Family Medicine

## 2016-11-22 VITALS — BP 138/80 | HR 64 | Temp 97.5°F | Ht 66.5 in | Wt 191.5 lb

## 2016-11-22 DIAGNOSIS — E119 Type 2 diabetes mellitus without complications: Secondary | ICD-10-CM

## 2016-11-22 DIAGNOSIS — I10 Essential (primary) hypertension: Secondary | ICD-10-CM | POA: Diagnosis not present

## 2016-11-22 DIAGNOSIS — Z0000 Encounter for general adult medical examination without abnormal findings: Secondary | ICD-10-CM | POA: Diagnosis not present

## 2016-11-22 DIAGNOSIS — Z6830 Body mass index (BMI) 30.0-30.9, adult: Secondary | ICD-10-CM

## 2016-11-22 DIAGNOSIS — E785 Hyperlipidemia, unspecified: Secondary | ICD-10-CM

## 2016-11-22 LAB — BASIC METABOLIC PANEL
BUN: 25 mg/dL — AB (ref 6–23)
CO2: 24 mEq/L (ref 19–32)
CREATININE: 1.28 mg/dL (ref 0.40–1.50)
Calcium: 9.1 mg/dL (ref 8.4–10.5)
Chloride: 108 mEq/L (ref 96–112)
GFR: 57.4 mL/min — AB (ref >60.00)
GLUCOSE: 88 mg/dL (ref 70–99)
POTASSIUM: 4.7 meq/L (ref 3.5–5.1)
Sodium: 139 mEq/L (ref 135–145)

## 2016-11-22 LAB — LIPID PANEL
CHOL/HDL RATIO: 3
Cholesterol: 116 mg/dL (ref 0–200)
HDL: 35.6 mg/dL — ABNORMAL LOW (ref >39.00)
LDL Cholesterol: 60 mg/dL (ref 0–99)
NONHDL: 80.58
TRIGLYCERIDES: 104 mg/dL (ref 0.0–149.0)
VLDL: 20.8 mg/dL (ref 0.0–40.0)

## 2016-11-22 LAB — HEMOGLOBIN A1C: HEMOGLOBIN A1C: 6.9 % — AB (ref 4.6–6.5)

## 2016-11-22 LAB — CBC
HCT: 40.9 % (ref 39.0–52.0)
HEMOGLOBIN: 13.8 g/dL (ref 13.0–17.0)
MCHC: 33.9 g/dL (ref 30.0–36.0)
MCV: 88.6 fl (ref 78.0–100.0)
Platelets: 197 10*3/uL (ref 150.0–400.0)
RBC: 4.61 Mil/uL (ref 4.22–5.81)
RDW: 13.4 % (ref 11.5–15.5)
WBC: 9.6 10*3/uL (ref 4.0–10.5)

## 2016-11-22 NOTE — Patient Instructions (Signed)
BEFORE YOU LEAVE: -obtain/abstract eye report from Dr. Rodena Piety -follow up: 3-4 months -labs  Keep up the exercise! Try to eat at least 5-9 servings of vegetables and fruits per day (not corn, potatoes or bananas.) Avoid sweets, red meat, pork, butter, fried foods, fast food, processed food, excessive dairy, eggs and coconut. Replace bad fats with good fats - fish, nuts and seeds, canola oil, olive oil.   We have ordered labs or studies at this visit. It can take up to 1-2 weeks for results and processing. IF results require follow up or explanation, we will call you with instructions. Clinically stable results will be released to your [facility]. If you have not heard from [place] or cannot find your results in [facility] in 2 weeks please contact our office at [phone].  If you are not yet signed up for [facility], please consider signing up.    Health Maintenance, Male A healthy lifestyle and preventive care is important for your health and wellness. Ask your health care provider about what schedule of regular examinations is right for you. What should I know about weight and diet? Eat a Healthy Diet  Eat plenty of vegetables, fruits, whole grains, low-fat dairy products, and lean protein.  Do not eat a lot of foods high in solid fats, added sugars, or salt.  Maintain a Healthy Weight Regular exercise can help you achieve or maintain a healthy weight. You should:  Do at least 150 minutes of exercise each week. The exercise should increase your heart rate and make you sweat (moderate-intensity exercise).  Do strength-training exercises at least twice a week.  Watch Your Levels of Cholesterol and Blood Lipids  Have your blood tested for lipids and cholesterol every 5 years starting at 81 years of age. If you are at high risk for heart disease, you should start having your blood tested when you are 81 years old. You may need to have your cholesterol levels checked more often if: ? Your lipid or  cholesterol levels are high. ? You are older than 81 years of age. ? You are at high risk for heart disease.  What should I know about cancer screening? Many types of cancers can be detected early and may often be prevented. Lung Cancer  You should be screened every year for lung cancer if: ? You are a current smoker who has smoked for at least 30 years. ? You are a former smoker who has quit within the past 15 years.  Talk to your health care provider about your screening options, when you should start screening, and how often you should be screened.  Colorectal Cancer  Routine colorectal cancer screening usually begins at 81 years of age and should be repeated every 5-10 years until you are 81 years old. You may need to be screened more often if early forms of precancerous polyps or small growths are found. Your health care provider may recommend screening at an earlier age if you have risk factors for colon cancer.  Your health care provider may recommend using home test kits to check for hidden blood in the stool.  A small camera at the end of a tube can be used to examine your colon (sigmoidoscopy or colonoscopy). This checks for the earliest forms of colorectal cancer.  Prostate and Testicular Cancer  Depending on your age and overall health, your health care provider may do certain tests to screen for prostate and testicular cancer.  Talk to your health care provider about any  symptoms or concerns you have about testicular or prostate cancer.  Skin Cancer  Check your skin from head to toe regularly.  Tell your health care provider about any new moles or changes in moles, especially if: ? There is a change in a mole's size, shape, or color. ? You have a mole that is larger than a pencil eraser.  Always use sunscreen. Apply sunscreen liberally and repeat throughout the day.  Protect yourself by wearing long sleeves, pants, a wide-brimmed hat, and sunglasses when  outside.  What should I know about heart disease, diabetes, and high blood pressure?  If you are 58-82 years of age, have your blood pressure checked every 3-5 years. If you are 81 years of age or older, have your blood pressure checked every year. You should have your blood pressure measured twice-once when you are at a hospital or clinic, and once when you are not at a hospital or clinic. Record the average of the two measurements. To check your blood pressure when you are not at a hospital or clinic, you can use: ? An automated blood pressure machine at a pharmacy. ? A home blood pressure monitor.  Talk to your health care provider about your target blood pressure.  If you are between 54-61 years old, ask your health care provider if you should take aspirin to prevent heart disease.  Have regular diabetes screenings by checking your fasting blood sugar level. ? If you are at a normal weight and have a low risk for diabetes, have this test once every three years after the age of 78. ? If you are overweight and have a high risk for diabetes, consider being tested at a younger age or more often.  A one-time screening for abdominal aortic aneurysm (AAA) by ultrasound is recommended for men aged 85-75 years who are current or former smokers. What should I know about preventing infection? Hepatitis B If you have a higher risk for hepatitis B, you should be screened for this virus. Talk with your health care provider to find out if you are at risk for hepatitis B infection. Hepatitis C Blood testing is recommended for:  Everyone born from [birth year] through 1963.  Anyone with known risk factors for hepatitis C.  Sexually Transmitted Diseases (STDs)  You should be screened each year for STDs including gonorrhea and chlamydia if: ? You are sexually active and are younger than 81 years of age. ? You are older than 81 years of age and your health care provider tells you that you are at risk for this  type of infection. ? Your sexual activity has changed since you were last screened and you are at an increased risk for chlamydia or gonorrhea. Ask your health care provider if you are at risk.  Talk with your health care provider about whether you are at high risk of being infected with HIV. Your health care provider may recommend a prescription medicine to help prevent HIV infection.  What else can I do?  Schedule regular health, dental, and eye exams.  Stay current with your vaccines (immunizations).  Do not use any tobacco products, such as cigarettes, chewing tobacco, and e-cigarettes. If you need help quitting, ask your health care provider.  Limit alcohol intake to no more than 2 drinks per day. One drink equals 12 ounces of beer, 5 ounces of wine, or 1 ounces of hard liquor.  Do not use street drugs.  Do not share needles.  Ask your health care  provider for help if you need support or information about quitting drugs.  Tell your health care provider if you often feel depressed.  Tell your health care provider if you have ever been abused or do not feel safe at home. This information is not intended to replace advice given to you by your health care provider. Make sure you discuss any questions you have with your health care provider. Document Released: 12/08/2007 Document Revised: 02/08/2016 Document Reviewed: 03/15/2015 Elsevier Interactive Patient Education  [employer].

## 2016-11-26 ENCOUNTER — Encounter: Payer: Self-pay | Admitting: Family Medicine

## 2017-01-02 ENCOUNTER — Encounter (INDEPENDENT_AMBULATORY_CARE_PROVIDER_SITE_OTHER): Payer: BLUE CROSS/BLUE SHIELD | Admitting: Ophthalmology

## 2017-01-02 ENCOUNTER — Other Ambulatory Visit: Payer: Self-pay | Admitting: Family Medicine

## 2017-01-02 DIAGNOSIS — E11319 Type 2 diabetes mellitus with unspecified diabetic retinopathy without macular edema: Secondary | ICD-10-CM

## 2017-01-02 DIAGNOSIS — H353131 Nonexudative age-related macular degeneration, bilateral, early dry stage: Secondary | ICD-10-CM

## 2017-01-02 DIAGNOSIS — E113293 Type 2 diabetes mellitus with mild nonproliferative diabetic retinopathy without macular edema, bilateral: Secondary | ICD-10-CM

## 2017-01-02 DIAGNOSIS — H35033 Hypertensive retinopathy, bilateral: Secondary | ICD-10-CM | POA: Diagnosis not present

## 2017-01-02 DIAGNOSIS — I10 Essential (primary) hypertension: Secondary | ICD-10-CM

## 2017-01-02 DIAGNOSIS — H35713 Central serous chorioretinopathy, bilateral: Secondary | ICD-10-CM | POA: Diagnosis not present

## 2017-01-02 DIAGNOSIS — H43813 Vitreous degeneration, bilateral: Secondary | ICD-10-CM

## 2017-03-14 ENCOUNTER — Encounter: Payer: Self-pay | Admitting: Family Medicine

## 2017-04-08 ENCOUNTER — Other Ambulatory Visit: Payer: Self-pay | Admitting: Family Medicine

## 2017-04-08 DIAGNOSIS — I10 Essential (primary) hypertension: Secondary | ICD-10-CM

## 2017-04-11 ENCOUNTER — Ambulatory Visit (INDEPENDENT_AMBULATORY_CARE_PROVIDER_SITE_OTHER): Payer: BLUE CROSS/BLUE SHIELD | Admitting: *Deleted

## 2017-04-11 DIAGNOSIS — Z23 Encounter for immunization: Secondary | ICD-10-CM

## 2017-05-08 ENCOUNTER — Encounter (INDEPENDENT_AMBULATORY_CARE_PROVIDER_SITE_OTHER): Payer: BLUE CROSS/BLUE SHIELD | Admitting: Ophthalmology

## 2017-05-08 DIAGNOSIS — H43813 Vitreous degeneration, bilateral: Secondary | ICD-10-CM

## 2017-05-08 DIAGNOSIS — E113293 Type 2 diabetes mellitus with mild nonproliferative diabetic retinopathy without macular edema, bilateral: Secondary | ICD-10-CM

## 2017-05-08 DIAGNOSIS — E11319 Type 2 diabetes mellitus with unspecified diabetic retinopathy without macular edema: Secondary | ICD-10-CM

## 2017-05-08 DIAGNOSIS — H35712 Central serous chorioretinopathy, left eye: Secondary | ICD-10-CM | POA: Diagnosis not present

## 2017-05-08 DIAGNOSIS — I10 Essential (primary) hypertension: Secondary | ICD-10-CM | POA: Diagnosis not present

## 2017-05-08 DIAGNOSIS — H35033 Hypertensive retinopathy, bilateral: Secondary | ICD-10-CM

## 2017-05-22 NOTE — Progress Notes (Signed)
HPI:  Due for bmp, cbc, hgba1c Reports he is doing great.  He continues to exercise 30 minutes/day.  He feels his diet is healthy.  He denies chest pain, shortness of breath, palpitations, increased swelling in his legs or any other concerns today.  He is wearing diabetic socks.  Denies any wounds on his feet.  HTN w/ diabetes: -denies hx heart dz, palpitations, significant or worsening LE edema or CHF -current meds: lisinopril-hctz 20-12.5 (2 tablets), metoprolol 25 bid, norvasc 5 mg   Diabetes w/ CRI/HLD -conts statin -meds: asa, amaryl 1mg  daily, metfromin 1000mg  bid -fasting BS 90s and low 100s -eye examswith Dr. Zigmund Daniel   ROS: See pertinent positives and negatives per HPI.  Past Medical History:  Diagnosis Date  . BPH (benign prostatic hyperplasia)   . Colon polyp   . Diabetes mellitus without complication ([place])   . Hemorrhoid   . Hyperlipemia   . Hypertension   . Spinal stenosis     Past Surgical History:  Procedure Laterality Date  . APPENDECTOMY  1966  . COLONOSCOPY  2013   had it close to end of year, exam was normal  . Drain fluid from lungs  1938   states "I was 83 year old"  . LUMBAR WOUND DEBRIDEMENT N/A 02/24/2014   Procedure: Revision of Lumbar Wound;  Surgeon: Eustace Moore, MD;  Location: [facility];  Service: Neurosurgery;  Laterality: N/A;  Revision of Lumbar Wound  . MAXIMUM ACCESS (MAS)POSTERIOR LUMBAR INTERBODY FUSION (PLIF) 1 LEVEL N/A 01/21/2014   Procedure: FOR MAXIMUM ACCESS (MAS) POSTERIOR LUMBAR INTERBODY FUSION (PLIF) LUMBAR FOUR FIVE;  Surgeon: Eustace Moore, MD;  Location: [facility];  Service: Neurosurgery;  Laterality: N/A;    Family History  Problem Relation Age of Onset  . Colon cancer Neg Hx   . Heart disease Father     Social History   Socioeconomic History  . Marital status: Married    Spouse name: None  . Number of children: None  . Years of education: None  . Highest education level: None  Social Needs  . Financial  resource strain: None  . Food insecurity - worry: None  . Food insecurity - inability: None  . Transportation needs - medical: None  . Transportation needs - non-medical: None  Occupational History  . None  Tobacco Use  . Smoking status: Never Smoker  . Smokeless tobacco: Never Used  Substance and Sexual Activity  . Alcohol use: No    Comment: only drinks at Party/Holidays  . Drug use: No  . Sexual activity: None  Other Topics Concern  . None  Social History Narrative   Work or School: retired Data processing manager Situation: lives with wife      Spiritual Beliefs: none; believes in God      Lifestyle: not regular exercise; diet is good              Current Outpatient Medications:  .  amLODipine (NORVASC) 5 MG tablet, TAKE 1 TABLET (5 MG TOTAL) BY MOUTH DAILY., Disp: 90 tablet, Rfl: 0 .  ascorbic acid (VITAMIN C) 500 MG tablet, Take 500 mg by mouth daily. With [employer], Disp: , Rfl:  .  aspirin EC 81 MG tablet, Take 81 mg by mouth daily., Disp: , Rfl:  .  B Complex Vitamins (B COMPLEX 50) TABS, Take 1 tablet by mouth daily. , Disp: , Rfl:  .  Calcium-Vitamin D (CALTRATE 600 PLUS-VIT  D PO), Take 1 tablet by mouth daily. , Disp: , Rfl:  .  Cholecalciferol (VITAMIN D-3) 1000 UNITS CAPS, Take 1,000 Units by mouth daily., Disp: , Rfl:  .  glimepiride (AMARYL) 2 MG tablet, TAKE 1/2 TABLET BY MOUTH DAILY WITH BREAKFAST, Disp: 45 tablet, Rfl: 1 .  glucose blood (ONETOUCH VERIO) test strip, Use as instructed to check blood sugar once a day., Disp: 100 each, Rfl: 3 .  LATANOPROST OP, Apply 1 drop to eye. Each eye every morning, Disp: , Rfl:  .  lisinopril-hydrochlorothiazide (PRINZIDE,ZESTORETIC) 20-12.5 MG tablet, TAKE 2 TABLETS BY MOUTH DAILY., Disp: 180 tablet, Rfl: 0 .  metFORMIN (GLUCOPHAGE) 1000 MG tablet, TAKE ONE TABLET BY MOUTH TWICE A DAY WITH FOOD, Disp: 180 tablet, Rfl: 0 .  metoprolol tartrate (LOPRESSOR) 25 MG tablet, TAKE ONE TABLET BY MOUTH TWICE A DAY,  Disp: 180 tablet, Rfl: 0 .  Multiple Vitamins-Minerals (CENTRUM PO), Take 1 tablet by mouth daily. , Disp: , Rfl:  .  ONETOUCH DELICA LANCETS 78G MISC, Use as directed to check blood sugar once a day, Disp: 100 each, Rfl: 3 .  simvastatin (ZOCOR) 20 MG tablet, TAKE 1 TABLET (20 MG TOTAL) BY MOUTH EVERY EVENING., Disp: 90 tablet, Rfl: 0 .  timolol (TIMOPTIC) 0.5 % ophthalmic solution, Place 1 drop into both eyes daily. , Disp: , Rfl:  .  vitamin E 400 UNIT capsule, Take 400 Units by mouth daily., Disp: , Rfl: Jump to patient instructions  EXAM:  Vitals:   05/23/17 0857  BP: 117/70  Pulse: 64  Temp: 97.8 F (36.6 C)    Body mass index is 30.14 kg/m.  GENERAL: vitals reviewed and listed above, alert, oriented, appears well hydrated and in no acute distress  HEENT: atraumatic, conjunttiva clear, no obvious abnormalities on inspection of external nose and ears  NECK: no obvious masses on inspection  LUNGS: clear to auscultation bilaterally, no wheezes, rales or rhonchi, good air movement  CV: HRRR, no peripheral edema  MS: moves all extremities without noticeable abnormality  PSYCH: pleasant and cooperative, no obvious depression or anxiety  ASSESSMENT AND PLAN:  Discussed the following assessment and plan:  Type 2 diabetes mellitus with chronic kidney disease, without long-term current use of insulin, unspecified CKD stage ([place]) - Plan: Hemoglobin A1c  Hyperlipidemia associated with type 2 diabetes mellitus ([place])  Hypertension associated with diabetes ([place]) - Plan: Basic metabolic panel, CBC  Chronic renal insufficiency, stage III (moderate) (HCC)  BMI 30.0-30.9,adult  -He reports he is doing well today -We will check his labs to monitor -Continue current medications, pending lab work results -Lifestyle recommendations -Follow-up in 3-4 months -Patient advised to return or notify a doctor immediately if symptoms worsen or persist or new concerns arise.  Patient  Instructions  BEFORE YOU LEAVE: -Lab -follow up: 3-4 months  We have ordered labs or studies at this visit. It can take up to 1-2 weeks for results and processing. IF results require follow up or explanation, we will call you with instructions. Clinically stable results will be released to your [facility]. If you have not heard from [place] or cannot find your results in [facility] in 2 weeks please contact our office at [phone].  If you are not yet signed up for [facility], please consider signing up.   We recommend the following healthy lifestyle for LIFE: 1) Small portions. But, make sure to get regular (at least 3 per day), healthy meals and small healthy snacks if needed.  2) Eat  a healthy clean diet.   TRY TO EAT: -at least 5-7 servings of low sugar, colorful, and nutrient rich vegetables per day (not corn, potatoes or bananas.) -berries are the best choice if you wish to eat fruit (only eat small amounts if trying to reduce weight)  -lean meets (fish, white meat of chicken or [place]) -vegan proteins for some meals - beans or tofu, whole grains, nuts and seeds -Replace bad fats with good fats - good fats include: fish, nuts and seeds, canola oil, olive oil -small amounts of low fat or non fat dairy -small amounts of100 % whole grains - check the lables -drink plenty of water  AVOID: -SUGAR, sweets, anything with added sugar, corn syrup or sweeteners - must read labels as even foods advertised as "healthy" often are loaded with sugar -if you must have a sweetener, small amounts of stevia may be best -sweetened beverages and artificially sweetened beverages -simple starches (rice, bread, potatoes, pasta, chips, etc - small amounts of 100% whole grains are ok) -red meat, pork, butter -fried foods, fast food, processed food, excessive dairy, eggs and coconut.  3)Get at least 150 minutes of sweaty aerobic exercise per week.  4)Reduce stress - consider counseling, meditation and relaxation  to balance other aspects of your life. WE NOW OFFER   [facility] FAST TRACK!!!  SAME DAY Appointments for ACUTE CARE  Such as: Sprains, Injuries, cuts, abrasions, rashes, muscle pain, joint pain, back pain Colds, flu, sore throats, headache, allergies, cough, fever  Ear pain, sinus and eye infections Abdominal pain, nausea, vomiting, diarrhea, upset stomach Animal/insect bites  3 Easy Ways to Schedule: Walk-In Scheduling Call in scheduling Mychart Sign-up: [email or web address]            Colin Benton R., DO

## 2017-05-23 ENCOUNTER — Encounter: Payer: Self-pay | Admitting: Family Medicine

## 2017-05-23 ENCOUNTER — Ambulatory Visit: Payer: BLUE CROSS/BLUE SHIELD | Admitting: Family Medicine

## 2017-05-23 VITALS — BP 117/70 | HR 64 | Temp 97.8°F | Ht 66.5 in | Wt 189.6 lb

## 2017-05-23 DIAGNOSIS — E785 Hyperlipidemia, unspecified: Secondary | ICD-10-CM

## 2017-05-23 DIAGNOSIS — Z6830 Body mass index (BMI) 30.0-30.9, adult: Secondary | ICD-10-CM

## 2017-05-23 DIAGNOSIS — I152 Hypertension secondary to endocrine disorders: Secondary | ICD-10-CM

## 2017-05-23 DIAGNOSIS — E1169 Type 2 diabetes mellitus with other specified complication: Secondary | ICD-10-CM | POA: Diagnosis not present

## 2017-05-23 DIAGNOSIS — I10 Essential (primary) hypertension: Secondary | ICD-10-CM

## 2017-05-23 DIAGNOSIS — E1122 Type 2 diabetes mellitus with diabetic chronic kidney disease: Secondary | ICD-10-CM | POA: Diagnosis not present

## 2017-05-23 DIAGNOSIS — N183 Chronic kidney disease, stage 3 unspecified: Secondary | ICD-10-CM

## 2017-05-23 DIAGNOSIS — E1159 Type 2 diabetes mellitus with other circulatory complications: Secondary | ICD-10-CM | POA: Diagnosis not present

## 2017-05-23 LAB — BASIC METABOLIC PANEL
BUN: 26 mg/dL — AB (ref 6–23)
CHLORIDE: 105 meq/L (ref 96–112)
CO2: 25 mEq/L (ref 19–32)
Calcium: 9.6 mg/dL (ref 8.4–10.5)
Creatinine, Ser: 1.24 mg/dL (ref 0.40–1.50)
GFR: 59.47 mL/min — ABNORMAL LOW (ref >60.00)
Glucose, Bld: 102 mg/dL — ABNORMAL HIGH (ref 70–99)
POTASSIUM: 5 meq/L (ref 3.5–5.1)
Sodium: 138 mEq/L (ref 135–145)

## 2017-05-23 LAB — CBC
HEMATOCRIT: 43.3 % (ref 39.0–52.0)
HEMOGLOBIN: 14.3 g/dL (ref 13.0–17.0)
MCHC: 33.1 g/dL (ref 30.0–36.0)
MCV: 91.6 fl (ref 78.0–100.0)
Platelets: 221 10*3/uL (ref 150.0–400.0)
RBC: 4.73 Mil/uL (ref 4.22–5.81)
RDW: 14 % (ref 11.5–15.5)
WBC: 12.5 10*3/uL — AB (ref 4.0–10.5)

## 2017-05-23 LAB — HEMOGLOBIN A1C: Hgb A1c MFr Bld: 6.6 % — ABNORMAL HIGH (ref 4.6–6.5)

## 2017-05-23 NOTE — Patient Instructions (Addendum)
BEFORE YOU LEAVE: -Lab -follow up: 3-4 months  We have ordered labs or studies at this visit. It can take up to 1-2 weeks for results and processing. IF results require follow up or explanation, we will call you with instructions. Clinically stable results will be released to your [facility]. If you have not heard from [place] or cannot find your results in [facility] in 2 weeks please contact our office at [phone].  If you are not yet signed up for [facility], please consider signing up.   We recommend the following healthy lifestyle for LIFE: 1) Small portions. But, make sure to get regular (at least 3 per day), healthy meals and small healthy snacks if needed.  2) Eat a healthy clean diet.   TRY TO EAT: -at least 5-7 servings of low sugar, colorful, and nutrient rich vegetables per day (not corn, potatoes or bananas.) -berries are the best choice if you wish to eat fruit (only eat small amounts if trying to reduce weight)  -lean meets (fish, white meat of chicken or [place]) -vegan proteins for some meals - beans or tofu, whole grains, nuts and seeds -Replace bad fats with good fats - good fats include: fish, nuts and seeds, canola oil, olive oil -small amounts of low fat or non fat dairy -small amounts of100 % whole grains - check the lables -drink plenty of water  AVOID: -SUGAR, sweets, anything with added sugar, corn syrup or sweeteners - must read labels as even foods advertised as "healthy" often are loaded with sugar -if you must have a sweetener, small amounts of stevia may be best -sweetened beverages and artificially sweetened beverages -simple starches (rice, bread, potatoes, pasta, chips, etc - small amounts of 100% whole grains are ok) -red meat, pork, butter -fried foods, fast food, processed food, excessive dairy, eggs and coconut.  3)Get at least 150 minutes of sweaty aerobic exercise per week.  4)Reduce stress - consider counseling, meditation and relaxation to balance other  aspects of your life. WE NOW OFFER   [facility] [facility] FAST TRACK!!!  SAME DAY Appointments for ACUTE CARE  Such as: Sprains, Injuries, cuts, abrasions, rashes, muscle pain, joint pain, back pain Colds, flu, sore throats, headache, allergies, cough, fever  Ear pain, sinus and eye infections Abdominal pain, nausea, vomiting, diarrhea, upset stomach Animal/insect bites  3 Easy Ways to Schedule: Walk-In Scheduling Call in scheduling Mychart Sign-up: [email or web address]

## 2017-06-13 DIAGNOSIS — E113293 Type 2 diabetes mellitus with mild nonproliferative diabetic retinopathy without macular edema, bilateral: Secondary | ICD-10-CM | POA: Diagnosis not present

## 2017-06-13 DIAGNOSIS — H401132 Primary open-angle glaucoma, bilateral, moderate stage: Secondary | ICD-10-CM | POA: Diagnosis not present

## 2017-07-02 ENCOUNTER — Other Ambulatory Visit: Payer: Self-pay | Admitting: Family Medicine

## 2017-07-02 DIAGNOSIS — I10 Essential (primary) hypertension: Secondary | ICD-10-CM

## 2017-07-04 ENCOUNTER — Encounter: Payer: Self-pay | Admitting: Family Medicine

## 2017-07-04 NOTE — Telephone Encounter (Signed)
Patient's wife came in to inquire on getting his medications refilled.  She stated it had been two days since when the pharmacy had been sending requests.

## 2017-07-06 ENCOUNTER — Other Ambulatory Visit: Payer: Self-pay | Admitting: Family Medicine

## 2017-09-30 ENCOUNTER — Other Ambulatory Visit: Payer: Self-pay | Admitting: Family Medicine

## 2017-09-30 DIAGNOSIS — I10 Essential (primary) hypertension: Secondary | ICD-10-CM

## 2017-10-04 ENCOUNTER — Other Ambulatory Visit: Payer: Self-pay | Admitting: Family Medicine

## 2017-10-04 DIAGNOSIS — I10 Essential (primary) hypertension: Secondary | ICD-10-CM

## 2017-11-06 ENCOUNTER — Encounter (INDEPENDENT_AMBULATORY_CARE_PROVIDER_SITE_OTHER): Payer: BLUE CROSS/BLUE SHIELD | Admitting: Ophthalmology

## 2017-11-07 ENCOUNTER — Encounter (INDEPENDENT_AMBULATORY_CARE_PROVIDER_SITE_OTHER): Payer: BLUE CROSS/BLUE SHIELD | Admitting: Ophthalmology

## 2017-11-13 ENCOUNTER — Encounter (INDEPENDENT_AMBULATORY_CARE_PROVIDER_SITE_OTHER): Payer: BLUE CROSS/BLUE SHIELD | Admitting: Ophthalmology

## 2017-11-13 DIAGNOSIS — H35712 Central serous chorioretinopathy, left eye: Secondary | ICD-10-CM | POA: Diagnosis not present

## 2017-11-13 DIAGNOSIS — H2513 Age-related nuclear cataract, bilateral: Secondary | ICD-10-CM | POA: Diagnosis not present

## 2017-11-13 DIAGNOSIS — I10 Essential (primary) hypertension: Secondary | ICD-10-CM | POA: Diagnosis not present

## 2017-11-13 DIAGNOSIS — E113293 Type 2 diabetes mellitus with mild nonproliferative diabetic retinopathy without macular edema, bilateral: Secondary | ICD-10-CM

## 2017-11-13 DIAGNOSIS — E11319 Type 2 diabetes mellitus with unspecified diabetic retinopathy without macular edema: Secondary | ICD-10-CM

## 2017-11-13 DIAGNOSIS — H43813 Vitreous degeneration, bilateral: Secondary | ICD-10-CM | POA: Diagnosis not present

## 2017-11-13 DIAGNOSIS — H35033 Hypertensive retinopathy, bilateral: Secondary | ICD-10-CM

## 2017-11-27 NOTE — Progress Notes (Signed)
HPI:  Using dictation device. Unfortunately this device frequently misinterprets words/phrases.  Here for CPE and follow up: Due for labs, eye exam, foot exam -Concerns and/or follow up today:   Brandon Santos is a pleasant 84 y.o. here for follow up and CPE. Chronic medical problems summarized below were reviewed for changes.Doing well. Reports had his eye exam with Dr. Rodena Piety recently. Sees Dr. Clydene Laming for Glaucoma. Denies CP, SOB, DOE, treatment intolerance or new symptoms.  HTN w/ diabetes: -denies hx heart dz, palpitations, significant or worsening LE edema or CHF -current meds: lisinopril-hctz 20-12.5 (2 tablets), metoprolol 25 bid, norvasc 5 mg   Diabetes w/ CRI/HLD -conts statin -meds: asa, amaryl 88m daily, metfromin 10075mbid -fasting BS 90s and low 100s -eye examswith Dr. MaZigmund Daniel-Diet: variety of foods, balance and well rounded, larger portion sizes -Exercise: no regular exercise -Diabetes and Dyslipidemia Screening: due for labs -Vaccines: UTD -sexual activity: yes, male partner, no new partners -wants STI testing, Hep C screening (if born [birth year] no -FH colon or prstate ca: see FH Last colon cancer screening: last done 2013, n/a Last prostate ca screening: n/a -Alcohol, Tobacco, drug use: see social history  Review of Systems - no fevers, unintentional weight loss, vision loss, hearing loss, chest pain, sob, hemoptysis, melena, hematochezia, hematuria, genital discharge, changing or concerning skin lesions, bleeding, bruising, loc, thoughts of self harm or SI  Past Medical History:  Diagnosis Date  . BPH (benign prostatic hyperplasia)   . Colon polyp   . Diabetes mellitus without complication ([place]  . Hemorrhoid   . Hyperlipemia   . Hypertension   . Spinal stenosis     Past Surgical History:  Procedure Laterality Date  . APPENDECTOMY  1966  . COLONOSCOPY  2013   had it close to end of year, exam was normal  . Drain fluid from lungs  1938   states "I was [age]ear old"  . LUMBAR WOUND DEBRIDEMENT N/A 02/24/2014   Procedure: Revision of Lumbar Wound;  Surgeon: DaEustace MooreMD;  [facility];  Service: Neurosurgery;  Laterality: N/A;  Revision of Lumbar Wound  . MAXIMUM ACCESS (MAS)POSTERIOR LUMBAR INTERBODY FUSION (PLIF) 1 LEVEL N/A 01/21/2014   Procedure: FOR MAXIMUM ACCESS (MAS) POSTERIOR LUMBAR INTERBODY FUSION (PLIF) LUMBAR FOUR FIVE;  Surgeon: DaEustace MooreMD;  [facility];  Service: Neurosurgery;  Laterality: N/A;    Family History  Problem Relation Age of Onset  . Colon cancer Neg Hx   . Heart disease Father     Social History   Socioeconomic History  . Marital status: Married    Spouse name: Not on file  . Number of children: Not on file  . Years of education: Not on file  . Highest education level: Not on file  Occupational History  . Not on file  Social Needs  . Financial resource strain: Not on file  . Food insecurity:    Worry: Not on file    Inability: Not on file  . Transportation needs:    Medical: Not on file    Non-medical: Not on file  Tobacco Use  . Smoking status: Never Smoker  . Smokeless tobacco: Never Used  Substance and Sexual Activity  . Alcohol use: No    Comment: only drinks at Party/Holidays  . Drug use: No  . Sexual activity: Not on file  Lifestyle  . Physical activity:    Days per week: Not on file    Minutes  per session: Not on file  . Stress: Not on file  Relationships  . Social connections:    Talks on phone: Not on file    Gets together: Not on file    Attends religious service: Not on file    Active member of club or organization: Not on file    Attends meetings of clubs or organizations: Not on file    Relationship status: Not on file  Other Topics Concern  . 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  amLODipine (NORVASC) 5 MG tablet, TAKE ONE TABLET BY MOUTH DAILY , Disp: 90 tablet, Rfl: 0 .  aspirin EC 81 MG tablet, Take 81 mg by mouth daily., Disp: , Rfl:  .  glimepiride (AMARYL) 2 MG tablet, TAKE 1/2 TABLET BY MOUTH DAILY WITH BREAKFAST, Disp: 45 tablet, Rfl: 1 .  glucose blood (ONETOUCH VERIO) test strip, Use as instructed to check blood sugar once a day., Disp: 100 each, Rfl: 3 .  LATANOPROST OP, Apply 1 drop to eye. Each eye every morning, Disp: , Rfl:  .  lisinopril-hydrochlorothiazide (PRINZIDE,ZESTORETIC) 20-12.5 MG tablet, TAKE TWO TABLETS BY MOUTH DAILY, Disp: 180 tablet, Rfl: 0 .  metFORMIN (GLUCOPHAGE) 1000 MG tablet, TAKE ONE TABLET BY MOUTH TWICE A DAY WITH FOOD, Disp: 180 tablet, Rfl: 0 .  metoprolol tartrate (LOPRESSOR) 25 MG tablet, TAKE ONE TABLET BY MOUTH TWICE A DAY, Disp: 180 tablet, Rfl: 0 .  Multiple Vitamins-Minerals (CENTRUM PO), Take 1 tablet by mouth daily. , Disp: , Rfl:  .  ONETOUCH DELICA LANCETS 50V MISC, Use as directed to check blood sugar once a day, Disp: 100 each, Rfl: 3 .  simvastatin (ZOCOR) 20 MG tablet,  Disp: 90 tablet, Rfl: 0 .  timolol (TIMOPTIC) 0.5 % ophthalmic solution, Place 1 drop into both eyes daily. , Disp: , Rfl:   EXAM:  Vitals:   11/28/17 0933  BP: 118/68  Pulse: 62  Temp: 98.1 F (36.7 C)  TempSrc: Oral  Weight: 190 lb 1.6 oz (86.2 kg)  Height: 5' 6.5" (1.689 m)    Estimated body mass index is 30.22 kg/m as calculated from the following:   Height as of this encounter: 5' 6.5" (1.689 m).   Weight as of this encounter: 190 lb 1.6 oz (86.2 kg).  GENERAL: vitals reviewed and listed below, alert, oriented, appears well hydrated and in no acute distress  HEENT: head atraumatic, PERRLA, normal appearance of eyes, ears, nose and mouth. moist mucus membranes.  NECK: supple, no masses or lymphadenopathy  LUNGS: clear to  auscultation bilaterally, no rales, rhonchi or wheeze  CV: HRRR, no peripheral edema or cyanosis, normal pedal pulses  ABDOMEN: bowel sounds normal, soft, non tender to palpation, no masses, no rebound or guarding  GU: declined unclothed exam  SKIN: no rash or abnormal lesions  MS: normal gait, moves all extremities normally  NEURO: normal gait, speech and thought processing grossly intact, muscle tone grossly intact throughout  PSYCH: normal affect, pleasant and cooperative  ASSESSMENT AND PLAN:  Discussed the following assessment and plan:  PREVENTIVE EXAM: -Discussed and advised all [place] preventive services health task force level A  and B recommendations for age, sex and risks. -Advised at least 150 minutes of exercise per week and a healthy diet with avoidance of (less then 1 serving per week) processed foods, white starches, red meat, fast foods and sweets and consisting of: * 5-9 servings of fresh fruits and vegetables (not corn or potatoes) *nuts and seeds, beans *olives and olive oil *lean meats such as fish and white chicken  *whole grains -labs, studies and vaccines per orders this encounter   2. Screening for depression -see phq9  3. Type 2 diabetes mellitus with chronic kidney disease, without long-term current use of insulin, unspecified CKD stage ([place]) -labs -lifestyle recs -discussed risks/benefits asa therapy and have opted to stop  4. Hypertension associated with diabetes ([place]) -labs -cont current therapy - BP high on arrival but great after sitting - reports has been this way always with dr. visits  5. Hyperlipidemia associated with type 2 diabetes mellitus ([place]) -labs -lifestyle recs -cont current therapy   Patient Instructions  BEFORE YOU LEAVE: -labs -abstract eye exam  -follow up: 4 months  We have ordered labs or studies at this visit. It can take up to 1-2 weeks for results and processing. IF results require follow up or explanation, we  will call you with instructions. Clinically stable results will be released to your [facility]. If you have not heard from [place] or cannot find your results in [facility] in 2 weeks please contact our office at [phone].  If you are not yet signed up for [facility], please consider signing up.   Preventive Care 32 Years and Older, Male Preventive care refers to lifestyle choices and visits with your health care provider that can promote health and wellness. What does preventive care include?  A yearly physical exam. This is also called an annual well check.  Dental exams once or twice a year.  Routine eye exams. Ask your health care provider how often you should have your eyes checked.  Personal lifestyle choices, including: ? Daily care of your teeth and gums. ? Regular physical activity. ? Eating a healthy diet. ? Avoiding tobacco and drug use. ? Limiting alcohol use. ? Practicing safe sex. ? Taking vitamin and mineral supplements as recommended by your health care provider. What happens during an annual well check? The services and screenings done by your health care provider during your annual well check will depend on your age, overall health, lifestyle risk factors, and family history of disease. Counseling Your health care provider may ask you questions about your:  Alcohol use.  Tobacco use.  Drug use.  Emotional well-being.  Home and relationship well-being.  Sexual activity.  Eating habits.  History of falls.  Memory and ability to understand (cognition).  Work and work Statistician.  Screening You may have the following tests or measurements:  Height, weight, and BMI.  Blood pressure.  Lipid and cholesterol levels. These may be checked every 5 years, or more frequently if you are over 37 years old.  Skin check.  Lung cancer screening. You may have this screening every year starting at age 71 if you have a 30-pack-year history of smoking and currently smoke  or have quit within the past 15 years.  Fecal occult blood test (FOBT) of the stool. You may have this test every year starting at age 63.  Flexible sigmoidoscopy or colonoscopy. You may have a sigmoidoscopy every 5 years or a colonoscopy every 10 years starting at age 42.  Prostate cancer screening. Recommendations will  vary depending on your family history and other risks.  Hepatitis C blood test.  Hepatitis B blood test.  Sexually transmitted disease (STD) testing.  Diabetes screening. This is done by checking your blood sugar (glucose) after you have not eaten for a while (fasting). You may have this done every 1-3 years.  Abdominal aortic aneurysm (AAA) screening. You may need this if you are a current or former smoker.  Osteoporosis. You may be screened starting at age 60 if you are at high risk.  Talk with your health care provider about your test results, treatment options, and if necessary, the need for more tests. Vaccines Your health care provider may recommend certain vaccines, such as:  Influenza vaccine. This is recommended every year.  Tetanus, diphtheria, and acellular pertussis (Tdap, Td) vaccine. You may need a Td booster every 10 years.  Varicella vaccine. You may need this if you have not been vaccinated.  Zoster vaccine. You may need this after age 10.  Measles, mumps, and rubella (MMR) vaccine. You may need at least one dose of MMR if you were born in 1957 or later. You may also need a second dose.  Pneumococcal 13-valent conjugate (PCV13) vaccine. One dose is recommended after age 11.  Pneumococcal polysaccharide (PPSV23) vaccine. One dose is recommended after age 59.  Meningococcal vaccine. You may need this if you have certain conditions.  Hepatitis A vaccine. You may need this if you have certain conditions or if you travel or work in places where you may be exposed to hepatitis A.  Hepatitis B vaccine. You may need this if you have certain  conditions or if you travel or work in places where you may be exposed to hepatitis B.  Haemophilus influenzae type b (Hib) vaccine. You may need this if you have certain risk factors.  Talk to your health care provider about which screenings and vaccines you need and how often you need them. This information is not intended to replace advice given to you by your health care provider. Make sure you discuss any questions you have with your health care provider. Document Released: 07/08/2015 Document Revised: 02/29/2016 Document Reviewed: 04/12/2015 Elsevier Interactive Patient Education  2018 [employer].          No follow-ups on file.   Lucretia Kern, DO

## 2017-11-28 ENCOUNTER — Encounter: Payer: Self-pay | Admitting: Family Medicine

## 2017-11-28 ENCOUNTER — Ambulatory Visit (INDEPENDENT_AMBULATORY_CARE_PROVIDER_SITE_OTHER): Payer: BLUE CROSS/BLUE SHIELD | Admitting: Family Medicine

## 2017-11-28 VITALS — BP 118/68 | HR 62 | Temp 98.1°F | Ht 66.5 in | Wt 190.1 lb

## 2017-11-28 DIAGNOSIS — E785 Hyperlipidemia, unspecified: Secondary | ICD-10-CM | POA: Diagnosis not present

## 2017-11-28 DIAGNOSIS — I10 Essential (primary) hypertension: Secondary | ICD-10-CM | POA: Diagnosis not present

## 2017-11-28 DIAGNOSIS — E1169 Type 2 diabetes mellitus with other specified complication: Secondary | ICD-10-CM | POA: Diagnosis not present

## 2017-11-28 DIAGNOSIS — Z1331 Encounter for screening for depression: Secondary | ICD-10-CM | POA: Diagnosis not present

## 2017-11-28 DIAGNOSIS — E1159 Type 2 diabetes mellitus with other circulatory complications: Secondary | ICD-10-CM

## 2017-11-28 DIAGNOSIS — E1122 Type 2 diabetes mellitus with diabetic chronic kidney disease: Secondary | ICD-10-CM | POA: Diagnosis not present

## 2017-11-28 DIAGNOSIS — Z0000 Encounter for general adult medical examination without abnormal findings: Secondary | ICD-10-CM | POA: Diagnosis not present

## 2017-11-28 DIAGNOSIS — I152 Hypertension secondary to endocrine disorders: Secondary | ICD-10-CM

## 2017-11-28 LAB — LIPID PANEL
Cholesterol: 149 mg/dL (ref 0–200)
HDL: 37.4 mg/dL — AB (ref >39.00)
LDL Cholesterol: 78 mg/dL (ref 0–99)
NONHDL: 111.94
Total CHOL/HDL Ratio: 4
Triglycerides: 170 mg/dL — ABNORMAL HIGH (ref 0.0–149.0)
VLDL: 34 mg/dL (ref 0.0–40.0)

## 2017-11-28 LAB — BASIC METABOLIC PANEL
BUN: 24 mg/dL — AB (ref 6–23)
CO2: 24 mEq/L (ref 19–32)
CREATININE: 1.18 mg/dL (ref 0.40–1.50)
Calcium: 9.5 mg/dL (ref 8.4–10.5)
Chloride: 106 mEq/L (ref 96–112)
GFR: 62.89 mL/min (ref >60.00)
Glucose, Bld: 118 mg/dL — ABNORMAL HIGH (ref 70–99)
Potassium: 4.6 mEq/L (ref 3.5–5.1)
Sodium: 139 mEq/L (ref 135–145)

## 2017-11-28 LAB — CBC
HEMATOCRIT: 42.8 % (ref 39.0–52.0)
Hemoglobin: 14.4 g/dL (ref 13.0–17.0)
MCHC: 33.6 g/dL (ref 30.0–36.0)
MCV: 89.4 fl (ref 78.0–100.0)
Platelets: 205 10*3/uL (ref 150.0–400.0)
RBC: 4.79 Mil/uL (ref 4.22–5.81)
RDW: 13.6 % (ref 11.5–15.5)
WBC: 9.6 10*3/uL (ref 4.0–10.5)

## 2017-11-28 LAB — HEMOGLOBIN A1C: HEMOGLOBIN A1C: 6.8 % — AB (ref 4.6–6.5)

## 2017-11-28 NOTE — Patient Instructions (Signed)
BEFORE YOU LEAVE: -labs -abstract eye exam  -follow up: 4 months  We have ordered labs or studies at this visit. It can take up to 1-2 weeks for results and processing. IF results require follow up or explanation, we will call you with instructions. Clinically stable results will be released to your [facility]. If you have not heard from [place] or cannot find your results in [facility] in 2 weeks please contact our office at [phone].  If you are not yet signed up for [facility], please consider signing up.   Preventive Care 60 Years and Older, Male Preventive care refers to lifestyle choices and visits with your health care provider that can promote health and wellness. What does preventive care include?  A yearly physical exam. This is also called an annual well check.  Dental exams once or twice a year.  Routine eye exams. Ask your health care provider how often you should have your eyes checked.  Personal lifestyle choices, including: ? Daily care of your teeth and gums. ? Regular physical activity. ? Eating a healthy diet. ? Avoiding tobacco and drug use. ? Limiting alcohol use. ? Practicing safe sex. ? Taking vitamin and mineral supplements as recommended by your health care provider. What happens during an annual well check? The services and screenings done by your health care provider during your annual well check will depend on your age, overall health, lifestyle risk factors, and family history of disease. Counseling Your health care provider may ask you questions about your:  Alcohol use.  Tobacco use.  Drug use.  Emotional well-being.  Home and relationship well-being.  Sexual activity.  Eating habits.  History of falls.  Memory and ability to understand (cognition).  Work and work Statistician.  Screening You may have the following tests or measurements:  Height, weight, and BMI.  Blood pressure.  Lipid and cholesterol levels. These may be checked every 5  years, or more frequently if you are over 60 years old.  Skin check.  Lung cancer screening. You may have this screening every year starting at age 53 if you have a 30-pack-year history of smoking and currently smoke or have quit within the past 15 years.  Fecal occult blood test (FOBT) of the stool. You may have this test every year starting at age 36.  Flexible sigmoidoscopy or colonoscopy. You may have a sigmoidoscopy every 5 years or a colonoscopy every 10 years starting at age 19.  Prostate cancer screening. Recommendations will vary depending on your family history and other risks.  Hepatitis C blood test.  Hepatitis B blood test.  Sexually transmitted disease (STD) testing.  Diabetes screening. This is done by checking your blood sugar (glucose) after you have not eaten for a while (fasting). You may have this done every 1-3 years.  Abdominal aortic aneurysm (AAA) screening. You may need this if you are a current or former smoker.  Osteoporosis. You may be screened starting at age 79 if you are at high risk.  Talk with your health care provider about your test results, treatment options, and if necessary, the need for more tests. Vaccines Your health care provider may recommend certain vaccines, such as:  Influenza vaccine. This is recommended every year.  Tetanus, diphtheria, and acellular pertussis (Tdap, Td) vaccine. You may need a Td booster every 10 years.  Varicella vaccine. You may need this if you have not been vaccinated.  Zoster vaccine. You may need this after age 54.  Measles, mumps, and rubella (  MMR) vaccine. You may need at least one dose of MMR if you were born in 1957 or later. You may also need a second dose.  Pneumococcal 13-valent conjugate (PCV13) vaccine. One dose is recommended after age 20.  Pneumococcal polysaccharide (PPSV23) vaccine. One dose is recommended after age 87.  Meningococcal vaccine. You may need this if you have certain  conditions.  Hepatitis A vaccine. You may need this if you have certain conditions or if you travel or work in places where you may be exposed to hepatitis A.  Hepatitis B vaccine. You may need this if you have certain conditions or if you travel or work in places where you may be exposed to hepatitis B.  Haemophilus influenzae type b (Hib) vaccine. You may need this if you have certain risk factors.  Talk to your health care provider about which screenings and vaccines you need and how often you need them. This information is not intended to replace advice given to you by your health care provider. Make sure you discuss any questions you have with your health care provider. Document Released: 07/08/2015 Document Revised: 02/29/2016 Document Reviewed: 04/12/2015 Elsevier Interactive Patient Education  [employer].

## 2017-12-03 DIAGNOSIS — E113293 Type 2 diabetes mellitus with mild nonproliferative diabetic retinopathy without macular edema, bilateral: Secondary | ICD-10-CM | POA: Diagnosis not present

## 2017-12-03 DIAGNOSIS — H401132 Primary open-angle glaucoma, bilateral, moderate stage: Secondary | ICD-10-CM | POA: Diagnosis not present

## 2017-12-20 ENCOUNTER — Telehealth: Payer: Self-pay

## 2017-12-20 NOTE — Telephone Encounter (Signed)
Copied from [place] [id number]. Topic: Bill or Statement - Patient/Guarantor Inquiry >> Dec 20, 2017  1:05 PM Synthia Innocent wrote: Receiving bill for copay for CPE on 11/28/17, would like to know why he is being charged. States nothing else was done but CPE   Dr. Maudie Mercury - I did not see anything in the notes about an acute issue. Please advise if OV should have been charged. Thanks!

## 2017-12-21 NOTE — Telephone Encounter (Signed)
It looks like he does his follow up with his physical rather then every 4 month as would be the case if did not do with physical. Please check with coders, but did follow up the same day.Thanks.

## 2017-12-23 NOTE — Telephone Encounter (Signed)
I no longer have transaction inquiry so I cannot research the pt's bill.   Could you please review DOS and codes to make sure all is appropriate?  Thanks!

## 2018-02-10 NOTE — Telephone Encounter (Signed)
Could someone please advise as to status of this request? Thanks!

## 2018-03-28 ENCOUNTER — Other Ambulatory Visit: Payer: Self-pay | Admitting: Family Medicine

## 2018-03-28 DIAGNOSIS — I10 Essential (primary) hypertension: Secondary | ICD-10-CM

## 2018-04-17 ENCOUNTER — Ambulatory Visit (INDEPENDENT_AMBULATORY_CARE_PROVIDER_SITE_OTHER): Payer: BLUE CROSS/BLUE SHIELD

## 2018-04-17 DIAGNOSIS — Z23 Encounter for immunization: Secondary | ICD-10-CM

## 2018-05-16 ENCOUNTER — Encounter (INDEPENDENT_AMBULATORY_CARE_PROVIDER_SITE_OTHER): Payer: BLUE CROSS/BLUE SHIELD | Admitting: Ophthalmology

## 2018-05-16 DIAGNOSIS — E11319 Type 2 diabetes mellitus with unspecified diabetic retinopathy without macular edema: Secondary | ICD-10-CM

## 2018-05-16 DIAGNOSIS — H35372 Puckering of macula, left eye: Secondary | ICD-10-CM | POA: Diagnosis not present

## 2018-05-16 DIAGNOSIS — E113293 Type 2 diabetes mellitus with mild nonproliferative diabetic retinopathy without macular edema, bilateral: Secondary | ICD-10-CM

## 2018-05-16 DIAGNOSIS — H35033 Hypertensive retinopathy, bilateral: Secondary | ICD-10-CM

## 2018-05-16 DIAGNOSIS — I10 Essential (primary) hypertension: Secondary | ICD-10-CM | POA: Diagnosis not present

## 2018-05-16 DIAGNOSIS — H43813 Vitreous degeneration, bilateral: Secondary | ICD-10-CM

## 2018-05-16 DIAGNOSIS — H2513 Age-related nuclear cataract, bilateral: Secondary | ICD-10-CM

## 2018-05-28 DIAGNOSIS — E113212 Type 2 diabetes mellitus with mild nonproliferative diabetic retinopathy with macular edema, left eye: Secondary | ICD-10-CM | POA: Diagnosis not present

## 2018-05-28 DIAGNOSIS — E113291 Type 2 diabetes mellitus with mild nonproliferative diabetic retinopathy without macular edema, right eye: Secondary | ICD-10-CM | POA: Diagnosis not present

## 2018-05-28 DIAGNOSIS — H401132 Primary open-angle glaucoma, bilateral, moderate stage: Secondary | ICD-10-CM | POA: Diagnosis not present

## 2018-05-28 DIAGNOSIS — H524 Presbyopia: Secondary | ICD-10-CM | POA: Diagnosis not present

## 2018-05-29 ENCOUNTER — Encounter: Payer: Self-pay | Admitting: Family Medicine

## 2018-05-29 ENCOUNTER — Ambulatory Visit: Payer: BLUE CROSS/BLUE SHIELD | Admitting: Family Medicine

## 2018-05-29 ENCOUNTER — Other Ambulatory Visit: Payer: Self-pay | Admitting: Family Medicine

## 2018-05-29 VITALS — BP 118/70 | HR 66 | Temp 98.1°F | Ht 66.5 in | Wt 193.6 lb

## 2018-05-29 DIAGNOSIS — I10 Essential (primary) hypertension: Secondary | ICD-10-CM

## 2018-05-29 DIAGNOSIS — E785 Hyperlipidemia, unspecified: Secondary | ICD-10-CM | POA: Diagnosis not present

## 2018-05-29 DIAGNOSIS — E669 Obesity, unspecified: Secondary | ICD-10-CM

## 2018-05-29 DIAGNOSIS — E1122 Type 2 diabetes mellitus with diabetic chronic kidney disease: Secondary | ICD-10-CM | POA: Diagnosis not present

## 2018-05-29 DIAGNOSIS — E1169 Type 2 diabetes mellitus with other specified complication: Secondary | ICD-10-CM

## 2018-05-29 DIAGNOSIS — E1159 Type 2 diabetes mellitus with other circulatory complications: Secondary | ICD-10-CM | POA: Diagnosis not present

## 2018-05-29 LAB — BASIC METABOLIC PANEL
BUN: 24 mg/dL — AB (ref 6–23)
CHLORIDE: 104 meq/L (ref 96–112)
CO2: 26 mEq/L (ref 19–32)
CREATININE: 1.3 mg/dL (ref 0.40–1.50)
Calcium: 9.6 mg/dL (ref 8.4–10.5)
GFR: 56.17 mL/min — ABNORMAL LOW (ref >60.00)
Glucose, Bld: 185 mg/dL — ABNORMAL HIGH (ref 70–99)
POTASSIUM: 5.5 meq/L — AB (ref 3.5–5.1)
Sodium: 138 mEq/L (ref 135–145)

## 2018-05-29 LAB — CBC
HEMATOCRIT: 42 % (ref 39.0–52.0)
HEMOGLOBIN: 14.2 g/dL (ref 13.0–17.0)
MCHC: 33.8 g/dL (ref 30.0–36.0)
MCV: 90.2 fl (ref 78.0–100.0)
Platelets: 238 10*3/uL (ref 150.0–400.0)
RBC: 4.65 Mil/uL (ref 4.22–5.81)
RDW: 13.6 % (ref 11.5–15.5)
WBC: 10.3 10*3/uL (ref 4.0–10.5)

## 2018-05-29 LAB — HEMOGLOBIN A1C: HEMOGLOBIN A1C: 8 % — AB (ref 4.6–6.5)

## 2018-05-29 NOTE — Patient Instructions (Signed)
BEFORE YOU LEAVE: -labs -follow up: 3-4 months  We have ordered labs or studies at this visit. It can take up to 1-2 weeks for results and processing. IF results require follow up or explanation, we will call you with instructions. Clinically stable results will be released to your MYCHART. If you have not heard from us or cannot find your results in MYCHART in 2 weeks please contact our office at [phone].  If you are not yet signed up for MYCHART, please consider signing up.   We recommend the following healthy lifestyle for LIFE: 1) Small portions. But, make sure to get regular (at least 3 per day), healthy meals and small healthy snacks if needed.  2) Eat a healthy clean diet.   TRY TO EAT: -at least 5-7 servings of low sugar, colorful, and nutrient rich vegetables per day (not corn, potatoes or bananas.) -berries are the best choice if you wish to eat fruit (only eat small amounts if trying to reduce weight)  -lean meets (fish, white meat of chicken or turkey) -vegan proteins for some meals - beans or tofu, whole grains, nuts and seeds -Replace bad fats with good fats - good fats include: fish, nuts and seeds, canola oil, olive oil -small amounts of low fat or non fat dairy -small amounts of100 % whole grains - check the lables -drink plenty of water  AVOID: -SUGAR, sweets, anything with added sugar, corn syrup or sweeteners - must read labels as even foods advertised as "healthy" often are loaded with sugar -if you must have a sweetener, small amounts of stevia may be best -sweetened beverages and artificially sweetened beverages -simple starches (rice, bread, potatoes, pasta, chips, etc - small amounts of 100% whole grains are ok) -red meat, pork, butter -fried foods, fast food, processed food, excessive dairy, eggs and coconut.  3)Get at least 150 minutes of sweaty aerobic exercise per week.  4)Reduce stress - consider counseling, meditation and relaxation to balance  other aspects of your life.        

## 2018-05-29 NOTE — Progress Notes (Signed)
HPI:  Using dictation device. Unfortunately this device frequently misinterprets words/phrases.  Brandon Santos is a pleasant 84 y.o. here for follow up. Chronic medical problems summarized below were reviewed for changes and stability and were updated as needed below. These issues and their treatment remain stable for the most part. Reports eye exam done recently. Continues to exercise daily. feels diet is good. Denies CP, SOB, DOE, hypo/hyperglycemia, treatment intolerance or new symptoms. Due for foot exam,   HTNw/ diabetes: -denies hx heart dz, palpitations, significant or worsening LE edema or CHF -current meds: lisinopril-hctz 20-12.5 (2 tablets), metoprolol 25 bid, norvasc 5 mg   Diabetes w/CRI/HLD -conts statin -meds: asa, amaryl 1mg  daily, metfromin 1000mg  bid -fasting BS 90s and low 100s -eye examswith Dr. Zigmund Daniel  ROS: See pertinent positives and negatives per HPI.  Past Medical History:  Diagnosis Date  . BPH (benign prostatic hyperplasia)   . Colon polyp   . Diabetes mellitus without complication ([place])   . Hemorrhoid   . Hyperlipemia   . Hypertension   . Spinal stenosis     Past Surgical History:  Procedure Laterality Date  . APPENDECTOMY  1966  . COLONOSCOPY  2013   had it close to end of year, exam was normal  . Drain fluid from lungs  1938   states "I was 84 year old"  . LUMBAR WOUND DEBRIDEMENT N/A 02/24/2014   Procedure: Revision of Lumbar Wound;  Surgeon: Eustace Moore, MD;  Location: [place];  Service: Neurosurgery;  Laterality: N/A;  Revision of Lumbar Wound  . MAXIMUM ACCESS (MAS)POSTERIOR LUMBAR INTERBODY FUSION (PLIF) 1 LEVEL N/A 01/21/2014   Procedure: FOR MAXIMUM ACCESS (MAS) POSTERIOR LUMBAR INTERBODY FUSION (PLIF) LUMBAR FOUR FIVE;  Surgeon: Eustace Moore, MD;  Location: [place] [facility];  Service: Neurosurgery;  Laterality: N/A;    Family History  Problem Relation Age of Onset  . Colon cancer Neg Hx   . Heart disease Father     SOCIAL  HX: see hpi   Current Outpatient Medications:  .  amLODipine (NORVASC) 5 MG tablet, Take 1 tablet (5 mg total) by mouth daily., Disp: 90 tablet, Rfl: 0 .  glimepiride (AMARYL) 2 MG tablet, TAKE 1/2 TABLET BY MOUTH DAILY WITH BREAKFAST, Disp: 45 tablet, Rfl: 0 .  glucose blood (ONETOUCH VERIO) test strip, Use as instructed to check blood sugar once a day., Disp: 100 each, Rfl: 3 .  LATANOPROST OP, Apply 1 drop to eye. Each eye every morning, Disp: , Rfl:  .  lisinopril-hydrochlorothiazide (PRINZIDE,ZESTORETIC) 20-12.5 MG tablet, TAKE TWO TABLETS BY MOUTH DAILY, Disp: 180 tablet, Rfl: 0 .  metFORMIN (GLUCOPHAGE) 1000 MG tablet, TAKE ONE TABLET BY MOUTH TWICE A DAY WITH FOOD, Disp: 180 tablet, Rfl: 0 .  metoprolol tartrate (LOPRESSOR) 25 MG tablet, TAKE ONE TABLET BY MOUTH TWICE A DAY, Disp: 180 tablet, Rfl: 0 .  Multiple Vitamins-Minerals (CENTRUM PO), Take 1 tablet by mouth daily. , Disp: , Rfl:  .  ONETOUCH DELICA LANCETS 89Q MISC, Use as directed to check blood sugar once a day, Disp: 100 each, Rfl: 3 .  simvastatin (ZOCOR) 20 MG tablet, Take 1 tablet (20 mg total) by mouth every evening., Disp: 90 tablet, Rfl: 0 .  timolol (TIMOPTIC) 0.5 % ophthalmic solution, Place 1 drop into both eyes daily. , Disp: , Rfl:   EXAM:  Vitals:   05/29/18 0920  BP: 118/70  Pulse: 66  Temp: 98.1 F (36.7 C)    Body mass index is  30.78 kg/m.  GENERAL: vitals reviewed and listed above, alert, oriented, appears well hydrated and in no acute distress  HEENT: atraumatic, conjunttiva clear, no obvious abnormalities on inspection of external nose and ears  NECK: no obvious masses on inspection  LUNGS: clear to auscultation bilaterally, no wheezes, rales or rhonchi, good air movement  CV: HRRR, no peripheral edema  MS: moves all extremities without noticeable abnormality  PSYCH: pleasant and cooperative, no obvious depression or anxiety  ASSESSMENT AND PLAN:  Discussed the following assessment and  plan:  Hyperlipidemia associated with type 2 diabetes mellitus ([place])  Hypertension associated with diabetes ([place]) - Plan: Basic metabolic panel, CBC  Type 2 diabetes mellitus with chronic kidney disease, without long-term current use of insulin, unspecified CKD stage (H[facility]) - Plan: Hemoglobin A1c  Obesity (BMI 30.0-34.9)  -doing well per his report -foot exam done -labs per orders -healthy lifestyle advised -cont current meds -follow 3-4 months, sooner as needed   Patient Instructions  BEFORE YOU LEAVE: -labs -follow up: 3-4 months  We have ordered labs or studies at this visit. It can take up to 1-2 weeks for results and processing. IF results require follow up or explanation, we will call you with instructions. Clinically stable results will be released to your [facility]. If you have not heard from [place] or cannot find your results in [facility] in 2 weeks please contact our office at [phone].  If you are not yet signed up for [facility], please consider signing up.   We recommend the following healthy lifestyle for LIFE: 1) Small portions. But, make sure to get regular (at least 3 per day), healthy meals and small healthy snacks if needed.  2) Eat a healthy clean diet.   TRY TO EAT: -at least 5-7 servings of low sugar, colorful, and nutrient rich vegetables per day (not corn, potatoes or bananas.) -berries are the best choice if you wish to eat fruit (only eat small amounts if trying to reduce weight)  -lean meets (fish, white meat of chicken or [place]) -vegan proteins for some meals - beans or tofu, whole grains, nuts and seeds -Replace bad fats with good fats - good fats include: fish, nuts and seeds, canola oil, olive oil -small amounts of low fat or non fat dairy -small amounts of100 % whole grains - check the lables -drink plenty of water  AVOID: -SUGAR, sweets, anything with added sugar, corn syrup or sweeteners - must read labels as even foods advertised as "healthy"  often are loaded with sugar -if you must have a sweetener, small amounts of stevia may be best -sweetened beverages and artificially sweetened beverages -simple starches (rice, bread, potatoes, pasta, chips, etc - small amounts of 100% whole grains are ok) -red meat, pork, butter -fried foods, fast food, processed food, excessive dairy, eggs and coconut.  3)Get at least 150 minutes of sweaty aerobic exercise per week.  4)Reduce stress - consider counseling, meditation and relaxation to balance other aspects of your life.          Lucretia Kern, DO

## 2018-05-30 ENCOUNTER — Telehealth: Payer: Self-pay | Admitting: Family Medicine

## 2018-05-30 MED ORDER — GLIMEPIRIDE 2 MG PO TABS: 2.0000 mg | ORAL_TABLET | Freq: Every day | ORAL | 1 refills | Status: DC

## 2018-05-30 NOTE — Telephone Encounter (Signed)
Rx done. 

## 2018-05-30 NOTE — Telephone Encounter (Signed)
Copied from [place] [phone]. Topic: General - Other >> May 30, 2018  2:07 PM Judyann Munson wrote: Reason for CRM:  patient is calling to state the medication glimepiride (AMARYL) 2 MG tablet  was sent to the pharmacy today but he was advise by Dr. Colin Benton to take One tablet a day instead of 1/2 a day. He stated would like this to be resent to the pharmacy. Please advise

## 2018-07-02 ENCOUNTER — Encounter (INDEPENDENT_AMBULATORY_CARE_PROVIDER_SITE_OTHER): Payer: BLUE CROSS/BLUE SHIELD | Admitting: Ophthalmology

## 2018-07-02 DIAGNOSIS — H43813 Vitreous degeneration, bilateral: Secondary | ICD-10-CM

## 2018-07-02 DIAGNOSIS — E113291 Type 2 diabetes mellitus with mild nonproliferative diabetic retinopathy without macular edema, right eye: Secondary | ICD-10-CM

## 2018-07-02 DIAGNOSIS — E11311 Type 2 diabetes mellitus with unspecified diabetic retinopathy with macular edema: Secondary | ICD-10-CM | POA: Diagnosis not present

## 2018-07-02 DIAGNOSIS — E113212 Type 2 diabetes mellitus with mild nonproliferative diabetic retinopathy with macular edema, left eye: Secondary | ICD-10-CM | POA: Diagnosis not present

## 2018-07-02 DIAGNOSIS — I10 Essential (primary) hypertension: Secondary | ICD-10-CM

## 2018-07-02 DIAGNOSIS — H318 Other specified disorders of choroid: Secondary | ICD-10-CM

## 2018-07-02 DIAGNOSIS — H35033 Hypertensive retinopathy, bilateral: Secondary | ICD-10-CM

## 2018-07-02 DIAGNOSIS — H2513 Age-related nuclear cataract, bilateral: Secondary | ICD-10-CM

## 2018-07-09 ENCOUNTER — Encounter (INDEPENDENT_AMBULATORY_CARE_PROVIDER_SITE_OTHER): Payer: BLUE CROSS/BLUE SHIELD | Admitting: Ophthalmology

## 2018-07-09 DIAGNOSIS — H318 Other specified disorders of choroid: Secondary | ICD-10-CM

## 2018-08-06 ENCOUNTER — Encounter (INDEPENDENT_AMBULATORY_CARE_PROVIDER_SITE_OTHER): Payer: BLUE CROSS/BLUE SHIELD | Admitting: Ophthalmology

## 2018-08-06 DIAGNOSIS — H318 Other specified disorders of choroid: Secondary | ICD-10-CM

## 2018-09-03 ENCOUNTER — Encounter (INDEPENDENT_AMBULATORY_CARE_PROVIDER_SITE_OTHER): Payer: BLUE CROSS/BLUE SHIELD | Admitting: Ophthalmology

## 2018-09-03 ENCOUNTER — Other Ambulatory Visit: Payer: Self-pay

## 2018-09-03 DIAGNOSIS — H318 Other specified disorders of choroid: Secondary | ICD-10-CM

## 2018-09-18 ENCOUNTER — Telehealth: Payer: Self-pay | Admitting: *Deleted

## 2018-09-18 ENCOUNTER — Other Ambulatory Visit: Payer: Self-pay | Admitting: Family Medicine

## 2018-09-18 DIAGNOSIS — I10 Essential (primary) hypertension: Secondary | ICD-10-CM

## 2018-09-18 MED ORDER — AMLODIPINE BESYLATE 5 MG PO TABS: 5.0000 mg | ORAL_TABLET | Freq: Every day | ORAL | 1 refills | Status: DC

## 2018-09-18 MED ORDER — SIMVASTATIN 20 MG PO TABS: 20.0000 mg | ORAL_TABLET | Freq: Every evening | ORAL | 1 refills | Status: DC

## 2018-09-18 MED ORDER — METOPROLOL TARTRATE 25 MG PO TABS: 25.0000 mg | ORAL_TABLET | Freq: Two times a day (BID) | ORAL | 1 refills | Status: DC

## 2018-09-18 MED ORDER — GLIMEPIRIDE 2 MG PO TABS: 2.0000 mg | ORAL_TABLET | Freq: Every day | ORAL | 1 refills | Status: DC

## 2018-09-18 MED ORDER — METFORMIN HCL 1000 MG PO TABS: 1000.0000 mg | ORAL_TABLET | Freq: Two times a day (BID) | ORAL | 1 refills | Status: DC

## 2018-09-18 MED ORDER — LISINOPRIL-HYDROCHLOROTHIAZIDE 20-12.5 MG PO TABS: 2.0000 | ORAL_TABLET | Freq: Every day | ORAL | 1 refills | Status: DC

## 2018-09-18 NOTE — Telephone Encounter (Signed)
Copied from [place] [phone]. Topic: General - Other >> Sep 18, 2018  1:42 PM Yvette Rack wrote: Reason for CRM: Pt wife stated the pharmacy will be submitting a refill request for 6 medications and she would like to ask Dr. Maudie Mercury to approve a 90 day supply with 2 refills.

## 2018-09-18 NOTE — Telephone Encounter (Signed)
Rxs sent for a 90 day supply as the pt needs an appt.  I called the pt, informed his wife of this and she stated they have a new patient visit scheduled at [facility] in July.  Rx re-sent for a 90 day supply with 1 refill.

## 2018-11-05 ENCOUNTER — Encounter (INDEPENDENT_AMBULATORY_CARE_PROVIDER_SITE_OTHER): Payer: BLUE CROSS/BLUE SHIELD | Admitting: Ophthalmology

## 2018-11-05 ENCOUNTER — Other Ambulatory Visit: Payer: Self-pay

## 2018-11-05 DIAGNOSIS — H318 Other specified disorders of choroid: Secondary | ICD-10-CM

## 2018-11-05 DIAGNOSIS — H35033 Hypertensive retinopathy, bilateral: Secondary | ICD-10-CM

## 2018-11-05 DIAGNOSIS — H2513 Age-related nuclear cataract, bilateral: Secondary | ICD-10-CM

## 2018-11-05 DIAGNOSIS — E113293 Type 2 diabetes mellitus with mild nonproliferative diabetic retinopathy without macular edema, bilateral: Secondary | ICD-10-CM | POA: Diagnosis not present

## 2018-11-05 DIAGNOSIS — E11319 Type 2 diabetes mellitus with unspecified diabetic retinopathy without macular edema: Secondary | ICD-10-CM | POA: Diagnosis not present

## 2018-11-05 DIAGNOSIS — I10 Essential (primary) hypertension: Secondary | ICD-10-CM

## 2018-11-05 DIAGNOSIS — H43813 Vitreous degeneration, bilateral: Secondary | ICD-10-CM

## 2018-11-27 ENCOUNTER — Ambulatory Visit: Payer: BLUE CROSS/BLUE SHIELD | Admitting: Family Medicine

## 2019-01-14 ENCOUNTER — Other Ambulatory Visit: Payer: Self-pay

## 2019-01-14 ENCOUNTER — Encounter (INDEPENDENT_AMBULATORY_CARE_PROVIDER_SITE_OTHER): Payer: BLUE CROSS/BLUE SHIELD | Admitting: Ophthalmology

## 2019-01-14 DIAGNOSIS — E113212 Type 2 diabetes mellitus with mild nonproliferative diabetic retinopathy with macular edema, left eye: Secondary | ICD-10-CM | POA: Diagnosis not present

## 2019-01-14 DIAGNOSIS — I10 Essential (primary) hypertension: Secondary | ICD-10-CM

## 2019-01-14 DIAGNOSIS — H43813 Vitreous degeneration, bilateral: Secondary | ICD-10-CM

## 2019-01-14 DIAGNOSIS — H35372 Puckering of macula, left eye: Secondary | ICD-10-CM

## 2019-01-14 DIAGNOSIS — E11319 Type 2 diabetes mellitus with unspecified diabetic retinopathy without macular edema: Secondary | ICD-10-CM

## 2019-01-14 DIAGNOSIS — E113291 Type 2 diabetes mellitus with mild nonproliferative diabetic retinopathy without macular edema, right eye: Secondary | ICD-10-CM

## 2019-01-14 DIAGNOSIS — H2513 Age-related nuclear cataract, bilateral: Secondary | ICD-10-CM

## 2019-01-14 DIAGNOSIS — H35033 Hypertensive retinopathy, bilateral: Secondary | ICD-10-CM

## 2019-01-21 ENCOUNTER — Encounter: Payer: BLUE CROSS/BLUE SHIELD | Admitting: Family Medicine

## 2019-01-29 ENCOUNTER — Encounter: Payer: Self-pay | Admitting: Family Medicine

## 2019-01-29 ENCOUNTER — Ambulatory Visit (INDEPENDENT_AMBULATORY_CARE_PROVIDER_SITE_OTHER): Payer: BC Managed Care – PPO | Admitting: Family Medicine

## 2019-01-29 ENCOUNTER — Other Ambulatory Visit: Payer: Self-pay

## 2019-01-29 VITALS — BP 142/80 | HR 65 | Temp 98.5°F | Ht 66.5 in | Wt 188.2 lb

## 2019-01-29 DIAGNOSIS — E663 Overweight: Secondary | ICD-10-CM

## 2019-01-29 DIAGNOSIS — E1159 Type 2 diabetes mellitus with other circulatory complications: Secondary | ICD-10-CM

## 2019-01-29 DIAGNOSIS — E1169 Type 2 diabetes mellitus with other specified complication: Secondary | ICD-10-CM

## 2019-01-29 DIAGNOSIS — Z6829 Body mass index (BMI) 29.0-29.9, adult: Secondary | ICD-10-CM

## 2019-01-29 DIAGNOSIS — Z0001 Encounter for general adult medical examination with abnormal findings: Secondary | ICD-10-CM | POA: Diagnosis not present

## 2019-01-29 DIAGNOSIS — E785 Hyperlipidemia, unspecified: Secondary | ICD-10-CM | POA: Diagnosis not present

## 2019-01-29 DIAGNOSIS — E1122 Type 2 diabetes mellitus with diabetic chronic kidney disease: Secondary | ICD-10-CM

## 2019-01-29 DIAGNOSIS — E0590 Thyrotoxicosis, unspecified without thyrotoxic crisis or storm: Secondary | ICD-10-CM

## 2019-01-29 DIAGNOSIS — I10 Essential (primary) hypertension: Secondary | ICD-10-CM | POA: Diagnosis not present

## 2019-01-29 LAB — COMPREHENSIVE METABOLIC PANEL
ALT: 16 U/L (ref 0–53)
AST: 18 U/L (ref 0–37)
Albumin: 4.4 g/dL (ref 3.5–5.2)
Alkaline Phosphatase: 43 U/L (ref 39–117)
BUN: 25 mg/dL — ABNORMAL HIGH (ref 6–23)
CO2: 25 mEq/L (ref 19–32)
Calcium: 9.6 mg/dL (ref 8.4–10.5)
Chloride: 104 mEq/L (ref 96–112)
Creatinine, Ser: 1.15 mg/dL (ref 0.40–1.50)
GFR: 60.78 mL/min (ref >60.00)
Glucose, Bld: 126 mg/dL — ABNORMAL HIGH (ref 70–99)
Potassium: 4.4 mEq/L (ref 3.5–5.1)
Sodium: 138 mEq/L (ref 135–145)
Total Bilirubin: 0.6 mg/dL (ref 0.2–1.2)
Total Protein: 6.9 g/dL (ref 6.0–8.3)

## 2019-01-29 LAB — CBC
HCT: 41.6 % (ref 39.0–52.0)
Hemoglobin: 13.8 g/dL (ref 13.0–17.0)
MCHC: 33.3 g/dL (ref 30.0–36.0)
MCV: 92.1 fl (ref 78.0–100.0)
Platelets: 188 10*3/uL (ref 150.0–400.0)
RBC: 4.52 Mil/uL (ref 4.22–5.81)
RDW: 13.6 % (ref 11.5–15.5)
WBC: 10.3 10*3/uL (ref 4.0–10.5)

## 2019-01-29 LAB — HEMOGLOBIN A1C: Hgb A1c MFr Bld: 6 % (ref 4.6–6.5)

## 2019-01-29 LAB — LIPID PANEL
Cholesterol: 144 mg/dL (ref 0–200)
HDL: 41.1 mg/dL (ref >39.00)
LDL Cholesterol: 81 mg/dL (ref 0–99)
NonHDL: 102.9
Total CHOL/HDL Ratio: 4
Triglycerides: 109 mg/dL (ref 0.0–149.0)
VLDL: 21.8 mg/dL (ref 0.0–40.0)

## 2019-01-29 LAB — TSH: TSH: 7.61 u[IU]/mL — ABNORMAL HIGH (ref 0.35–4.50)

## 2019-01-29 MED ORDER — METFORMIN HCL ER 500 MG PO TB24: 1000.0000 mg | ORAL_TABLET | Freq: Two times a day (BID) | ORAL | 3 refills | Status: DC

## 2019-01-29 NOTE — Assessment & Plan Note (Signed)
At goal per JNC 8. Continue Norvasc 5 mg daily, metoprolol tartrate 25 mg twice daily, lisinopril-HCTZ 20-12.5 twice daily.  Check CBC, C met, and TSH.

## 2019-01-29 NOTE — Assessment & Plan Note (Signed)
Continue simvastatin 20 mg daily.  Check lipid panel.

## 2019-01-29 NOTE — Patient Instructions (Signed)
It was very nice to see you today!  Please make sure that you are getting 30 to 35 g of fiber in your diet daily.  Please try the new metformin to see if this helps with your diarrhea.  If your symptoms do not improve with this, please let me know.  It is okay for you to take Imodium as needed.  We will check blood work today.  Come back to see me in 6 months for your follow-up visit, or sooner as needed.  Take care, Dr Jerline Pain  Please try these tips to maintain a healthy lifestyle:   Eat at least 3 REAL meals and 1-2 snacks per day.  Aim for no more than 5 hours between eating.  If you eat breakfast, please do so within one hour of getting up.    Obtain twice as many fruits/vegetables as protein or carbohydrate foods for both lunch and dinner. (Half of each meal should be fruits/vegetables, one quarter protein, and one quarter starchy carbs)   Cut down on sweet beverages. This includes juice, soda, and sweet tea.    Exercise at least 150 minutes every week.    Preventive Care 27 Years and Older, Male Preventive care refers to lifestyle choices and visits with your health care provider that can promote health and wellness. This includes:  A yearly physical exam. This is also called an annual well check.  Regular dental and eye exams.  Immunizations.  Screening for certain conditions.  Healthy lifestyle choices, such as diet and exercise. What can I expect for my preventive care visit? Physical exam Your health care provider will check:  Height and weight. These may be used to calculate body mass index (BMI), which is a measurement that tells if you are at a healthy weight.  Heart rate and blood pressure.  Your skin for abnormal spots. Counseling Your health care provider may ask you questions about:  Alcohol, tobacco, and drug use.  Emotional well-being.  Home and relationship well-being.  Sexual activity.  Eating habits.  History of falls.  Memory and  ability to understand (cognition).  Work and work Statistician. What immunizations do I need?  Influenza (flu) vaccine  This is recommended every year. Tetanus, diphtheria, and pertussis (Tdap) vaccine  You may need a Td booster every 10 years. Varicella (chickenpox) vaccine  You may need this vaccine if you have not already been vaccinated. Zoster (shingles) vaccine  You may need this after age 30. Pneumococcal conjugate (PCV13) vaccine  One dose is recommended after age 69. Pneumococcal polysaccharide (PPSV23) vaccine  One dose is recommended after age 23. Measles, mumps, and rubella (MMR) vaccine  You may need at least one dose of MMR if you were born in 1957 or later. You may also need a second dose. Meningococcal conjugate (MenACWY) vaccine  You may need this if you have certain conditions. Hepatitis A vaccine  You may need this if you have certain conditions or if you travel or work in places where you may be exposed to hepatitis A. Hepatitis B vaccine  You may need this if you have certain conditions or if you travel or work in places where you may be exposed to hepatitis B. Haemophilus influenzae type b (Hib) vaccine  You may need this if you have certain conditions. You may receive vaccines as individual doses or as more than one vaccine together in one shot (combination vaccines). Talk with your health care provider about the risks and benefits of combination vaccines.  What tests do I need? Blood tests  Lipid and cholesterol levels. These may be checked every 5 years, or more frequently depending on your overall health.  Hepatitis C test.  Hepatitis B test. Screening  Lung cancer screening. You may have this screening every year starting at age 30 if you have a 30-pack-year history of smoking and currently smoke or have quit within the past 15 years.  Colorectal cancer screening. All adults should have this screening starting at age 38 and continuing until  age 52. Your health care provider may recommend screening at age 32 if you are at increased risk. You will have tests every 1-10 years, depending on your results and the type of screening test.  Prostate cancer screening. Recommendations will vary depending on your family history and other risks.  Diabetes screening. This is done by checking your blood sugar (glucose) after you have not eaten for a while (fasting). You may have this done every 1-3 years.  Abdominal aortic aneurysm (AAA) screening. You may need this if you are a current or former smoker.  Sexually transmitted disease (STD) testing. Follow these instructions at home: Eating and drinking  Eat a diet that includes fresh fruits and vegetables, whole grains, lean protein, and low-fat dairy products. Limit your intake of foods with high amounts of sugar, saturated fats, and salt.  Take vitamin and mineral supplements as recommended by your health care provider.  Do not drink alcohol if your health care provider tells you not to drink.  If you drink alcohol: ? Limit how much you have to 0-2 drinks a day. ? Be aware of how much alcohol is in your drink. In the [place] one drink equals one 12 oz bottle of beer (355 mL), one 5 oz glass of wine (148 mL), or one 1 oz glass of hard liquor (44 mL). Lifestyle  Take daily care of your teeth and gums.  Stay active. Exercise for at least 30 minutes on 5 or more days each week.  Do not use any products that contain nicotine or tobacco, such as cigarettes, e-cigarettes, and chewing tobacco. If you need help quitting, ask your health care provider.  If you are sexually active, practice safe sex. Use a condom or other form of protection to prevent STIs (sexually transmitted infections).  Talk with your health care provider about taking a low-dose aspirin or statin. What's next?  Visit your health care provider once a year for a well check visit.  Ask your health care provider how often  you should have your eyes and teeth checked.  Stay up to date on all vaccines. This information is not intended to replace advice given to you by your health care provider. Make sure you discuss any questions you have with your health care provider. Document Released: 07/08/2015 Document Revised: 06/05/2018 Document Reviewed: 06/05/2018 Elsevier Patient Education  2020 [employer].

## 2019-01-29 NOTE — Progress Notes (Signed)
Chief Complaint:  Brandon Santos is a 85 y.o. male who presents today for his annual comprehensive physical exam and to transfer care to this office.    Assessment/Plan:  Hypertension associated with diabetes ([place]) At goal per JNC 8. Continue Norvasc 5 mg daily, metoprolol tartrate 25 mg twice daily, lisinopril-HCTZ 20-12.5 twice daily.  Check CBC, C met, and TSH.  Diabetes Check A1c.  Continue Amaryl 2 mg daily.  Continue metformin 1000 mg twice daily.  Hyperlipidemia associated with type 2 diabetes mellitus (HCC) Continue simvastatin 20 mg daily.  Check lipid panel.  Diarrhea No red flags.  Likely due to low fiber diet and possibly metformin.  Will switch to extended release Metformin.  Also recommended plenty of fiber in her diet with intake for least 30 to 35 g daily.  Discussed reasons to return to care.  Follow-up as needed.  Body mass index is 29.93 kg/m. / Overweight BMI Metric Follow Up - 01/29/19 1003      BMI Metric Follow Up-Please document annually   BMI Metric Follow Up  Education provided       Preventative Healthcare: Due for flu vaccine later this year.   Patient Counseling(The following topics were reviewed and/or handout was given):  -Nutrition: Stressed importance of moderation in sodium/caffeine intake, saturated fat and cholesterol, caloric balance, sufficient intake of fresh fruits, vegetables, and fiber.  -Stressed the importance of regular exercise.   -Substance Abuse: Discussed cessation/primary prevention of tobacco, alcohol, or other drug use; driving or other dangerous activities under the influence; availability of treatment for abuse.   -Injury prevention: Discussed safety belts, safety helmets, smoke detector, smoking near bedding or upholstery.   -Sexuality: Discussed sexually transmitted diseases, partner selection, use of condoms, avoidance of unintended pregnancy and contraceptive alternatives.   -Dental health: Discussed importance of regular  tooth brushing, flossing, and dental visits.  -Health maintenance and immunizations reviewed. Please refer to Health maintenance section.  Return to care in 1 year for next preventative visit.     Subjective:  HPI:  He has no acute complaints today.   He has been having intermittent diarrhea for a couple of months.  Happens nearly every day.  States that his stool is very liquidy this happens mostly after eating.  He has not noticed any obvious aggravating or precipitating factors.  No recent antibiotics or other medication changes.  He tried taking a probiotic which did not make much of a difference.  No abdominal pain, melena, hematochezia, nausea, vomiting, or unintentional weight loss.  His stable, chronic medical conditions are outlined below:   # T2DM - On amaryl 61m daily, metformin 10042mtwice daily and tolerating well - Home sugars in the low 100s - ROS: No reported polyuria or polydipsia  # Essential Hypertension - On norvasc 37m58maily, metoprolol tartrate 237m64mice daily, and lisinopril-HCTZ 20-12.25 twice daily. Tolerating all well without side effects. - ROS: No reported chest pain or shortness of breath.  # Dyslipidemia - On simvastatin 20mg58mly and tolerating well.  - ROS: No reported mylagias  % Glaucoma - Follows with ophtho - Dr Wood Clydene LamingDr MatheBradd Canaryestyle Diet: Watches carbs. Tries to eat plenty of fruits and vegetables.  Exercise: 30 minutes indoor cycling daily.   Depression screen PHQ 2/9 01/29/2019  Decreased Interest 0  Down, Depressed, Hopeless 0  PHQ - 2 Score 0   Health Maintenance Due  Topic Date Due  . HEMOGLOBIN A1C  11/28/2018  . INFLUENZA VACCINE  01/24/2019  ROS: Per HPI, otherwise a complete review of systems was negative.   PMH:  The following were reviewed and entered/updated in epic: Past Medical History:  Diagnosis Date  . BPH (benign prostatic hyperplasia)   . Colon polyp   . Diabetes mellitus without complication  ([place])   . Hemorrhoid   . Hyperlipemia   . Hypertension   . Spinal stenosis    Patient Active Problem List   Diagnosis Date Noted  . Hyperlipidemia associated with type 2 diabetes mellitus ([place]) 11/28/2017  . Diabetes ([place]) 03/22/2007  . Hypertension associated with diabetes ([place]) 03/22/2007   Past Surgical History:  Procedure Laterality Date  . APPENDECTOMY  1966  . COLONOSCOPY  2013   had it close to end of year, exam was normal  . Drain fluid from lungs  1938   states "I was 85 year old"  . LUMBAR WOUND DEBRIDEMENT N/A 02/24/2014   Procedure: Revision of Lumbar Wound;  Surgeon: Eustace Moore, MD;  Location: [facility];  Service: Neurosurgery;  Laterality: N/A;  Revision of Lumbar Wound  . MAXIMUM ACCESS (MAS)POSTERIOR LUMBAR INTERBODY FUSION (PLIF) 1 LEVEL N/A 01/21/2014   Procedure: FOR MAXIMUM ACCESS (MAS) POSTERIOR LUMBAR INTERBODY FUSION (PLIF) LUMBAR FOUR FIVE;  Surgeon: Eustace Moore, MD;  Location: [facility];  Service: Neurosurgery;  Laterality: N/A;    Family History  Problem Relation Age of Onset  . Heart disease Father   . Colon cancer Neg Hx     Medications- reviewed and updated Current Outpatient Medications  Medication Sig Dispense Refill  . amLODipine (NORVASC) 5 MG tablet Take 1 tablet (5 mg total) by mouth daily. 90 tablet 1  . glimepiride (AMARYL) 2 MG tablet Take 1 tablet (2 mg total) by mouth daily with breakfast. 90 tablet 1  . glucose blood (ONETOUCH VERIO) test strip Use as instructed to check blood sugar once a day. 100 each 3  . LATANOPROST OP Apply 1 drop to eye. Each eye every morning    . lisinopril-hydrochlorothiazide (PRINZIDE,ZESTORETIC) 20-12.5 MG tablet Take 2 tablets by mouth daily. 180 tablet 1  . metoprolol tartrate (LOPRESSOR) 25 MG tablet Take 1 tablet (25 mg total) by mouth 2 (two) times daily. 180 tablet 1  . Multiple Vitamins-Minerals (CENTRUM PO) Take 1 tablet by mouth daily.     Glory Rosebush DELICA LANCETS 32T MISC Use as directed to  check blood sugar once a day 100 each 3  . simvastatin (ZOCOR) 20 MG tablet Take 1 tablet (20 mg total) by mouth every evening. 90 tablet 1  . timolol (TIMOPTIC) 0.5 % ophthalmic solution Place 1 drop into both eyes daily.     . metFORMIN (GLUCOPHAGE-XR) 500 MG 24 hr tablet Take 2 tablets (1,000 mg total) by mouth 2 (two) times daily. 360 tablet 3   No current facility-administered medications for this visit.     Allergies-reviewed and updated No Known Allergies  Social History   Socioeconomic History  . Marital status: Married    Spouse name: Not on file  . Number of children: Not on file  . Years of education: Not on file  . Highest education level: Not on file  Occupational History  . Not on file  Social Needs  . Financial resource strain: Not on file  . Food insecurity    Worry: Not on file    Inability: Not on file  . Transportation needs    Medical: Not on file    Non-medical: Not on file  Tobacco Use  . Smoking status: Never Smoker  . Smokeless tobacco: Never Used  Substance and Sexual Activity  . Alcohol use: No    Comment: only drinks at Party/Holidays  . Drug use: No  . Sexual activity: Not on file  Lifestyle  . Physical activity    Days per week: Not on file    Minutes per session: Not on file  . Stress: Not on file  Relationships  . Social Herbalist on phone: Not on file    Gets together: Not on file    Attends religious service: Not on file    Active member of club or organization: Not on file    Attends meetings of clubs or organizations: Not on file    Relationship status: Not on file  Other Topics Concern  . Not on file  Social History Narrative   Work or School: retired Data processing manager Situation: lives with wife      Spiritual Beliefs: none; believes in God      Lifestyle: not regular exercise; diet is good                 Objective:  Physical Exam: BP (!) 142/80   Pulse 65   Temp 98.5 F (36.9 C)  (Oral)   Ht 5' 6.5" (1.689 m)   Wt 188 lb 4 oz (85.4 kg)   SpO2 97%   BMI 29.93 kg/m   Body mass index is 29.93 kg/m. Wt Readings from Last 3 Encounters:  01/29/19 188 lb 4 oz (85.4 kg)  05/29/18 193 lb 9.6 oz (87.8 kg)  11/28/17 190 lb 1.6 oz (86.2 kg)   Gen: NAD, resting comfortably HEENT: TMs normal bilaterally. OP clear. No thyromegaly noted.  CV: RRR with no murmurs appreciated Pulm: NWOB, CTAB with no crackles, wheezes, or rhonchi GI: Normal bowel sounds present. Soft, Nontender, Nondistended. MSK: no edema, cyanosis, or clubbing noted Skin: warm, dry Neuro: CN2-12 grossly intact. Strength 5/5 in upper and lower extremities. Reflexes symmetric and intact bilaterally.  Psych: Normal affect and thought content      M. Jerline Pain, MD 01/29/2019 10:04 AM

## 2019-01-29 NOTE — Assessment & Plan Note (Signed)
Check A1c.  Continue Amaryl 2 mg daily.  Continue metformin 1000 mg twice daily.

## 2019-01-30 NOTE — Addendum Note (Signed)
Addended by: Gwenyth Ober R on: 01/30/2019 04:51 PM   Modules accepted: Orders

## 2019-01-30 NOTE — Progress Notes (Signed)
Please inform patient of the following:  His thyroid level is off a little bit - please ask him to come back soon to recheck. Please place future orders for TSH and free t4.   His blood sugar is great - I am ok with him stopping his amaryl. We should recheck again in 6 months.   All of his other labs are NORMAL.   Brandon Santos. Jerline Pain, MD 01/30/2019 11:55 AM

## 2019-02-04 DIAGNOSIS — E113291 Type 2 diabetes mellitus with mild nonproliferative diabetic retinopathy without macular edema, right eye: Secondary | ICD-10-CM | POA: Diagnosis not present

## 2019-02-04 DIAGNOSIS — E113212 Type 2 diabetes mellitus with mild nonproliferative diabetic retinopathy with macular edema, left eye: Secondary | ICD-10-CM | POA: Diagnosis not present

## 2019-02-04 DIAGNOSIS — H401132 Primary open-angle glaucoma, bilateral, moderate stage: Secondary | ICD-10-CM | POA: Diagnosis not present

## 2019-04-23 ENCOUNTER — Encounter (INDEPENDENT_AMBULATORY_CARE_PROVIDER_SITE_OTHER): Payer: BC Managed Care – PPO | Admitting: Ophthalmology

## 2019-04-23 DIAGNOSIS — E113293 Type 2 diabetes mellitus with mild nonproliferative diabetic retinopathy without macular edema, bilateral: Secondary | ICD-10-CM | POA: Diagnosis not present

## 2019-04-23 DIAGNOSIS — H35033 Hypertensive retinopathy, bilateral: Secondary | ICD-10-CM

## 2019-04-23 DIAGNOSIS — E11319 Type 2 diabetes mellitus with unspecified diabetic retinopathy without macular edema: Secondary | ICD-10-CM

## 2019-04-23 DIAGNOSIS — H318 Other specified disorders of choroid: Secondary | ICD-10-CM | POA: Diagnosis not present

## 2019-04-23 DIAGNOSIS — I10 Essential (primary) hypertension: Secondary | ICD-10-CM

## 2019-04-23 DIAGNOSIS — H2513 Age-related nuclear cataract, bilateral: Secondary | ICD-10-CM

## 2019-04-23 DIAGNOSIS — H43813 Vitreous degeneration, bilateral: Secondary | ICD-10-CM

## 2019-05-26 ENCOUNTER — Other Ambulatory Visit: Payer: Self-pay | Admitting: Family Medicine

## 2019-05-26 DIAGNOSIS — I10 Essential (primary) hypertension: Secondary | ICD-10-CM

## 2019-05-27 NOTE — Telephone Encounter (Signed)
Last OV 01/29/19 Last refill(s) 09/18/18 90 day supply Next OV not scheduled

## 2019-07-29 ENCOUNTER — Encounter (INDEPENDENT_AMBULATORY_CARE_PROVIDER_SITE_OTHER): Payer: BC Managed Care – PPO | Admitting: Ophthalmology

## 2019-07-29 DIAGNOSIS — H318 Other specified disorders of choroid: Secondary | ICD-10-CM | POA: Diagnosis not present

## 2019-07-29 DIAGNOSIS — E113291 Type 2 diabetes mellitus with mild nonproliferative diabetic retinopathy without macular edema, right eye: Secondary | ICD-10-CM | POA: Diagnosis not present

## 2019-07-29 DIAGNOSIS — E11311 Type 2 diabetes mellitus with unspecified diabetic retinopathy with macular edema: Secondary | ICD-10-CM | POA: Diagnosis not present

## 2019-07-29 DIAGNOSIS — H43813 Vitreous degeneration, bilateral: Secondary | ICD-10-CM

## 2019-07-29 DIAGNOSIS — E113312 Type 2 diabetes mellitus with moderate nonproliferative diabetic retinopathy with macular edema, left eye: Secondary | ICD-10-CM | POA: Diagnosis not present

## 2019-07-29 DIAGNOSIS — H2513 Age-related nuclear cataract, bilateral: Secondary | ICD-10-CM

## 2019-07-29 DIAGNOSIS — H35033 Hypertensive retinopathy, bilateral: Secondary | ICD-10-CM

## 2019-07-29 DIAGNOSIS — I10 Essential (primary) hypertension: Secondary | ICD-10-CM

## 2019-08-10 ENCOUNTER — Ambulatory Visit (INDEPENDENT_AMBULATORY_CARE_PROVIDER_SITE_OTHER): Payer: BC Managed Care – PPO | Admitting: Family Medicine

## 2019-08-10 ENCOUNTER — Encounter: Payer: Self-pay | Admitting: Family Medicine

## 2019-08-10 ENCOUNTER — Other Ambulatory Visit: Payer: Self-pay

## 2019-08-10 VITALS — BP 134/78 | HR 64 | Temp 97.2°F | Ht 66.5 in | Wt 188.2 lb

## 2019-08-10 DIAGNOSIS — E1122 Type 2 diabetes mellitus with diabetic chronic kidney disease: Secondary | ICD-10-CM

## 2019-08-10 DIAGNOSIS — E1159 Type 2 diabetes mellitus with other circulatory complications: Secondary | ICD-10-CM | POA: Diagnosis not present

## 2019-08-10 DIAGNOSIS — I10 Essential (primary) hypertension: Secondary | ICD-10-CM | POA: Diagnosis not present

## 2019-08-10 LAB — POCT GLYCOSYLATED HEMOGLOBIN (HGB A1C): Hemoglobin A1C: 5.9 % — AB (ref 4.0–5.6)

## 2019-08-10 MED ORDER — SIMVASTATIN 20 MG PO TABS: 20.0000 mg | ORAL_TABLET | Freq: Every evening | ORAL | 3 refills | Status: AC

## 2019-08-10 MED ORDER — METFORMIN HCL 1000 MG PO TABS: 1000.0000 mg | ORAL_TABLET | Freq: Two times a day (BID) | ORAL | 3 refills | Status: AC

## 2019-08-10 MED ORDER — METOPROLOL TARTRATE 25 MG PO TABS: 25.0000 mg | ORAL_TABLET | Freq: Two times a day (BID) | ORAL | 3 refills | Status: AC

## 2019-08-10 MED ORDER — LISINOPRIL-HYDROCHLOROTHIAZIDE 20-12.5 MG PO TABS: 2.0000 | ORAL_TABLET | Freq: Every day | ORAL | 3 refills | Status: AC

## 2019-08-10 MED ORDER — AMLODIPINE BESYLATE 5 MG PO TABS: 5.0000 mg | ORAL_TABLET | Freq: Every day | ORAL | 3 refills | Status: AC

## 2019-08-10 NOTE — Assessment & Plan Note (Signed)
A1c improved to 5.9.  Will stop Amaryl.  We will continue Metformin 1000 mg twice daily.  Will send in previous in the release formulation per patient request.  Check A1c again in 6 months.

## 2019-08-10 NOTE — Assessment & Plan Note (Signed)
At goal.  Continue Norvasc 5 mg daily, metoprolol tartrate 25 mg twice daily, lisinopril-HCTZ 20-12.5 twice daily.

## 2019-08-10 NOTE — Progress Notes (Signed)
   Brandon Santos is a 86 y.o. male who presents today for an office visit.  Assessment/Plan:  Chronic Problems Addressed Today: Hypertension associated with diabetes ([place]) At goal.  Continue Norvasc 5 mg daily, metoprolol tartrate 25 mg twice daily, lisinopril-HCTZ 20-12.5 twice daily.  Diabetes A1c improved to 5.9.  Will stop Amaryl.  We will continue Metformin 1000 mg twice daily.  Will send in previous in the release formulation per patient request.  Check A1c again in 6 months.    Subjective:  HPI:  Diarrhea has improved.  He would like to go to overdose of Metformin.  No other significant changes since his last visit 6 months ago.       Objective:  Physical Exam: BP 134/78   Pulse 64   Temp (!) 97.2 F (36.2 C)   Ht 5' 6.5" (1.689 m)   Wt 188 lb 4 oz (85.4 kg)   SpO2 98%   BMI 29.93 kg/m   Wt Readings from Last 3 Encounters:  08/10/19 188 lb 4 oz (85.4 kg)  01/29/19 188 lb 4 oz (85.4 kg)  05/29/18 193 lb 9.6 oz (87.8 kg)  Gen: No acute distress, resting comfortably CV: Regular rate and rhythm with no murmurs appreciated Pulm: Normal work of breathing, clear to auscultation bilaterally with no crackles, wheezes, or rhonchi Neuro: Grossly normal, moves all extremities Psych: Normal affect and thought content      Brandon Santos M. Jerline Pain, MD 08/10/2019 8:26 AM

## 2019-08-10 NOTE — Patient Instructions (Signed)
It was very nice to see you today!  STOP the glimepiride.  I will refill your other medications and send in your previous form of Metformin.  Come back in 6 months for your annual checkup with blood work, or sooner if needed.  Take care, Dr Jerline Pain  Please try these tips to maintain a healthy lifestyle:   Eat at least 3 REAL meals and 1-2 snacks per day.  Aim for no more than 5 hours between eating.  If you eat breakfast, please do so within one hour of getting up.    Each meal should contain half fruits/vegetables, one quarter protein, and one quarter carbs (no bigger than a computer mouse)   Cut down on sweet beverages. This includes juice, soda, and sweet tea.     Drink at least 1 glass of water with each meal and aim for at least 8 glasses per day   Exercise at least 150 minutes every week.

## 2019-08-22 ENCOUNTER — Other Ambulatory Visit: Payer: Self-pay | Admitting: Family Medicine

## 2019-09-09 DIAGNOSIS — H5203 Hypermetropia, bilateral: Secondary | ICD-10-CM | POA: Diagnosis not present

## 2019-09-09 DIAGNOSIS — H401132 Primary open-angle glaucoma, bilateral, moderate stage: Secondary | ICD-10-CM | POA: Diagnosis not present

## 2019-09-09 DIAGNOSIS — H35033 Hypertensive retinopathy, bilateral: Secondary | ICD-10-CM | POA: Diagnosis not present

## 2019-09-09 DIAGNOSIS — E113212 Type 2 diabetes mellitus with mild nonproliferative diabetic retinopathy with macular edema, left eye: Secondary | ICD-10-CM | POA: Diagnosis not present

## 2019-09-09 DIAGNOSIS — H52223 Regular astigmatism, bilateral: Secondary | ICD-10-CM | POA: Diagnosis not present

## 2019-09-09 DIAGNOSIS — H524 Presbyopia: Secondary | ICD-10-CM | POA: Diagnosis not present

## 2019-09-09 DIAGNOSIS — E113291 Type 2 diabetes mellitus with mild nonproliferative diabetic retinopathy without macular edema, right eye: Secondary | ICD-10-CM | POA: Diagnosis not present

## 2019-10-20 DIAGNOSIS — H35033 Hypertensive retinopathy, bilateral: Secondary | ICD-10-CM | POA: Diagnosis not present

## 2019-10-20 DIAGNOSIS — E113212 Type 2 diabetes mellitus with mild nonproliferative diabetic retinopathy with macular edema, left eye: Secondary | ICD-10-CM | POA: Diagnosis not present

## 2019-10-20 DIAGNOSIS — H401132 Primary open-angle glaucoma, bilateral, moderate stage: Secondary | ICD-10-CM | POA: Diagnosis not present

## 2019-10-28 ENCOUNTER — Encounter (INDEPENDENT_AMBULATORY_CARE_PROVIDER_SITE_OTHER): Payer: BC Managed Care – PPO | Admitting: Ophthalmology

## 2019-10-28 DIAGNOSIS — E113293 Type 2 diabetes mellitus with mild nonproliferative diabetic retinopathy without macular edema, bilateral: Secondary | ICD-10-CM

## 2019-10-28 DIAGNOSIS — H43813 Vitreous degeneration, bilateral: Secondary | ICD-10-CM

## 2019-10-28 DIAGNOSIS — H35033 Hypertensive retinopathy, bilateral: Secondary | ICD-10-CM

## 2019-10-28 DIAGNOSIS — I10 Essential (primary) hypertension: Secondary | ICD-10-CM

## 2019-10-28 DIAGNOSIS — E11319 Type 2 diabetes mellitus with unspecified diabetic retinopathy without macular edema: Secondary | ICD-10-CM

## 2019-10-28 DIAGNOSIS — H35372 Puckering of macula, left eye: Secondary | ICD-10-CM

## 2019-10-28 DIAGNOSIS — H318 Other specified disorders of choroid: Secondary | ICD-10-CM

## 2020-01-28 ENCOUNTER — Encounter: Payer: BC Managed Care – PPO | Admitting: Family Medicine

## 2020-01-29 ENCOUNTER — Other Ambulatory Visit: Payer: Self-pay | Admitting: Family Medicine

## 2020-02-04 ENCOUNTER — Ambulatory Visit (INDEPENDENT_AMBULATORY_CARE_PROVIDER_SITE_OTHER): Payer: BC Managed Care – PPO | Admitting: Family Medicine

## 2020-02-04 ENCOUNTER — Encounter: Payer: Self-pay | Admitting: Family Medicine

## 2020-02-04 ENCOUNTER — Other Ambulatory Visit: Payer: Self-pay

## 2020-02-04 VITALS — BP 134/76 | HR 64 | Temp 97.8°F | Ht 66.5 in | Wt 186.2 lb

## 2020-02-04 DIAGNOSIS — E1159 Type 2 diabetes mellitus with other circulatory complications: Secondary | ICD-10-CM

## 2020-02-04 DIAGNOSIS — I10 Essential (primary) hypertension: Secondary | ICD-10-CM

## 2020-02-04 DIAGNOSIS — E1122 Type 2 diabetes mellitus with diabetic chronic kidney disease: Secondary | ICD-10-CM

## 2020-02-04 DIAGNOSIS — E1169 Type 2 diabetes mellitus with other specified complication: Secondary | ICD-10-CM | POA: Diagnosis not present

## 2020-02-04 DIAGNOSIS — Z0001 Encounter for general adult medical examination with abnormal findings: Secondary | ICD-10-CM

## 2020-02-04 DIAGNOSIS — H409 Unspecified glaucoma: Secondary | ICD-10-CM | POA: Insufficient documentation

## 2020-02-04 DIAGNOSIS — E785 Hyperlipidemia, unspecified: Secondary | ICD-10-CM

## 2020-02-04 NOTE — Progress Notes (Signed)
Chief Complaint:  Brandon Santos is a 86 y.o. male who presents today for his annual comprehensive physical exam.    Assessment/Plan:  Chronic Problems Addressed Today: Hypertension associated with diabetes ([place]) At goal.  Continue Norvasc 5 mg daily, metoprolol tartrate 25 mg twice daily, lisinopril HCTZ 20-12.5 twice daily.  Check CBC, CMET, TSH.  Diabetes Check A1c.  Doing well off Amaryl.  Continue Metformin 1000 mg twice daily.  Glaucoma Continue management per ophthalmology.  Hyperlipidemia associated with type 2 diabetes mellitus (HCC) Continue simvastatin 20 mg daily.  Check lipid panel.  Body mass index is 29.6 kg/m. / Obese  BMI Metric Follow Up - 02/04/20 1005      BMI Metric Follow Up-Please document annually   BMI Metric Follow Up Education provided            Preventative Healthcare: Check CBC, CMET, TSH, lipid panel.  Discussed Covid vaccine.  Patient Counseling(The following topics were reviewed and/or handout was given):  -Nutrition: Stressed importance of moderation in sodium/caffeine intake, saturated fat and cholesterol, caloric balance, sufficient intake of fresh fruits, vegetables, and fiber.  -Stressed the importance of regular exercise.   -Substance Abuse: Discussed cessation/primary prevention of tobacco, alcohol, or other drug use; driving or other dangerous activities under the influence; availability of treatment for abuse.   -Injury prevention: Discussed safety belts, safety helmets, smoke detector, smoking near bedding or upholstery.   -Sexuality: Discussed sexually transmitted diseases, partner selection, use of condoms, avoidance of unintended pregnancy and contraceptive alternatives.   -Dental health: Discussed importance of regular tooth brushing, flossing, and dental visits.  -Health maintenance and immunizations reviewed. Please refer to Health maintenance section.  Return to care in 1 year for next preventative visit.     Subjective:   HPI:  He has no acute complaints today.   Lifestyle Diet: Balanced. Plenty of fruits and vegetables.  Exercise: Likes cycling.   Depression screen PHQ 2/9 02/04/2020  Decreased Interest 0  Down, Depressed, Hopeless 0  PHQ - 2 Score 0    There are no preventive care reminders to display for this patient.   ROS: Per HPI, otherwise a complete review of systems was negative.   PMH:  The following were reviewed and entered/updated in epic: Past Medical History:  Diagnosis Date  . BPH (benign prostatic hyperplasia)   . Colon polyp   . Diabetes mellitus without complication ([place])   . Hemorrhoid   . Hyperlipemia   . Hypertension   . Spinal stenosis    Patient Active Problem List   Diagnosis Date Noted  . Glaucoma 02/04/2020  . Hyperlipidemia associated with type 2 diabetes mellitus ([place]) 11/28/2017  . Diabetes ([place]) 03/22/2007  . Hypertension associated with diabetes ([place]) 03/22/2007   Past Surgical History:  Procedure Laterality Date  . APPENDECTOMY  1966  . COLONOSCOPY  2013   had it close to end of year, exam was normal  . Drain fluid from lungs  1938   states "I was 86 year old"  . LUMBAR WOUND DEBRIDEMENT N/A 02/24/2014   Procedure: Revision of Lumbar Wound;  Surgeon: Eustace Moore, MD;  Location: [facility];  Service: Neurosurgery;  Laterality: N/A;  Revision of Lumbar Wound  . MAXIMUM ACCESS (MAS)POSTERIOR LUMBAR INTERBODY FUSION (PLIF) 1 LEVEL N/A 01/21/2014   Procedure: FOR MAXIMUM ACCESS (MAS) POSTERIOR LUMBAR INTERBODY FUSION (PLIF) LUMBAR FOUR FIVE;  Surgeon: Eustace Moore, MD;  Location: [facility];  Service: Neurosurgery;  Laterality: N/A;  Family History  Problem Relation Age of Onset  . Heart disease Father   . Colon cancer Neg Hx     Medications- reviewed and updated Current Outpatient Medications  Medication Sig Dispense Refill  . amLODipine (NORVASC) 5 MG tablet Take 1 tablet (5 mg total) by mouth daily. 90 tablet 3  . glucose blood (ONETOUCH  VERIO) test strip Use as instructed to check blood sugar once a day. 100 each 3  . lisinopril-hydrochlorothiazide (ZESTORETIC) 20-12.5 MG tablet Take 2 tablets by mouth daily. 180 tablet 3  . metFORMIN (GLUCOPHAGE) 1000 MG tablet Take 1 tablet (1,000 mg total) by mouth 2 (two) times daily with a meal. 180 tablet 3  . metoprolol tartrate (LOPRESSOR) 25 MG tablet Take 1 tablet (25 mg total) by mouth 2 (two) times daily. 180 tablet 3  . Multiple Vitamins-Minerals (CENTRUM PO) Take 1 tablet by mouth daily.     Glory Rosebush DELICA LANCETS 35K MISC Use as directed to check blood sugar once a day 100 each 3  . ROCKLATAN 0.02-0.005 % SOLN Apply 1 drop to eye at bedtime.    . simvastatin (ZOCOR) 20 MG tablet Take 1 tablet (20 mg total) by mouth every evening. 90 tablet 3  . timolol (TIMOPTIC) 0.5 % ophthalmic solution Place 1 drop into both eyes daily.      No current facility-administered medications for this visit.    Allergies-reviewed and updated No Known Allergies  Social History   Socioeconomic History  . Marital status: Married    Spouse name: Not on file  . Number of children: Not on file  . Years of education: Not on file  . Highest education level: Not on file  Occupational History  . Not on file  Tobacco Use  . Smoking status: Never Smoker  . Smokeless tobacco: Never Used  Substance and Sexual Activity  . Alcohol use: No    Comment: only drinks at Party/Holidays  . Drug use: No  . Sexual activity: Not on file  Other Topics Concern  . 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 Difficulty of Paying Living Expenses:   Food Insecurity:   . Worried About Charity fundraiser in the Last Year:   . Arboriculturist in the  Last Year:   Transportation Needs:   . Film/video editor (Medical):   Marland Kitchen Lack of Transportation (Non-Medical):   Physical Activity:   . Days of Exercise per Week:   . Minutes of Exercise per Session:   Stress:   . Feeling of Stress :   Social Connections:   . Frequency of Communication with Friends and Family:   . Frequency of Social Gatherings with Friends and Family:   . Attends Religious Services:   . Active Member of Clubs or Organizations:   . Attends Archivist Meetings:   Marland Kitchen Marital Status:         Objective:  Physical Exam: BP 134/76   Pulse 64   Temp 97.8 F (36.6 C)   Ht 5' 6.5" (1.689 m)   Wt 186 lb 3.2 oz (84.5 kg)  SpO2 98%   BMI 29.60 kg/m   Body mass index is 29.6 kg/m. Wt Readings from Last 3 Encounters:  02/04/20 186 lb 3.2 oz (84.5 kg)  08/10/19 188 lb 4 oz (85.4 kg)  01/29/19 188 lb 4 oz (85.4 kg)   Gen: NAD, resting comfortably HEENT: TMs normal bilaterally. OP clear. No thyromegaly noted.  CV: RRR with no murmurs appreciated Pulm: NWOB, CTAB with no crackles, wheezes, or rhonchi GI: Normal bowel sounds present. Soft, Nontender, Nondistended. MSK: no edema, cyanosis, or clubbing noted Skin: warm, dry Neuro: CN2-12 grossly intact. Strength 5/5 in upper and lower extremities. Reflexes symmetric and intact bilaterally.  Psych: Normal affect and thought content     Danilynn Jemison M. Jerline Pain, MD 02/04/2020 10:06 AM

## 2020-02-04 NOTE — Assessment & Plan Note (Signed)
At goal.  Continue Norvasc 5 mg daily, metoprolol tartrate 25 mg twice daily, lisinopril HCTZ 20-12.5 twice daily.  Check CBC, CMET, TSH.

## 2020-02-04 NOTE — Assessment & Plan Note (Signed)
Continue management per ophthalmology. 

## 2020-02-04 NOTE — Assessment & Plan Note (Signed)
Continue simvastatin 20 mg daily.  Check lipid panel.

## 2020-02-04 NOTE — Assessment & Plan Note (Signed)
Check A1c.  Doing well off Amaryl.  Continue Metformin 1000 mg twice daily.

## 2020-02-04 NOTE — Patient Instructions (Addendum)
It was very nice to see you today!  We will check labs today.  No medication changes.  I will see you back in 6 months.  Please come and see me sooner if needed.  Take care,i Dr Jerline Pain  Please try these tips to maintain a healthy lifestyle:   Eat at least 3 REAL meals and 1-2 snacks per day.  Aim for no more than 5 hours between eating.  If you eat breakfast, please do so within one hour of getting up.    Each meal should contain half fruits/vegetables, one quarter protein, and one quarter carbs (no bigger than a computer mouse)   Cut down on sweet beverages. This includes juice, soda, and sweet tea.     Drink at least 1 glass of water with each meal and aim for at least 8 glasses per day   Exercise at least 150 minutes every week.    Preventive Care 46 Years and Older, Male Preventive care refers to lifestyle choices and visits with your health care provider that can promote health and wellness. This includes:  A yearly physical exam. This is also called an annual well check.  Regular dental and eye exams.  Immunizations.  Screening for certain conditions.  Healthy lifestyle choices, such as diet and exercise. What can I expect for my preventive care visit? Physical exam Your health care provider will check:  Height and weight. These may be used to calculate body mass index (BMI), which is a measurement that tells if you are at a healthy weight.  Heart rate and blood pressure.  Your skin for abnormal spots. Counseling Your health care provider may ask you questions about:  Alcohol, tobacco, and drug use.  Emotional well-being.  Home and relationship well-being.  Sexual activity.  Eating habits.  History of falls.  Memory and ability to understand (cognition).  Work and work Statistician. What immunizations do I need?  Influenza (flu) vaccine  This is recommended every year. Tetanus, diphtheria, and pertussis (Tdap) vaccine  You may need a  Td booster every 10 years. Varicella (chickenpox) vaccine  You may need this vaccine if you have not already been vaccinated. Zoster (shingles) vaccine  You may need this after age 10. Pneumococcal conjugate (PCV13) vaccine  One dose is recommended after age 48. Pneumococcal polysaccharide (PPSV23) vaccine  One dose is recommended after age 30. Measles, mumps, and rubella (MMR) vaccine  You may need at least one dose of MMR if you were born in 1957 or later. You may also need a second dose. Meningococcal conjugate (MenACWY) vaccine  You may need this if you have certain conditions. Hepatitis A vaccine  You may need this if you have certain conditions or if you travel or work in places where you may be exposed to hepatitis A. Hepatitis B vaccine  You may need this if you have certain conditions or if you travel or work in places where you may be exposed to hepatitis B. Haemophilus influenzae type b (Hib) vaccine  You may need this if you have certain conditions. You may receive vaccines as individual doses or as more than one vaccine together in one shot (combination vaccines). Talk with your health care provider about the risks and benefits of combination vaccines. What tests do I need? Blood tests  Lipid and cholesterol levels. These may be checked every 5 years, or more frequently depending on your overall health.  Hepatitis C test.  Hepatitis B test. Screening  Lung cancer screening. You  may have this screening every year starting at age 68 if you have a 30-pack-year history of smoking and currently smoke or have quit within the past 15 years.  Colorectal cancer screening. All adults should have this screening starting at age 31 and continuing until age 78. Your health care provider may recommend screening at age 73 if you are at increased risk. You will have tests every 1-10 years, depending on your results and the type of screening test.  Prostate cancer screening.  Recommendations will vary depending on your family history and other risks.  Diabetes screening. This is done by checking your blood sugar (glucose) after you have not eaten for a while (fasting). You may have this done every 1-3 years.  Abdominal aortic aneurysm (AAA) screening. You may need this if you are a current or former smoker.  Sexually transmitted disease (STD) testing. Follow these instructions at home: Eating and drinking  Eat a diet that includes fresh fruits and vegetables, whole grains, lean protein, and low-fat dairy products. Limit your intake of foods with high amounts of sugar, saturated fats, and salt.  Take vitamin and mineral supplements as recommended by your health care provider.  Do not drink alcohol if your health care provider tells you not to drink.  If you drink alcohol: ? Limit how much you have to 0-2 drinks a day. ? Be aware of how much alcohol is in your drink. In the [place] one drink equals one 12 oz bottle of beer (355 mL), one 5 oz glass of wine (148 mL), or one 1 oz glass of hard liquor (44 mL). Lifestyle  Take daily care of your teeth and gums.  Stay active. Exercise for at least 30 minutes on 5 or more days each week.  Do not use any products that contain nicotine or tobacco, such as cigarettes, e-cigarettes, and chewing tobacco. If you need help quitting, ask your health care provider.  If you are sexually active, practice safe sex. Use a condom or other form of protection to prevent STIs (sexually transmitted infections).  Talk with your health care provider about taking a low-dose aspirin or statin. What's next?  Visit your health care provider once a year for a well check visit.  Ask your health care provider how often you should have your eyes and teeth checked.  Stay up to date on all vaccines. This information is not intended to replace advice given to you by your health care provider. Make sure you discuss any questions you have with  your health care provider. Document Revised: 06/05/2018 Document Reviewed: 06/05/2018 Elsevier Patient Education  2020 [employer].

## 2020-02-05 LAB — COMPREHENSIVE METABOLIC PANEL
AG Ratio: 1.7 (calc) (ref 1.0–2.5)
ALT: 16 U/L (ref 9–46)
AST: 18 U/L (ref 10–35)
Albumin: 4.3 g/dL (ref 3.6–5.1)
Alkaline phosphatase (APISO): 46 U/L (ref 35–144)
BUN/Creatinine Ratio: 23 (calc) — ABNORMAL HIGH (ref 6–22)
BUN: 28 mg/dL — ABNORMAL HIGH (ref 7–25)
CO2: 26 mmol/L (ref 20–32)
Calcium: 9.7 mg/dL (ref 8.6–10.3)
Chloride: 106 mmol/L (ref 98–110)
Creat: 1.21 mg/dL — ABNORMAL HIGH (ref 0.70–1.11)
Globulin: 2.5 g/dL (calc) (ref 1.9–3.7)
Glucose, Bld: 122 mg/dL — ABNORMAL HIGH (ref 65–99)
Potassium: 5 mmol/L (ref 3.5–5.3)
Sodium: 138 mmol/L (ref 135–146)
Total Bilirubin: 0.6 mg/dL (ref 0.2–1.2)
Total Protein: 6.8 g/dL (ref 6.1–8.1)

## 2020-02-05 LAB — CBC
HCT: 40.5 % (ref 38.5–50.0)
Hemoglobin: 13.4 g/dL (ref 13.2–17.1)
MCH: 30.3 pg (ref 27.0–33.0)
MCHC: 33.1 g/dL (ref 32.0–36.0)
MCV: 91.6 fL (ref 80.0–100.0)
MPV: 13 fL — ABNORMAL HIGH (ref 7.5–12.5)
Platelets: 197 10*3/uL (ref 140–400)
RBC: 4.42 10*6/uL (ref 4.20–5.80)
RDW: 12.8 % (ref 11.0–15.0)
WBC: 9.9 10*3/uL (ref 3.8–10.8)

## 2020-02-05 LAB — LIPID PANEL
Cholesterol: 137 mg/dL (ref <200)
HDL: 42 mg/dL (ref >=40)
LDL Cholesterol (Calc): 75 mg/dL (calc)
Non-HDL Cholesterol (Calc): 95 mg/dL (calc) (ref <130)
Total CHOL/HDL Ratio: 3.3 (calc) (ref <5.0)
Triglycerides: 123 mg/dL (ref <150)

## 2020-02-05 LAB — TSH: TSH: 7.64 mIU/L — ABNORMAL HIGH (ref 0.40–4.50)

## 2020-02-05 LAB — HEMOGLOBIN A1C
Hgb A1c MFr Bld: 7 % of total Hgb — ABNORMAL HIGH (ref <5.7)
Mean Plasma Glucose: 154 (calc)
eAG (mmol/L): 8.5 (calc)

## 2020-02-05 NOTE — Progress Notes (Signed)
Please inform patient of the following:  Blood sugar went up a bit as expected but is still within goal. Would like for him to continue his current medications.  His thyroid levels are slightly off. Would like for him to come back to recheck. Please place future order for TSH, free t3 and free t4.  Would like to see him back in 6 months to recheck A1c.  Algis Greenhouse. Jerline Pain, MD 02/05/2020 9:40 AM

## 2020-02-08 ENCOUNTER — Other Ambulatory Visit: Payer: Self-pay

## 2020-02-08 ENCOUNTER — Other Ambulatory Visit: Payer: BC Managed Care – PPO

## 2020-02-08 DIAGNOSIS — E0590 Thyrotoxicosis, unspecified without thyrotoxic crisis or storm: Secondary | ICD-10-CM

## 2020-02-09 LAB — T4, FREE: Free T4: 1.2 ng/dL (ref 0.8–1.8)

## 2020-02-09 LAB — T3, FREE: T3, Free: 2.9 pg/mL (ref 2.3–4.2)

## 2020-02-09 LAB — TSH: TSH: 5.5 mIU/L — ABNORMAL HIGH (ref 0.40–4.50)

## 2020-02-10 NOTE — Progress Notes (Signed)
Please inform patient of the following:  Patient has subclinical hypothyroidism. Do not need to treat at this point but we should continue to monitor periodically as he may need thyroid replacement at some point in the future. We can recheck in a year.  Brandon Santos. Jerline Pain, MD 02/10/2020 11:44 AM

## 2020-02-25 ENCOUNTER — Emergency Department (HOSPITAL_COMMUNITY): Payer: BC Managed Care – PPO

## 2020-02-25 ENCOUNTER — Inpatient Hospital Stay (HOSPITAL_COMMUNITY)
Admission: EM | Admit: 2020-02-25 | Discharge: 2020-03-25 | DRG: 208 | Disposition: E | Payer: BC Managed Care – PPO | Attending: Pulmonary Disease | Admitting: Pulmonary Disease

## 2020-02-25 ENCOUNTER — Other Ambulatory Visit: Payer: Self-pay

## 2020-02-25 ENCOUNTER — Encounter (HOSPITAL_COMMUNITY): Payer: Self-pay | Admitting: *Deleted

## 2020-02-25 DIAGNOSIS — Z4682 Encounter for fitting and adjustment of non-vascular catheter: Secondary | ICD-10-CM | POA: Diagnosis not present

## 2020-02-25 DIAGNOSIS — J1282 Pneumonia due to coronavirus disease 2019: Secondary | ICD-10-CM | POA: Diagnosis not present

## 2020-02-25 DIAGNOSIS — Z66 Do not resuscitate: Secondary | ICD-10-CM | POA: Diagnosis not present

## 2020-02-25 DIAGNOSIS — J690 Pneumonitis due to inhalation of food and vomit: Secondary | ICD-10-CM | POA: Diagnosis not present

## 2020-02-25 DIAGNOSIS — Z9911 Dependence on respirator [ventilator] status: Secondary | ICD-10-CM | POA: Diagnosis not present

## 2020-02-25 DIAGNOSIS — I129 Hypertensive chronic kidney disease with stage 1 through stage 4 chronic kidney disease, or unspecified chronic kidney disease: Secondary | ICD-10-CM | POA: Diagnosis not present

## 2020-02-25 DIAGNOSIS — L8990 Pressure ulcer of unspecified site, unspecified stage: Secondary | ICD-10-CM | POA: Insufficient documentation

## 2020-02-25 DIAGNOSIS — E875 Hyperkalemia: Secondary | ICD-10-CM | POA: Diagnosis not present

## 2020-02-25 DIAGNOSIS — Z79899 Other long term (current) drug therapy: Secondary | ICD-10-CM

## 2020-02-25 DIAGNOSIS — Z9119 Patient's noncompliance with other medical treatment and regimen: Secondary | ICD-10-CM

## 2020-02-25 DIAGNOSIS — N170 Acute kidney failure with tubular necrosis: Secondary | ICD-10-CM | POA: Diagnosis not present

## 2020-02-25 DIAGNOSIS — R0602 Shortness of breath: Secondary | ICD-10-CM | POA: Diagnosis not present

## 2020-02-25 DIAGNOSIS — J80 Acute respiratory distress syndrome: Secondary | ICD-10-CM | POA: Diagnosis not present

## 2020-02-25 DIAGNOSIS — I700 Atherosclerosis of aorta: Secondary | ICD-10-CM | POA: Diagnosis not present

## 2020-02-25 DIAGNOSIS — Z452 Encounter for adjustment and management of vascular access device: Secondary | ICD-10-CM | POA: Diagnosis not present

## 2020-02-25 DIAGNOSIS — R338 Other retention of urine: Secondary | ICD-10-CM | POA: Diagnosis not present

## 2020-02-25 DIAGNOSIS — E785 Hyperlipidemia, unspecified: Secondary | ICD-10-CM | POA: Diagnosis present

## 2020-02-25 DIAGNOSIS — E669 Obesity, unspecified: Secondary | ICD-10-CM | POA: Diagnosis present

## 2020-02-25 DIAGNOSIS — Z8249 Family history of ischemic heart disease and other diseases of the circulatory system: Secondary | ICD-10-CM

## 2020-02-25 DIAGNOSIS — L89322 Pressure ulcer of left buttock, stage 2: Secondary | ICD-10-CM | POA: Diagnosis not present

## 2020-02-25 DIAGNOSIS — N281 Cyst of kidney, acquired: Secondary | ICD-10-CM | POA: Diagnosis not present

## 2020-02-25 DIAGNOSIS — Z981 Arthrodesis status: Secondary | ICD-10-CM | POA: Diagnosis not present

## 2020-02-25 DIAGNOSIS — E1165 Type 2 diabetes mellitus with hyperglycemia: Secondary | ICD-10-CM | POA: Diagnosis present

## 2020-02-25 DIAGNOSIS — N401 Enlarged prostate with lower urinary tract symptoms: Secondary | ICD-10-CM | POA: Diagnosis not present

## 2020-02-25 DIAGNOSIS — E1159 Type 2 diabetes mellitus with other circulatory complications: Secondary | ICD-10-CM | POA: Diagnosis present

## 2020-02-25 DIAGNOSIS — K6389 Other specified diseases of intestine: Secondary | ICD-10-CM | POA: Diagnosis not present

## 2020-02-25 DIAGNOSIS — R1310 Dysphagia, unspecified: Secondary | ICD-10-CM | POA: Diagnosis not present

## 2020-02-25 DIAGNOSIS — G9341 Metabolic encephalopathy: Secondary | ICD-10-CM | POA: Diagnosis not present

## 2020-02-25 DIAGNOSIS — K8020 Calculus of gallbladder without cholecystitis without obstruction: Secondary | ICD-10-CM | POA: Diagnosis not present

## 2020-02-25 DIAGNOSIS — R0902 Hypoxemia: Secondary | ICD-10-CM

## 2020-02-25 DIAGNOSIS — R54 Age-related physical debility: Secondary | ICD-10-CM | POA: Diagnosis not present

## 2020-02-25 DIAGNOSIS — Z4659 Encounter for fitting and adjustment of other gastrointestinal appliance and device: Secondary | ICD-10-CM

## 2020-02-25 DIAGNOSIS — I4510 Unspecified right bundle-branch block: Secondary | ICD-10-CM | POA: Diagnosis not present

## 2020-02-25 DIAGNOSIS — N179 Acute kidney failure, unspecified: Secondary | ICD-10-CM | POA: Diagnosis not present

## 2020-02-25 DIAGNOSIS — E1169 Type 2 diabetes mellitus with other specified complication: Secondary | ICD-10-CM | POA: Diagnosis not present

## 2020-02-25 DIAGNOSIS — A419 Sepsis, unspecified organism: Secondary | ICD-10-CM | POA: Diagnosis not present

## 2020-02-25 DIAGNOSIS — J90 Pleural effusion, not elsewhere classified: Secondary | ICD-10-CM | POA: Diagnosis not present

## 2020-02-25 DIAGNOSIS — Z6830 Body mass index (BMI) 30.0-30.9, adult: Secondary | ICD-10-CM | POA: Diagnosis not present

## 2020-02-25 DIAGNOSIS — E440 Moderate protein-calorie malnutrition: Secondary | ICD-10-CM | POA: Insufficient documentation

## 2020-02-25 DIAGNOSIS — R918 Other nonspecific abnormal finding of lung field: Secondary | ICD-10-CM | POA: Diagnosis not present

## 2020-02-25 DIAGNOSIS — Z7984 Long term (current) use of oral hypoglycemic drugs: Secondary | ICD-10-CM

## 2020-02-25 DIAGNOSIS — R6521 Severe sepsis with septic shock: Secondary | ICD-10-CM | POA: Diagnosis not present

## 2020-02-25 DIAGNOSIS — I2510 Atherosclerotic heart disease of native coronary artery without angina pectoris: Secondary | ICD-10-CM | POA: Diagnosis not present

## 2020-02-25 DIAGNOSIS — J189 Pneumonia, unspecified organism: Secondary | ICD-10-CM | POA: Diagnosis not present

## 2020-02-25 DIAGNOSIS — T380X5A Adverse effect of glucocorticoids and synthetic analogues, initial encounter: Secondary | ICD-10-CM | POA: Diagnosis not present

## 2020-02-25 DIAGNOSIS — B999 Unspecified infectious disease: Secondary | ICD-10-CM | POA: Diagnosis not present

## 2020-02-25 DIAGNOSIS — I708 Atherosclerosis of other arteries: Secondary | ICD-10-CM | POA: Diagnosis not present

## 2020-02-25 DIAGNOSIS — J9600 Acute respiratory failure, unspecified whether with hypoxia or hypercapnia: Secondary | ICD-10-CM

## 2020-02-25 DIAGNOSIS — J181 Lobar pneumonia, unspecified organism: Secondary | ICD-10-CM | POA: Diagnosis not present

## 2020-02-25 DIAGNOSIS — E874 Mixed disorder of acid-base balance: Secondary | ICD-10-CM | POA: Diagnosis not present

## 2020-02-25 DIAGNOSIS — Y95 Nosocomial condition: Secondary | ICD-10-CM | POA: Diagnosis not present

## 2020-02-25 DIAGNOSIS — E1122 Type 2 diabetes mellitus with diabetic chronic kidney disease: Secondary | ICD-10-CM | POA: Diagnosis not present

## 2020-02-25 DIAGNOSIS — U071 COVID-19: Secondary | ICD-10-CM | POA: Diagnosis not present

## 2020-02-25 DIAGNOSIS — I10 Essential (primary) hypertension: Secondary | ICD-10-CM | POA: Diagnosis not present

## 2020-02-25 DIAGNOSIS — L89312 Pressure ulcer of right buttock, stage 2: Secondary | ICD-10-CM | POA: Diagnosis not present

## 2020-02-25 DIAGNOSIS — R34 Anuria and oliguria: Secondary | ICD-10-CM | POA: Diagnosis not present

## 2020-02-25 DIAGNOSIS — J9690 Respiratory failure, unspecified, unspecified whether with hypoxia or hypercapnia: Secondary | ICD-10-CM | POA: Diagnosis not present

## 2020-02-25 DIAGNOSIS — J9601 Acute respiratory failure with hypoxia: Secondary | ICD-10-CM

## 2020-02-25 DIAGNOSIS — L089 Local infection of the skin and subcutaneous tissue, unspecified: Secondary | ICD-10-CM | POA: Diagnosis not present

## 2020-02-25 DIAGNOSIS — E119 Type 2 diabetes mellitus without complications: Secondary | ICD-10-CM

## 2020-02-25 DIAGNOSIS — Z9289 Personal history of other medical treatment: Secondary | ICD-10-CM

## 2020-02-25 LAB — CBC WITH DIFFERENTIAL/PLATELET
Abs Immature Granulocytes: 0.06 10*3/uL (ref 0.00–0.07)
Basophils Absolute: 0 10*3/uL (ref 0.0–0.1)
Basophils Relative: 0 %
Eosinophils Absolute: 0 10*3/uL (ref 0.0–0.5)
Eosinophils Relative: 0 %
HCT: 39.3 % (ref 39.0–52.0)
Hemoglobin: 13.3 g/dL (ref 13.0–17.0)
Immature Granulocytes: 1 %
Lymphocytes Relative: 20 %
Lymphs Abs: 1.8 10*3/uL (ref 0.7–4.0)
MCH: 30 pg (ref 26.0–34.0)
MCHC: 33.8 g/dL (ref 30.0–36.0)
MCV: 88.7 fL (ref 80.0–100.0)
Monocytes Absolute: 0.6 10*3/uL (ref 0.1–1.0)
Monocytes Relative: 7 %
Neutro Abs: 6.7 10*3/uL (ref 1.7–7.7)
Neutrophils Relative %: 72 %
Platelets: 231 10*3/uL (ref 150–400)
RBC: 4.43 MIL/uL (ref 4.22–5.81)
RDW: 13.4 % (ref 11.5–15.5)
WBC: 9.2 10*3/uL (ref 4.0–10.5)
nRBC: 0 % (ref 0.0–0.2)

## 2020-02-25 LAB — COMPREHENSIVE METABOLIC PANEL
ALT: 37 U/L (ref 0–44)
AST: 68 U/L — ABNORMAL HIGH (ref 15–41)
Albumin: 2.8 g/dL — ABNORMAL LOW (ref 3.5–5.0)
Alkaline Phosphatase: 40 U/L (ref 38–126)
Anion gap: 14 (ref 5–15)
BUN: 40 mg/dL — ABNORMAL HIGH (ref 8–23)
CO2: 20 mmol/L — ABNORMAL LOW (ref 22–32)
Calcium: 8.6 mg/dL — ABNORMAL LOW (ref 8.9–10.3)
Chloride: 102 mmol/L (ref 98–111)
Creatinine, Ser: 1.85 mg/dL — ABNORMAL HIGH (ref 0.61–1.24)
GFR calc Af Amer: 38 mL/min — ABNORMAL LOW (ref >60)
GFR calc non Af Amer: 33 mL/min — ABNORMAL LOW (ref >60)
Glucose, Bld: 212 mg/dL — ABNORMAL HIGH (ref 70–99)
Potassium: 4.2 mmol/L (ref 3.5–5.1)
Sodium: 136 mmol/L (ref 135–145)
Total Bilirubin: 0.9 mg/dL (ref 0.3–1.2)
Total Protein: 6.8 g/dL (ref 6.5–8.1)

## 2020-02-25 LAB — TRIGLYCERIDES: Triglycerides: 106 mg/dL (ref <150)

## 2020-02-25 LAB — D-DIMER, QUANTITATIVE: D-Dimer, Quant: 1.53 ug/mL-FEU — ABNORMAL HIGH (ref 0.00–0.50)

## 2020-02-25 LAB — FIBRINOGEN: Fibrinogen: 740 mg/dL — ABNORMAL HIGH (ref 210–475)

## 2020-02-25 LAB — PROCALCITONIN: Procalcitonin: 0.22 ng/mL

## 2020-02-25 LAB — LACTATE DEHYDROGENASE: LDH: 328 U/L — ABNORMAL HIGH (ref 98–192)

## 2020-02-25 LAB — CBG MONITORING, ED
Glucose-Capillary: 195 mg/dL — ABNORMAL HIGH (ref 70–99)
Glucose-Capillary: 269 mg/dL — ABNORMAL HIGH (ref 70–99)
Glucose-Capillary: 296 mg/dL — ABNORMAL HIGH (ref 70–99)

## 2020-02-25 LAB — PROTIME-INR
INR: 1 (ref 0.8–1.2)
Prothrombin Time: 12.9 seconds (ref 11.4–15.2)

## 2020-02-25 LAB — SARS CORONAVIRUS 2 BY RT PCR (HOSPITAL ORDER, PERFORMED IN ~~LOC~~ HOSPITAL LAB): SARS Coronavirus 2: POSITIVE — AB

## 2020-02-25 LAB — LACTIC ACID, PLASMA: Lactic Acid, Venous: 1.5 mmol/L (ref 0.5–1.9)

## 2020-02-25 MED ORDER — ONDANSETRON HCL 4 MG/2ML IJ SOLN: 4.0000 mg | Freq: Four times a day (QID) | INTRAMUSCULAR | Status: DC | PRN

## 2020-02-25 MED ORDER — INSULIN ASPART 100 UNIT/ML ~~LOC~~ SOLN
0.0000 [IU] | Freq: Every day | SUBCUTANEOUS | Status: DC
  Administered 2020-02-25: 3 [IU] via SUBCUTANEOUS
  Administered 2020-02-27 – 2020-02-28 (×2): 2 [IU] via SUBCUTANEOUS
  Administered 2020-02-29: 3 [IU] via SUBCUTANEOUS
  Administered 2020-03-01: 2 [IU] via SUBCUTANEOUS
  Administered 2020-03-02: 4 [IU] via SUBCUTANEOUS
  Administered 2020-03-07: 3 [IU] via SUBCUTANEOUS
  Administered 2020-03-08: 0 [IU] via SUBCUTANEOUS

## 2020-02-25 MED ORDER — FLEET ENEMA 7-19 GM/118ML RE ENEM: 1.0000 | ENEMA | Freq: Once | RECTAL | Status: DC | PRN

## 2020-02-25 MED ORDER — LATANOPROST 0.005 % OP SOLN
1.0000 [drp] | Freq: Every day | OPHTHALMIC | Status: DC
  Administered 2020-02-25 – 2020-03-10 (×14): 1 [drp] via OPHTHALMIC
  Filled 2020-02-25 (×3): qty 2.5

## 2020-02-25 MED ORDER — POLYETHYLENE GLYCOL 3350 17 G PO PACK
17.0000 g | PACK | Freq: Every day | ORAL | Status: DC | PRN
  Administered 2020-03-01: 17 g via ORAL
  Filled 2020-02-25 (×2): qty 1

## 2020-02-25 MED ORDER — METHYLPREDNISOLONE SODIUM SUCC 125 MG IJ SOLR
0.5000 mg/kg | Freq: Two times a day (BID) | INTRAMUSCULAR | Status: DC
  Administered 2020-02-25 – 2020-02-26 (×2): 42.5 mg via INTRAVENOUS
  Filled 2020-02-25 (×2): qty 2

## 2020-02-25 MED ORDER — ALBUTEROL SULFATE HFA 108 (90 BASE) MCG/ACT IN AERS
2.0000 | INHALATION_SPRAY | RESPIRATORY_TRACT | Status: DC | PRN
  Administered 2020-02-27 – 2020-03-01 (×4): 2 via RESPIRATORY_TRACT
  Filled 2020-02-25: qty 6.7

## 2020-02-25 MED ORDER — OXYCODONE HCL 5 MG PO TABS: 5.0000 mg | ORAL_TABLET | ORAL | Status: DC | PRN

## 2020-02-25 MED ORDER — SODIUM CHLORIDE 0.9% FLUSH
3.0000 mL | Freq: Two times a day (BID) | INTRAVENOUS | Status: DC
  Administered 2020-02-25 – 2020-03-11 (×30): 3 mL via INTRAVENOUS

## 2020-02-25 MED ORDER — HYDROCOD POLST-CPM POLST ER 10-8 MG/5ML PO SUER: 5.0000 mL | Freq: Two times a day (BID) | ORAL | Status: DC | PRN

## 2020-02-25 MED ORDER — SODIUM CHLORIDE 0.9 % IV SOLN: 250.0000 mL | INTRAVENOUS | Status: DC | PRN

## 2020-02-25 MED ORDER — SODIUM CHLORIDE 0.9 % IV SOLN
100.0000 mg | Freq: Every day | INTRAVENOUS | Status: AC
  Administered 2020-02-26 – 2020-02-28 (×4): 100 mg via INTRAVENOUS
  Filled 2020-02-25 (×4): qty 20

## 2020-02-25 MED ORDER — ACETAMINOPHEN 325 MG PO TABS
650.0000 mg | ORAL_TABLET | Freq: Four times a day (QID) | ORAL | Status: DC | PRN
  Administered 2020-02-29 – 2020-03-02 (×3): 650 mg via ORAL
  Filled 2020-02-25 (×4): qty 2

## 2020-02-25 MED ORDER — METOPROLOL TARTRATE 25 MG PO TABS
25.0000 mg | ORAL_TABLET | Freq: Two times a day (BID) | ORAL | Status: DC
  Administered 2020-02-25 – 2020-03-03 (×14): 25 mg via ORAL
  Filled 2020-02-25 (×15): qty 1

## 2020-02-25 MED ORDER — SODIUM CHLORIDE 0.9% FLUSH: 3.0000 mL | INTRAVENOUS | Status: DC | PRN

## 2020-02-25 MED ORDER — SODIUM CHLORIDE 0.9 % IV SOLN
200.0000 mg | Freq: Once | INTRAVENOUS | Status: AC
  Administered 2020-02-25: 200 mg via INTRAVENOUS
  Filled 2020-02-25: qty 40

## 2020-02-25 MED ORDER — DEXAMETHASONE SODIUM PHOSPHATE 10 MG/ML IJ SOLN: 10.0000 mg | Freq: Once | INTRAMUSCULAR | Status: DC

## 2020-02-25 MED ORDER — AMLODIPINE BESYLATE 5 MG PO TABS
5.0000 mg | ORAL_TABLET | Freq: Every day | ORAL | Status: DC
  Administered 2020-02-26 – 2020-03-02 (×6): 5 mg via ORAL
  Filled 2020-02-25 (×7): qty 1

## 2020-02-25 MED ORDER — BISACODYL 5 MG PO TBEC
5.0000 mg | DELAYED_RELEASE_TABLET | Freq: Every day | ORAL | Status: DC | PRN
  Administered 2020-03-02 – 2020-03-03 (×2): 5 mg via ORAL
  Filled 2020-02-25 (×3): qty 1

## 2020-02-25 MED ORDER — GUAIFENESIN-DM 100-10 MG/5ML PO SYRP
10.0000 mL | ORAL_SOLUTION | ORAL | Status: DC | PRN
  Administered 2020-02-27 – 2020-03-08 (×3): 10 mL via ORAL
  Filled 2020-02-25 (×4): qty 10

## 2020-02-25 MED ORDER — INSULIN ASPART 100 UNIT/ML ~~LOC~~ SOLN
0.0000 [IU] | Freq: Three times a day (TID) | SUBCUTANEOUS | Status: DC
  Administered 2020-02-25: 3 [IU] via SUBCUTANEOUS
  Administered 2020-02-25: 8 [IU] via SUBCUTANEOUS
  Administered 2020-02-26 (×2): 11 [IU] via SUBCUTANEOUS
  Administered 2020-02-26: 8 [IU] via SUBCUTANEOUS
  Administered 2020-02-27: 3 [IU] via SUBCUTANEOUS

## 2020-02-25 MED ORDER — ONDANSETRON HCL 4 MG PO TABS: 4.0000 mg | ORAL_TABLET | Freq: Four times a day (QID) | ORAL | Status: DC | PRN

## 2020-02-25 MED ORDER — TIMOLOL MALEATE 0.5 % OP SOLN
1.0000 [drp] | Freq: Every day | OPHTHALMIC | Status: DC
  Administered 2020-02-26 – 2020-03-11 (×15): 1 [drp] via OPHTHALMIC
  Filled 2020-02-25 (×2): qty 5

## 2020-02-25 MED ORDER — SIMVASTATIN 20 MG PO TABS
20.0000 mg | ORAL_TABLET | Freq: Every evening | ORAL | Status: DC
  Administered 2020-02-25 – 2020-03-08 (×12): 20 mg via ORAL
  Filled 2020-02-25 (×13): qty 1

## 2020-02-25 MED ORDER — NETARSUDIL-LATANOPROST 0.02-0.005 % OP SOLN: 1.0000 [drp] | Freq: Every day | OPHTHALMIC | Status: DC

## 2020-02-25 MED ORDER — ENOXAPARIN SODIUM 40 MG/0.4ML ~~LOC~~ SOLN
40.0000 mg | SUBCUTANEOUS | Status: DC
  Administered 2020-02-25 – 2020-02-26 (×2): 40 mg via SUBCUTANEOUS
  Filled 2020-02-25 (×2): qty 0.4

## 2020-02-25 NOTE — ED Notes (Signed)
Pt denies being vaccinated stating "I don't believe in it". He states he has no interest in becoming vaccinated.

## 2020-02-25 NOTE — ED Provider Notes (Signed)
[facility] EMERGENCY DEPARTMENT Provider Note  CSN: [id number] Arrival date & time: 02/26/2020 0923    History Chief Complaint  Patient presents with  . Shortness of Breath    HPI  Brandon Santos is a 86 y.o. male with history of HTN, DM reports his wife is Covid+ in the ICU. He began having a dry, scratchy throat about 3-4 days ago. He denies any cough, fever or SOB to me, but was noted to be hypoxic and tachypneic on arrival in triage. He is not vaccinated.    Past Medical History:  Diagnosis Date  . BPH (benign prostatic hyperplasia)   . Colon polyp   . Diabetes mellitus without complication ([place])   . Hemorrhoid   . Hyperlipemia   . Hypertension   . Spinal stenosis     Past Surgical History:  Procedure Laterality Date  . APPENDECTOMY  1966  . COLONOSCOPY  2013   had it close to end of year, exam was normal  . Drain fluid from lungs  1938   states "I was 86 year old"  . LUMBAR WOUND DEBRIDEMENT N/A 02/24/2014   Procedure: Revision of Lumbar Wound;  Surgeon: Eustace Moore, MD;  Location: [facility];  Service: Neurosurgery;  Laterality: N/A;  Revision of Lumbar Wound  . MAXIMUM ACCESS (MAS)POSTERIOR LUMBAR INTERBODY FUSION (PLIF) 1 LEVEL N/A 01/21/2014   Procedure: FOR MAXIMUM ACCESS (MAS) POSTERIOR LUMBAR INTERBODY FUSION (PLIF) LUMBAR FOUR FIVE;  Surgeon: Eustace Moore, MD;  Location: [facility];  Service: Neurosurgery;  Laterality: N/A;    Family History  Problem Relation Age of Onset  . Heart disease Father   . Colon cancer Neg Hx     Social History   Tobacco Use  . Smoking status: Never Smoker  . Smokeless tobacco: Never Used  Substance Use Topics  . Alcohol use: No    Comment: only drinks at Party/Holidays  . Drug use: No     Home Medications Prior to Admission medications   Medication Sig Start Date End Date Taking? Authorizing Provider  amLODipine (NORVASC) 5 MG tablet Take 1 tablet (5 mg total) by mouth daily. 08/10/19   Vivi Barrack, MD    glucose blood [facility] VERIO) test strip Use as instructed to check blood sugar once a day. 02/05/14   Lucretia Kern, DO  lisinopril-hydrochlorothiazide (ZESTORETIC) 20-12.5 MG tablet Take 2 tablets by mouth daily. 08/10/19   Vivi Barrack, MD  metFORMIN (GLUCOPHAGE) 1000 MG tablet Take 1 tablet (1,000 mg total) by mouth 2 (two) times daily with a meal. 08/10/19   Vivi Barrack, MD  metoprolol tartrate (LOPRESSOR) 25 MG tablet Take 1 tablet (25 mg total) by mouth 2 (two) times daily. 08/10/19   Vivi Barrack, MD  Multiple Vitamins-Minerals (CENTRUM PO) Take 1 tablet by mouth daily.     [provider]  Jonetta Speak LANCETS 30Q MISC Use as directed to check blood sugar once a day 02/05/14   Colin Benton R, DO  ROCKLATAN 0.02-0.005 % SOLN Apply 1 drop to eye at bedtime. 12/29/19   [provider]  simvastatin (ZOCOR) 20 MG tablet Take 1 tablet (20 mg total) by mouth every evening. 08/10/19   Vivi Barrack, MD  timolol (TIMOPTIC) 0.5 % ophthalmic solution Place 1 drop into both eyes daily.  02/04/16   [provider]     Allergies    Patient has no known allergies.   Review of Systems   Review  of Systems A comprehensive review of systems was completed and negative except as noted in HPI.    Physical Exam BP (!) 146/59   Pulse 78   Temp 98.8 F (37.1 C) (Oral)   Resp (!) 30   Wt 84.5 kg   SpO2 93%   BMI 29.62 kg/m   Physical Exam Vitals and nursing note reviewed.  Constitutional:      Appearance: Normal appearance.  HENT:     Head: Normocephalic and atraumatic.     Nose: Nose normal.     Mouth/Throat:     Mouth: Mucous membranes are moist.  Eyes:     Extraocular Movements: Extraocular movements intact.     Conjunctiva/sclera: Conjunctivae normal.  Cardiovascular:     Rate and Rhythm: Normal rate.  Pulmonary:     Effort: Tachypnea present.     Breath sounds: Normal breath sounds.  Abdominal:     General: Abdomen is flat.     Palpations:  Abdomen is soft.     Tenderness: There is no abdominal tenderness.  Musculoskeletal:        General: No swelling. Normal range of motion.     Cervical back: Neck supple.  Skin:    General: Skin is warm and dry.  Neurological:     General: No focal deficit present.     Mental Status: He is alert.  Psychiatric:        Mood and Affect: Mood normal.      ED Results / Procedures / Treatments   Labs (all labs ordered are listed, but only abnormal results are displayed) Labs Reviewed  SARS CORONAVIRUS 2 BY RT PCR (HOSPITAL ORDER, [facility]) - Abnormal; Notable for the following components:      Result Value   SARS Coronavirus 2 POSITIVE (*)    All other components within normal limits  COMPREHENSIVE METABOLIC PANEL - Abnormal; Notable for the following components:   CO2 20 (*)    Glucose, Bld 212 (*)    BUN 40 (*)    Creatinine, Ser 1.85 (*)    Calcium 8.6 (*)    Albumin 2.8 (*)    AST 68 (*)    GFR calc non Af Amer 33 (*)    GFR calc Af Amer 38 (*)    All other components within normal limits  D-DIMER, QUANTITATIVE (NOT AT [facility]) - Abnormal; Notable for the following components:   D-Dimer, Quant 1.53 (*)    All other components within normal limits  LACTATE DEHYDROGENASE - Abnormal; Notable for the following components:   LDH 328 (*)    All other components within normal limits  FIBRINOGEN - Abnormal; Notable for the following components:   Fibrinogen 740 (*)    All other components within normal limits  LACTIC ACID, PLASMA  CBC WITH DIFFERENTIAL/PLATELET  PROTIME-INR  PROCALCITONIN  TRIGLYCERIDES  FERRITIN  C-REACTIVE PROTEIN    EKG EKG Interpretation  Date/Time:  Thursday February 25 2020 07:46:32 EDT Ventricular Rate:  86 PR Interval:  120 QRS Duration: 124 QT Interval:  384 QTC Calculation: 459 R Axis:   119 Text Interpretation: Normal sinus rhythm Right bundle branch block Abnormal ECG No significant change since last tracing  Confirmed by Calvert Cantor [phone]) on 02/29/2020 8:45:16 AM   Radiology DG Chest Port 1 View  Result Date: 03/10/2020 CLINICAL DATA:  COVID shortness of breath EXAM: PORTABLE CHEST 1 VIEW COMPARISON:  January 14, 2014. FINDINGS: Trachea midline. Cardiomediastinal contours are stable. Interval  development of patchy basilar and mid lung predominant opacities in the LEFT and RIGHT chest both interstitial and airspace component. Process more pronounced at the LEFT lung base. Visualized skeletal structures on limited assessment without acute process. No sign of pleural effusion. IMPRESSION: Patchy basilar and mid lung predominant opacities in the LEFT and RIGHT chest both interstitial and airspace component, findings could be seen in the setting of viral or atypical pneumonia, including COVID-19 infection. Electronically Signed   By: Zetta Bills M.D.   On: 03/14/2020 08:12    Procedures .Critical Care Performed by: Truddie Hidden, MD Authorized by: Truddie Hidden, MD   Critical care provider statement:    Critical care time (minutes):  35   Critical care was necessary to treat or prevent imminent or life-threatening deterioration of the following conditions:  Respiratory failure   Critical care was time spent personally by me on the following activities:  Discussions with consultants, evaluation of patient's response to treatment, examination of patient, ordering and performing treatments and interventions, ordering and review of laboratory studies, ordering and review of radiographic studies, pulse oximetry, re-evaluation of patient's condition, obtaining history from patient or surrogate and review of old charts    Medications Ordered in the ED Medications  remdesivir 200 mg in sodium chloride 0.9% 250 mL IVPB (has no administration in time range)    Followed by  remdesivir 100 mg in sodium chloride 0.9 % 100 mL IVPB (has no administration in time range)  amLODipine (NORVASC) tablet 5  mg (has no administration in time range)  metoprolol tartrate (LOPRESSOR) tablet 25 mg (has no administration in time range)  Netarsudil-Latanoprost 0.02-0.005 % SOLN 1 drop (has no administration in time range)  simvastatin (ZOCOR) tablet 20 mg (has no administration in time range)  timolol (TIMOPTIC) 0.5 % ophthalmic solution 1 drop (has no administration in time range)  sodium chloride flush (NS) 0.9 % injection 3 mL (has no administration in time range)  sodium chloride flush (NS) 0.9 % injection 3 mL (has no administration in time range)  0.9 %  sodium chloride infusion (has no administration in time range)  albuterol (VENTOLIN HFA) 108 (90 Base) MCG/ACT inhaler 2 puff (has no administration in time range)  methylPREDNISolone sodium succinate (SOLU-MEDROL) 125 mg/2 mL injection 42.5 mg (has no administration in time range)  guaiFENesin-dextromethorphan (ROBITUSSIN DM) 100-10 MG/5ML syrup 10 mL (has no administration in time range)  chlorpheniramine-HYDROcodone (TUSSIONEX) 10-8 MG/5ML suspension 5 mL (has no administration in time range)  acetaminophen (TYLENOL) tablet 650 mg (has no administration in time range)  oxyCODONE (Oxy IR/ROXICODONE) immediate release tablet 5 mg (has no administration in time range)  polyethylene glycol (MIRALAX / GLYCOLAX) packet 17 g (has no administration in time range)  bisacodyl (DULCOLAX) EC tablet 5 mg (has no administration in time range)  sodium phosphate (FLEET) 7-19 GM/118ML enema 1 enema (has no administration in time range)  ondansetron (ZOFRAN) tablet 4 mg (has no administration in time range)    Or  ondansetron (ZOFRAN) injection 4 mg (has no administration in time range)  enoxaparin (LOVENOX) injection 40 mg (has no administration in time range)  insulin aspart (novoLOG) injection 0-15 Units (has no administration in time range)  insulin aspart (novoLOG) injection 0-5 Units (has no administration in time range)     MDM  Rules/Calculators/A&P MDM Patient with suspected Covid noted to by hypoxic in triage, placed on O2 by [place] with some improvement but still 89-90% on 5L at the time of  my evaluation. Will continue to titrate oxygen up to keep SpO2 above 90%. His EKG is unremarkable, CXR consistent with Covid. Labs ordered including Covid panel with inflammatory markers. Anticipate admission, patient amenable to this plan. He is Full Code, including intubation if necessary.  ED Course  I have reviewed the triage vital signs and the nursing notes.  Pertinent labs & imaging results that were available during my care of the patient were reviewed by me and considered in my medical decision making (see chart for details).  Clinical Course as of Feb 24 1225  Thu Feb 25, 2020  0902 CMP with mild elevation in Creatinine above baseline. CBC normal. Inflammatory markers are elevated. Awaiting confirmation of Covid test and then will admit.    [CS]  1017 Covid confirmed positive. Will discuss admission with Hospitalist.Decadron and Remdesivir ordered.    [CS]  Zena was evaluated in Emergency Department on 03/06/2020 for the symptoms described in the history of present illness. He was evaluated in the context of the global COVID-19 pandemic, which necessitated consideration that the patient might be at risk for infection with the SARS-CoV-2 virus that causes COVID-19. Institutional protocols and algorithms that pertain to the evaluation of patients at risk for COVID-19 are in a state of rapid change based on information released by regulatory bodies including the CDC and federal and state organizations. These policies and algorithms were followed during the patient's care in the ED.     [CS]    Clinical Course User Index [CS] Truddie Hidden, MD    Final Clinical Impression(s) / ED Diagnoses Final diagnoses:  COVID-19  Acute respiratory failure with hypoxia [facility])    Rx / DC Orders ED Discharge Orders     None       Truddie Hidden, MD 02/26/2020 1226

## 2020-02-25 NOTE — H&P (Signed)
History and Physical    Abhinav Mayorquin AJO:[id number] DOB: [birth year] DOA: 03/05/2020  PCP: Vivi Barrack, MD Consultants:  None Patient coming from:  Home - lives with wife (who is currently intubated in the ICU with COVID); NOK: [place], [place], [phone]  Chief Complaint: worsening COVID symptoms  HPI: Brandon Santos is a 86 y.o. male with medical history significant of HTN; HLD; and DM presenting with worsening COVID symptoms.  His wife is currently in the ICU with COVID.  The patient is very vague about his symptoms, reports that he doesn't believe in the COVID vaccine.  He acknowledges fatigue, denies SOB despite O2 sats in 70s and elevated RR.  Denies fever, cough, GI symptoms.    His son reports that he is very strong-willed.  He was doing ok.  His temperature has been normal for days, has been drinking and eating.  His main symptoms seem to be weakness, sleeping a lot.  Over the last 24 hours, he has had more difficulty breathing.      ED Course:  Patient's wife is in ICU with COVID, here with symptoms since Sunday.  78% on RA, RR 35.  On 6L with 92-93%.   Review of Systems: As per HPI; otherwise review of systems reviewed and negative.   Ambulatory Status:   Ambulates without assistance  COVID Vaccine Status:  None  Past Medical History:  Diagnosis Date  . BPH (benign prostatic hyperplasia)   . Colon polyp   . Diabetes mellitus without complication ([place])   . Hemorrhoid   . Hyperlipemia   . Hypertension   . Spinal stenosis     Past Surgical History:  Procedure Laterality Date  . APPENDECTOMY  1966  . COLONOSCOPY  2013   had it close to end of year, exam was normal  . Drain fluid from lungs  1938   states "I was 86 year old"  . LUMBAR WOUND DEBRIDEMENT N/A 02/24/2014   Procedure: Revision of Lumbar Wound;  Surgeon: Eustace Moore, MD;  Location: [place] [facility];  Service: Neurosurgery;  Laterality: N/A;  Revision of Lumbar Wound  . MAXIMUM ACCESS (MAS)POSTERIOR LUMBAR  INTERBODY FUSION (PLIF) 1 LEVEL N/A 01/21/2014   Procedure: FOR MAXIMUM ACCESS (MAS) POSTERIOR LUMBAR INTERBODY FUSION (PLIF) LUMBAR FOUR FIVE;  Surgeon: Eustace Moore, MD;  Location: [place] [facility];  Service: Neurosurgery;  Laterality: N/A;    Social History   Socioeconomic History  . Marital status: Married    Spouse name: Not on file  . Number of children: Not on file  . Years of education: Not on file  . Highest education level: Not on file  Occupational History  . Not on file  Tobacco Use  . Smoking status: Never Smoker  . Smokeless tobacco: Never Used  Substance and Sexual Activity  . Alcohol use: No    Comment: only drinks at Party/Holidays  . Drug use: No  . Sexual activity: Not on file  Other Topics Concern  . 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 Difficulty of Paying Living Expenses: Not on file  Food Insecurity:   .  Worried About Charity fundraiser in the Last Year: Not on file  . Ran Out of Food in the Last Year: Not on file  Transportation Needs:   . Lack of Transportation (Medical): Not on file  . Lack of Transportation (Non-Medical): Not on file  Physical Activity:   . Days of Exercise per Week: Not on file  . Minutes of Exercise per Session: Not on file  Stress:   . Feeling of Stress : Not on file  Social Connections:   . Frequency of Communication with Friends and Family: Not on file  . Frequency of Social Gatherings with Friends and Family: Not on file  . Attends Religious Services: Not on file  . Active Member of Clubs or Organizations: Not on file  . Attends Archivist Meetings: Not on file  . Marital Status: Not on file  Intimate Partner Violence:   . Fear of Current or  Ex-Partner: Not on file  . Emotionally Abused: Not on file  . Physically Abused: Not on file  . Sexually Abused: Not on file    No Known Allergies  Family History  Problem Relation Age of Onset  . Heart disease Father   . Colon cancer Neg Hx     Prior to Admission medications   Medication Sig Start Date End Date Taking? Authorizing Provider  amLODipine (NORVASC) 5 MG tablet Take 1 tablet (5 mg total) by mouth daily. 08/10/19   Vivi Barrack, MD  glucose blood [facility] VERIO) test strip Use as instructed to check blood sugar once a day. 02/05/14   Lucretia Kern, DO  lisinopril-hydrochlorothiazide (ZESTORETIC) 20-12.5 MG tablet Take 2 tablets by mouth daily. 08/10/19   Vivi Barrack, MD  metFORMIN (GLUCOPHAGE) 1000 MG tablet Take 1 tablet (1,000 mg total) by mouth 2 (two) times daily with a meal. 08/10/19   Vivi Barrack, MD  metoprolol tartrate (LOPRESSOR) 25 MG tablet Take 1 tablet (25 mg total) by mouth 2 (two) times daily. 08/10/19   Vivi Barrack, MD  Multiple Vitamins-Minerals (CENTRUM PO) Take 1 tablet by mouth daily.     [provider]  Jonetta Speak LANCETS 08M MISC Use as directed to check blood sugar once a day 02/05/14   Colin Benton R, DO  ROCKLATAN 0.02-0.005 % SOLN Apply 1 drop to eye at bedtime. 12/29/19   [provider]  simvastatin (ZOCOR) 20 MG tablet Take 1 tablet (20 mg total) by mouth every evening. 08/10/19   Vivi Barrack, MD  timolol (TIMOPTIC) 0.5 % ophthalmic solution Place 1 drop into both eyes daily.  02/04/16   [provider]    Physical Exam: Vitals:   03/07/2020 0749 03/18/2020 0750 03/06/2020 0915  BP:  (!) 161/54 (!) 146/59  Pulse:  86 78  Resp:  (!) 25 (!) 30  Temp:  98.8 F (37.1 C)   TempSrc:  Oral   SpO2: (!) 86% (!) 86% 93%     . General:  Appears calm and comfortable and is NAD . Eyes:  PERRL, EOMI, normal lids, iris, mild conjunctival injection . ENT:  grossly normal hearing, lips & tongue, mmm . Neck:  no  LAD, masses or thyromegaly . Cardiovascular:  RRR, no m/r/g. No LE edema.  Marland Kitchen Respiratory:   Scattered rhonchi.  Increased respiratory effort. . Abdomen:  soft, NT, ND, NABS . Skin:  no rash or induration seen on limited exam . Musculoskeletal:  grossly normal tone BUE/BLE, good ROM, no bony  abnormality . Psychiatric:  stoic mood and affect, speech fluent and appropriate, AOx3 . Neurologic:  CN 2-12 grossly intact, moves all extremities in coordinated fashion    Radiological Exams on Admission: DG Chest Port 1 View  Result Date: 03/18/2020 CLINICAL DATA:  COVID shortness of breath EXAM: PORTABLE CHEST 1 VIEW COMPARISON:  January 14, 2014. FINDINGS: Trachea midline. Cardiomediastinal contours are stable. Interval development of patchy basilar and mid lung predominant opacities in the LEFT and RIGHT chest both interstitial and airspace component. Process more pronounced at the LEFT lung base. Visualized skeletal structures on limited assessment without acute process. No sign of pleural effusion. IMPRESSION: Patchy basilar and mid lung predominant opacities in the LEFT and RIGHT chest both interstitial and airspace component, findings could be seen in the setting of viral or atypical pneumonia, including COVID-19 infection. Electronically Signed   By: Zetta Bills M.D.   On: 03/19/2020 08:12    EKG: Independently reviewed.  NSR with rate 86; RBBB; nonspecific ST changes with no evidence of acute ischemia, NSCSLT   Labs on Admission: I have personally reviewed the available labs and imaging studies at the time of the admission.  Pertinent labs:   Glucose 212 BUN 40/Creatinine 1.85/GFR 33 Albumin 2.8 AST 68/ALT 37 LDH 328 Lactate 1.5 Procalcitonin 0.22 Normal CBC D-dimer 1.53 Fibrinogen 740 INR 1.0 TSH on 8/16 5.50, normal T3 and T4   Assessment/Plan Principal Problem:   Acute hypoxemic respiratory failure due to COVID-19 [facility]) Active Problems:   Diabetes ([place])   Hypertension  associated with diabetes ([place])   Hyperlipidemia associated with type 2 diabetes mellitus (HCC)   Acute respiratory failure with hypoxia due to COVID-19 PNA -Patient with presenting with SOB and weakness  -He does not have a usual home O2 requirement and is currently requiring 6L [place] O2 -COVID POSITIVE -The patient has comorbidities which may increase the risk for ARDS/MODS including: age, HTN, DM -Pertinent labs concerning for COVID include normal WBC count; increased BUN/Creatinine; increased LFTs; increased LDH; markedly elevated D-dimer (>1); low procalcitonin; increased fibrinogen -CXR with multifocal opacities which may be c/w COVID vs. Multifocal PNA -Will not treat with broad-spectrum antibiotics given procalcitonin <0.5 -Will admit for further evaluation, close monitoring, and treatment -Monitor on telemetry x at least 24 hours -At this time, will attempt to avoid use of aerosolized medications and use HFAs instead -Will check daily labs including BMP with Mag, Phos; LFTs; CBC with differential; CRP; ferritin; fibrinogen; D-dimer -Will order steroids and Remdesivir (pharmacy consult) given +COVID test, +CXR, and hypoxia <94% on room air -If the patient shows clinical deterioration, consider transfer to ICU with PCCM consultation -Consider Tocilizumab if the patient does not stabilize on current treatment or if the patient has marked clinical decompensation; the patient does not appear to require these treatments at this time but has been consented and is willing to receive the treatment if needed. -Will attempt to maintain euvolemia to a net negative fluid status -Will ask the patient to maintain an awake prone position for 16+ hours a day, if possible, with a minimum of 2-3 hours at a time -With D-dimer <5, will use standard-dosed Lovenox for DVT prevention -Patient was seen wearing full PPE including: gown, gloves, head cover, N95, and face shield; donning and doffing was in compliance  with current standards.  DM -Recent A1c (8/12) was 7.0 -hold Glucophage -Cover with moderate-scale SSI  HTN -Continue Norvasc, Lopressor -Hold Lisinopril-HCTZ  HLD -Continue Zocor      DVT prophylaxis:  Lovenox  Code Status:  Full - confirmed with patient Family Communication: None present; I spoke with the patient's son by telephone. Disposition Plan:  The patient is from: home  Anticipated d/c is to: home without [facility] services once his respiratory issues have been resolved.  He may require home O2 at the time of discharge.  Anticipated d/c date will depend on clinical response to treatment, likely between 3 days (with completion of outpatient Remdesivir treatment) and 5 days  Patient is currently: acutely ill Consults called: None  Admission status: Admit - It is my clinical opinion that admission to INPATIENT is reasonable and necessary because of the expectation that this patient will require hospital care that crosses at least 2 midnights to treat this condition based on the medical complexity of the problems presented.  Given the aforementioned information, the predictability of an adverse outcome is felt to be significant.      Karmen Bongo MD [facility]   How to contact the [facility] Attending or Consulting provider [place] or covering provider during after hours [place], for this patient?  1. Check the care team in [facility] and look for a) attending/consulting TR[facility] provider listed and b) the [facility] team listed 2. Log into [email or web address] and use [place]'s universal password to access. If you do not have the password, please contact the hospital operator. 3. Locate the [facility] provider you are looking for under [facility] and page to a number that you can be directly reached. 4. If you still have difficulty reaching the provider, please page the [facility] (Director on Call) for the Hospitalists listed on amion for assistance.   03/21/2020, 11:31 AM

## 2020-02-25 NOTE — ED Triage Notes (Signed)
Pts wife is currently in ICU covid +. Pt began having sob 2 days ago, denies any other symptoms. Spo2 78% on room air, 86% on 4L .

## 2020-02-26 DIAGNOSIS — E1122 Type 2 diabetes mellitus with diabetic chronic kidney disease: Secondary | ICD-10-CM

## 2020-02-26 DIAGNOSIS — E1159 Type 2 diabetes mellitus with other circulatory complications: Secondary | ICD-10-CM

## 2020-02-26 DIAGNOSIS — E1169 Type 2 diabetes mellitus with other specified complication: Secondary | ICD-10-CM

## 2020-02-26 DIAGNOSIS — E785 Hyperlipidemia, unspecified: Secondary | ICD-10-CM

## 2020-02-26 DIAGNOSIS — I10 Essential (primary) hypertension: Secondary | ICD-10-CM

## 2020-02-26 LAB — CBC WITH DIFFERENTIAL/PLATELET
Abs Immature Granulocytes: 0.07 10*3/uL (ref 0.00–0.07)
Basophils Absolute: 0 10*3/uL (ref 0.0–0.1)
Basophils Relative: 0 %
Eosinophils Absolute: 0 10*3/uL (ref 0.0–0.5)
Eosinophils Relative: 0 %
HCT: 41.6 % (ref 39.0–52.0)
Hemoglobin: 13.8 g/dL (ref 13.0–17.0)
Immature Granulocytes: 1 %
Lymphocytes Relative: 12 %
Lymphs Abs: 1.2 10*3/uL (ref 0.7–4.0)
MCH: 29.2 pg (ref 26.0–34.0)
MCHC: 33.2 g/dL (ref 30.0–36.0)
MCV: 88.1 fL (ref 80.0–100.0)
Monocytes Absolute: 0.5 10*3/uL (ref 0.1–1.0)
Monocytes Relative: 5 %
Neutro Abs: 8.3 10*3/uL — ABNORMAL HIGH (ref 1.7–7.7)
Neutrophils Relative %: 82 %
Platelets: 255 10*3/uL (ref 150–400)
RBC: 4.72 MIL/uL (ref 4.22–5.81)
RDW: 13.5 % (ref 11.5–15.5)
WBC: 10.1 10*3/uL (ref 4.0–10.5)
nRBC: 0 % (ref 0.0–0.2)

## 2020-02-26 LAB — GLUCOSE, CAPILLARY
Glucose-Capillary: 272 mg/dL — ABNORMAL HIGH (ref 70–99)
Glucose-Capillary: 317 mg/dL — ABNORMAL HIGH (ref 70–99)
Glucose-Capillary: 310 mg/dL — ABNORMAL HIGH (ref 70–99)
Glucose-Capillary: 194 mg/dL — ABNORMAL HIGH (ref 70–99)

## 2020-02-26 LAB — COMPREHENSIVE METABOLIC PANEL
ALT: 44 U/L (ref 0–44)
AST: 90 U/L — ABNORMAL HIGH (ref 15–41)
Albumin: 2.6 g/dL — ABNORMAL LOW (ref 3.5–5.0)
Alkaline Phosphatase: 47 U/L (ref 38–126)
Anion gap: 12 (ref 5–15)
BUN: 38 mg/dL — ABNORMAL HIGH (ref 8–23)
CO2: 22 mmol/L (ref 22–32)
Calcium: 8.6 mg/dL — ABNORMAL LOW (ref 8.9–10.3)
Chloride: 102 mmol/L (ref 98–111)
Creatinine, Ser: 1.48 mg/dL — ABNORMAL HIGH (ref 0.61–1.24)
GFR calc Af Amer: 50 mL/min — ABNORMAL LOW (ref >60)
GFR calc non Af Amer: 43 mL/min — ABNORMAL LOW (ref >60)
Glucose, Bld: 269 mg/dL — ABNORMAL HIGH (ref 70–99)
Potassium: 4.5 mmol/L (ref 3.5–5.1)
Sodium: 136 mmol/L (ref 135–145)
Total Bilirubin: 1.4 mg/dL — ABNORMAL HIGH (ref 0.3–1.2)
Total Protein: 6.4 g/dL — ABNORMAL LOW (ref 6.5–8.1)

## 2020-02-26 LAB — D-DIMER, QUANTITATIVE: D-Dimer, Quant: 1.3 ug/mL-FEU — ABNORMAL HIGH (ref 0.00–0.50)

## 2020-02-26 LAB — C-REACTIVE PROTEIN: CRP: 19.3 mg/dL — ABNORMAL HIGH (ref <1.0)

## 2020-02-26 LAB — MAGNESIUM: Magnesium: 1.9 mg/dL (ref 1.7–2.4)

## 2020-02-26 LAB — FERRITIN: Ferritin: 630 ng/mL — ABNORMAL HIGH (ref 24–336)

## 2020-02-26 LAB — PHOSPHORUS: Phosphorus: 2.3 mg/dL — ABNORMAL LOW (ref 2.5–4.6)

## 2020-02-26 MED ORDER — INSULIN ASPART 100 UNIT/ML ~~LOC~~ SOLN
4.0000 [IU] | Freq: Three times a day (TID) | SUBCUTANEOUS | Status: DC
  Administered 2020-02-26 – 2020-02-28 (×6): 4 [IU] via SUBCUTANEOUS

## 2020-02-26 MED ORDER — PANTOPRAZOLE SODIUM 40 MG PO TBEC
40.0000 mg | DELAYED_RELEASE_TABLET | Freq: Every day | ORAL | Status: DC
  Administered 2020-02-26 – 2020-03-08 (×12): 40 mg via ORAL
  Filled 2020-02-26 (×12): qty 1

## 2020-02-26 MED ORDER — MAGNESIUM SULFATE 2 GM/50ML IV SOLN
2.0000 g | Freq: Once | INTRAVENOUS | Status: AC
  Administered 2020-02-26: 2 g via INTRAVENOUS
  Filled 2020-02-26: qty 50

## 2020-02-26 MED ORDER — METHYLPREDNISOLONE SODIUM SUCC 125 MG IJ SOLR
80.0000 mg | Freq: Two times a day (BID) | INTRAMUSCULAR | Status: DC
  Administered 2020-02-26 – 2020-02-27 (×4): 80 mg via INTRAVENOUS
  Filled 2020-02-26 (×4): qty 2

## 2020-02-26 MED ORDER — INSULIN DETEMIR 100 UNIT/ML ~~LOC~~ SOLN
10.0000 [IU] | Freq: Two times a day (BID) | SUBCUTANEOUS | Status: DC
  Administered 2020-02-26 – 2020-02-28 (×4): 10 [IU] via SUBCUTANEOUS
  Filled 2020-02-26 (×7): qty 0.1

## 2020-02-26 MED ORDER — ENOXAPARIN SODIUM 40 MG/0.4ML ~~LOC~~ SOLN
40.0000 mg | Freq: Two times a day (BID) | SUBCUTANEOUS | Status: DC
  Administered 2020-02-27 – 2020-03-08 (×23): 40 mg via SUBCUTANEOUS
  Filled 2020-02-26 (×23): qty 0.4

## 2020-02-26 MED ORDER — TOCILIZUMAB 400 MG/20ML IV SOLN
8.0000 mg/kg | Freq: Once | INTRAVENOUS | Status: AC
  Administered 2020-02-26: 676 mg via INTRAVENOUS
  Filled 2020-02-26: qty 33.8

## 2020-02-26 NOTE — ED Notes (Signed)
Attempted report 

## 2020-02-26 NOTE — Progress Notes (Signed)
PROGRESS NOTE                                                                                                                                                                                                             Patient Demographics:    Brandon Santos, is a 86 y.o. male, DOB - 02-07-1934, ZOX:[id number]  Outpatient Primary MD for the patient is Vivi Barrack, MD   Admit date - 02/26/2020   LOS - 1  Chief Complaint  Patient presents with  . Shortness of Breath       Brief Narrative: Patient is a 86 y.o. male with PMHx of HTN, HLD, DM-2-who was not feeling well for the past 9-10 days-presented to the ED with several days of worsening shortness of breath.  He was found to have acute hypoxic respiratory failure secondary to COVID-19 pneumonia and admitted to the hospitalist service.  Post admission-he deteriorated with worsening oxygen requirement-and was placed on 15 L HFNC with NRB.  COVID-19 vaccinated status: Unvaccinated  Significant Events: 9/2>> Admit to [facility] for for severe hypoxemia secondary to COVID-19 pneumonia  Significant studies: 9/2>>Chest x-ray: Patchy bilateral lung infiltrates  COVID-19 medications: Steroids: 9/2>> Remdesivir: 9/2>> Actemra: 9/3 x 1  Antibiotics: None  Microbiology data: None  Procedures: None  Consults: None  DVT prophylaxis: Prophylactic Lovenox-change to twice daily dosing.    Subjective:    Brandon Santos today remains comfortable but on HFNC 15 L and NRB.   Assessment  & Plan :   Acute Hypoxic Resp Failure due to Covid 19 Viral pneumonia: Has severe hypoxemia-on 15 L HFNC and NRB.  Increase Solu-Medrol to 80 mg twice daily dosing-continue Remdesivir.  Actemra x1 ordered this morning. Although with severe hypoxemia-he appears comfortable-stable for close monitoring  I have discussed at length with patient and son (over the phone)-both are aware of the tenuous  clinical situation-potential for further decline-needing intubation/ventilator dependence.  Family and patient aware that if he deteriorates significantly-he may not survive this hospitalization.  I urged him to consider DNR status.  Family discussion in progress-I will follow up tomorrow.  Rationale for Actemra on the EUA by FDA discussed in great detail-he has no history of TB, hepatitis B or diverticulitis.  He understands risks-benefits-rationale-and consents to the use of Actemra.  Fever: afebrile O2 requirements:  SpO2: (!) 87 %  O2 Flow Rate (L/min): 15 L/min   COVID-19 Labs: Recent Labs    02/24/2020 0801 02/26/20 0239  DDIMER 1.53* 1.30*  FERRITIN  --  630*  LDH 328*  --   CRP  --  19.3*    No results found for: BNP  Recent Labs  Lab 03/07/2020 0801  PROCALCITON 0.22    Lab Results  Component Value Date   SARSCOV2NAA POSITIVE (A) 03/12/2020     Prone/Incentive Spirometry: encouraged  incentive spirometry use 3-4/hour, encourage prone position-2-3 hours at a time for up to 15 hours a day.  AKI: Likely hemodynamically mediated-improving with supportive care-avoid nephrotoxic agents.  Follow.  DM-2 (A1c 7.0 on 8/12) with uncontrolled hyperglycemia due to steroids: CBGs remain significantly elevated-add Levemir 10 units twice daily, Fornage of NovoLog with meals-change SSI to resistant scale.  Follow and adjust.  Recent Labs    02/28/2020 2242 02/26/20 0751 02/26/20 1152  GLUCAP 296* 272* 317*   HTN: BP stable-continue Norvasc, metoprolol  HLD: Continue statin  Obesity: Estimated body mass index is 30.25 kg/m as calculated from the following:   Height as of this encounter: 5\' 6"  (1.676 m).   Weight as of this encounter: 85 kg.    GI prophylaxis: PPI  ABG: No results found for: PHART, PCO2ART, PO2ART, HCO3, TCO2, ACIDBASEDEF, O2SAT  Vent Settings: N/A  Condition - Extremely Guarded-very tenuous with risk for further deterioration  Family Communication  :   Son Milta Deiters [phone]) updated over the phone  Code Status :  Full Code  Diet :  Diet Order            Diet heart healthy/carb modified Room service appropriate? Yes; Fluid consistency: Thin  Diet effective now                  Disposition Plan  :   Status is: Inpatient  Remains inpatient appropriate because:Inpatient level of care appropriate due to severity of illness   Dispo: The patient is from: Home              Anticipated d/c is to: TBD              Anticipated d/c date is: > 3 days              Patient currently is not medically stable to d/c.   Barriers to discharge: Hypoxia requiring O2 supplementation/complete 5 days of IV Remdesivir  Antimicorbials  :    Anti-infectives (From admission, onward)   Start     Dose/Rate Route Frequency Ordered Stop   02/26/20 1000  remdesivir 100 mg in sodium chloride 0.9 % 100 mL IVPB       "Followed by" Linked Group Details   100 mg 200 mL/hr over 30 Minutes Intravenous Daily 03/14/2020 1014 03/01/20 0959   03/19/2020 1100  remdesivir 200 mg in sodium chloride 0.9% 250 mL IVPB       "Followed by" Linked Group Details   200 mg 580 mL/hr over 30 Minutes Intravenous Once 02/24/2020 1014 02/24/2020 1327      Inpatient Medications  Scheduled Meds: . amLODipine  5 mg Oral Daily  . enoxaparin (LOVENOX) injection  40 mg Subcutaneous Q24H  . insulin aspart  0-15 Units Subcutaneous TID WC  . insulin aspart  0-5 Units Subcutaneous QHS  . latanoprost  1 drop Both Eyes QHS  . methylPREDNISolone (SOLU-MEDROL) injection  80 mg Intravenous Q12H  . metoprolol tartrate  25 mg Oral BID  .  simvastatin  20 mg Oral QPM  . sodium chloride flush  3 mL Intravenous Q12H  . timolol  1 drop Both Eyes Daily   Continuous Infusions: . sodium chloride    . remdesivir 100 mg in NS 100 mL Stopped (02/26/20 0932)   PRN Meds:.sodium chloride, acetaminophen, albuterol, bisacodyl, chlorpheniramine-HYDROcodone, guaiFENesin-dextromethorphan, ondansetron  **OR** ondansetron (ZOFRAN) IV, oxyCODONE, polyethylene glycol, sodium chloride flush, sodium phosphate   Time Spent in minutes  35   See all Orders from today for further details   Oren Binet M.D on 02/26/2020 at 1:05 PM  To page go to [email or web address] - use universal password  [facility] -  Office  [phone]    Objective:   Vitals:   02/26/20 0556 02/26/20 0755 02/26/20 0800 02/26/20 1148  BP: 120/86 (!) 157/77  (!) 146/67  Pulse: 70 76  67  Resp: 20 20  20   Temp: 98.3 F (36.8 C) 97.6 F (36.4 C)  97.9 F (36.6 C)  TempSrc: Oral Oral  Oral  SpO2: (!) 88% 91%  (!) 87%  Weight:   85 kg   Height:   5\' 6"  (1.676 m)     Wt Readings from Last 3 Encounters:  02/26/20 85 kg  02/04/20 84.5 kg  08/10/19 85.4 kg     Intake/Output Summary (Last 24 hours) at 02/26/2020 1305 Last data filed at 02/26/2020 1100 Gross per 24 hour  Intake 315 ml  Output 275 ml  Net 40 ml     Physical Exam Gen Exam:Alert awake-not in any distress HEENT:atraumatic, normocephalic Chest: B/L clear to auscultation anteriorly CVS:S1S2 regular Abdomen:soft non tender, non distended Extremities:no edema Neurology: Non focal Skin: no rash   Data Review:    CBC Recent Labs  Lab 03/18/2020 0801 02/26/20 0239  WBC 9.2 10.1  HGB 13.3 13.8  HCT 39.3 41.6  PLT 231 255  MCV 88.7 88.1  MCH 30.0 29.2  MCHC 33.8 33.2  RDW 13.4 13.5  LYMPHSABS 1.8 1.2  MONOABS 0.6 0.5  EOSABS 0.0 0.0  BASOSABS 0.0 0.0    Chemistries  Recent Labs  Lab 03/04/2020 0801 02/26/20 0239  NA 136 136  K 4.2 4.5  CL 102 102  CO2 20* 22  GLUCOSE 212* 269*  BUN 40* 38*  CREATININE 1.85* 1.48*  CALCIUM 8.6* 8.6*  MG  --  1.9  AST 68* 90*  ALT 37 44  ALKPHOS 40 47  BILITOT 0.9 1.4*   ------------------------------------------------------------------------------------------------------------------ Recent Labs    03/19/2020 0801  TRIG 106    Lab Results  Component Value Date   HGBA1C 7.0 (H)  02/04/2020   ------------------------------------------------------------------------------------------------------------------ No results for input(s): TSH, T4TOTAL, T3FREE, THYROIDAB in the last 72 hours.  Invalid input(s): FREET3 ------------------------------------------------------------------------------------------------------------------ Recent Labs    02/26/20 0239  FERRITIN 630*    Coagulation profile Recent Labs  Lab 03/03/2020 0801  INR 1.0    Recent Labs    03/22/2020 0801 02/26/20 0239  DDIMER 1.53* 1.30*    Cardiac Enzymes No results for input(s): CKMB, TROPONINI, MYOGLOBIN in the last 168 hours.  Invalid input(s): CK ------------------------------------------------------------------------------------------------------------------ No results found for: BNP  Micro Results Recent Results (from the past 240 hour(s))  SARS Coronavirus 2 by RT PCR (hospital order, performed in [facility] hospital lab) Nasopharyngeal Nasopharyngeal Swab     Status: Abnormal   Collection Time: 03/21/2020  7:55 AM   Specimen: Nasopharyngeal Swab  Result Value Ref Range Status   SARS Coronavirus 2 POSITIVE (A) NEGATIVE Final  Comment: emailed L. Berdik RN 10:00 03/04/2020 ([email or web address]) (NOTE) SARS-CoV-2 target nucleic acids are DETECTED  SARS-CoV-2 RNA is generally detectable in upper respiratory specimens  during the acute phase of infection.  Positive results are indicative  of the presence of the identified virus, but do not rule out bacterial infection or co-infection with other pathogens not detected by the test.  Clinical correlation with patient history and  other diagnostic information is necessary to determine patient infection status.  The expected result is negative.  Fact Sheet for Patients:   StrictlyIdeas.no   Fact Sheet for Healthcare Providers:   BankingDealers.co.za    This test is not yet approved or cleared by the  [place] FDA and  has been authorized for detection and/or diagnosis of SARS-CoV-2 by FDA under an Emergency Use Authorization (EUA).  This EUA will remain in effect (meaning this test can be used) for the duration of  the  COVID-19 declaration under Section 564(b)(1) of the Act, 21 U.S.C. section 360-bbb-3(b)(1), unless the authorization is terminated or revoked sooner.  Performed at [facility], [place] [address], [place], [place] [address]     Radiology Reports DG Chest [place] 1 View  Result Date: 03/03/2020 CLINICAL DATA:  COVID shortness of breath EXAM: PORTABLE CHEST 1 VIEW COMPARISON:  January 14, 2014. FINDINGS: Trachea midline. Cardiomediastinal contours are stable. Interval development of patchy basilar and mid lung predominant opacities in the LEFT and RIGHT chest both interstitial and airspace component. Process more pronounced at the LEFT lung base. Visualized skeletal structures on limited assessment without acute process. No sign of pleural effusion. IMPRESSION: Patchy basilar and mid lung predominant opacities in the LEFT and RIGHT chest both interstitial and airspace component, findings could be seen in the setting of viral or atypical pneumonia, including COVID-19 infection. Electronically Signed   By: Zetta Bills M.D.   On: 03/21/2020 08:12

## 2020-02-26 NOTE — ED Notes (Signed)
This RN called RT to see if they can place pt on Hi FLO O2 as pt 02 Sats remains in the 86-88%

## 2020-02-26 NOTE — Evaluation (Addendum)
Physical Therapy Evaluation Patient Details Name: Brandon Santos MRN: [id number] DOB: 08/25/1933 Today's Date: 02/26/2020   History of Present Illness  86yo male not feeling well for 9-10 days with increasing SOB. Found to be covid positive. Quickly deteriorated and required 15LPM per NRB. PMH DM, HLD, HTN, lumbar fusion  Clinical Impression   Patient received up in recliner, pleasant and cooperative with therapy today, reporting 6/10 SOB at rest. Able to mobilize at a MinA level for functional transfers and to perform dynamic tasks with RW in front of chair. Unfortunately too quickly fatigued and SOB to safely attempt gait today. SOB 8/10 with activity, SPO2 86-88% with activity. Having a hard time understanding that it will be very difficult for him to go home alone right now due to severe impairments in functional mobility/activity tolerance, also he keeps insisting that his son can come in and out of the home to take care of him even if he is Covid positive because "son already had Covid in January". Left up in the chair with all needs met and RN present this morning. Strongly feel that he will require SNF prior to return home due to physical/mobility impairments and safety concerns.     Follow Up Recommendations SNF;Supervision/Assistance - 24 hour    Equipment Recommendations  Rolling walker with 5" wheels;3in1 (PT);Other (comment) (possible WC if mobility tolerance does not significantly improve)    Recommendations for Other Services       Precautions / Restrictions Precautions Precautions: Fall;Other (comment) Precaution Comments: watch sats/HR, covid + Restrictions Weight Bearing Restrictions: No      Mobility  Bed Mobility               General bed mobility comments: OOB in chair  Transfers Overall transfer level: Needs assistance Equipment used: Rolling walker (2 wheeled) Transfers: Sit to/from Stand Sit to Stand: Min assist         General transfer comment:  MinA to boost to upright standing and took a minute with MinA to really gain balance with RW; performed heel raises and forward toe taps but very fatigued and SOB after this  Ambulation/Gait             General Gait Details: deferred- unable to tolerate today  Stairs            Wheelchair Mobility    Modified Rankin (Stroke Patients Only)       Balance Overall balance assessment: Needs assistance Sitting-balance support: Bilateral upper extremity supported;Feet supported Sitting balance-Leahy Scale: Good     Standing balance support: Bilateral upper extremity supported;During functional activity Standing balance-Leahy Scale: Fair Standing balance comment: heavy reliance on BUE support                             Pertinent Vitals/Pain Pain Assessment: No/denies pain    Home Living Family/patient expects to be discharged to:: Private residence Living Arrangements: Spouse/significant other Available Help at Discharge: Family;Available 24 hours/day Type of Home: House Home Access: Stairs to enter Entrance Stairs-Rails: Left Entrance Stairs-Number of Steps: 4 Home Layout: Two level;Bed/bath upstairs Home Equipment: None Additional Comments: very independent, prides himself on being optimistic and having a good attitude    Prior Function Level of Independence: Independent         Comments: wife currently in ICU with covid     Hand Dominance        Extremity/Trunk Assessment   Upper Extremity Assessment Upper Extremity  Assessment: Defer to OT evaluation    Lower Extremity Assessment Lower Extremity Assessment: Generalized weakness    Cervical / Trunk Assessment Cervical / Trunk Assessment: Kyphotic  Communication   Communication: No difficulties  Cognition Arousal/Alertness: Awake/alert Behavior During Therapy: WFL for tasks assessed/performed;Flat affect Overall Cognitive Status: No family/caregiver present to determine baseline  cognitive functioning                                 General Comments: very polite and cooperative; for the most part seemed WNL cognition wise until end of session- could not wrap his head around that he may still be required to isolate even after DC from the hospital and kept insisting his son could come help him      General Comments      Exercises     Assessment/Plan    PT Assessment Patient needs continued PT services  PT Problem List Decreased strength;Decreased cognition;Decreased activity tolerance;Decreased safety awareness;Decreased knowledge of use of DME;Decreased balance;Decreased knowledge of precautions;Decreased mobility;Decreased coordination;Cardiopulmonary status limiting activity       PT Treatment Interventions DME instruction;Balance training;Gait training;Stair training;Cognitive remediation;Functional mobility training;Patient/family education;Therapeutic activities;Therapeutic exercise;Wheelchair mobility training    PT Goals (Current goals can be found in the Care Plan section)  Acute Rehab PT Goals Patient Stated Goal: go home and have son help him PT Goal Formulation: With patient Time For Goal Achievement: 2020-04-05 Potential to Achieve Goals: Fair    Frequency Min 3X/week   Barriers to discharge Decreased caregiver support spouse in ICU with covid    Co-evaluation               AM-PAC PT "6 Clicks" Mobility  Outcome Measure Help needed turning from your back to your side while in a flat bed without using bedrails?: A Little Help needed moving from lying on your back to sitting on the side of a flat bed without using bedrails?: A Little Help needed moving to and from a bed to a chair (including a wheelchair)?: A Little Help needed standing up from a chair using your arms (e.g., wheelchair or bedside chair)?: A Little Help needed to walk in hospital room?: A Lot Help needed climbing 3-5 steps with a railing? : Total 6 Click  Score: 15    End of Session Equipment Utilized During Treatment: Oxygen Activity Tolerance: Patient tolerated treatment well Patient left: in chair;with call bell/phone within reach;with nursing/sitter in room Nurse Communication: Mobility status PT Visit Diagnosis: Unsteadiness on feet (R26.81);Difficulty in walking, not elsewhere classified (R26.2);Muscle weakness (generalized) (M62.81)    Time: 1203-1226 PT Time Calculation (min) (ACUTE ONLY): 23 min   Charges:   PT Evaluation $PT Eval Moderate Complexity: 1 Mod PT Treatments $Therapeutic Activity: 8-22 mins        Windell Norfolk, DPT, PN1   Supplemental Physical Therapist [place]    Pager [phone] Acute Rehab Office [phone]

## 2020-02-27 LAB — BRAIN NATRIURETIC PEPTIDE: B Natriuretic Peptide: 122.6 pg/mL — ABNORMAL HIGH (ref 0.0–100.0)

## 2020-02-27 LAB — C-REACTIVE PROTEIN: CRP: 12.4 mg/dL — ABNORMAL HIGH (ref <1.0)

## 2020-02-27 LAB — CBC WITH DIFFERENTIAL/PLATELET
Abs Immature Granulocytes: 0.17 10*3/uL — ABNORMAL HIGH (ref 0.00–0.07)
Basophils Absolute: 0 10*3/uL (ref 0.0–0.1)
Basophils Relative: 0 %
Eosinophils Absolute: 0 10*3/uL (ref 0.0–0.5)
Eosinophils Relative: 0 %
HCT: 44.4 % (ref 39.0–52.0)
Hemoglobin: 15 g/dL (ref 13.0–17.0)
Immature Granulocytes: 1 %
Lymphocytes Relative: 12 %
Lymphs Abs: 2 10*3/uL (ref 0.7–4.0)
MCH: 29.1 pg (ref 26.0–34.0)
MCHC: 33.8 g/dL (ref 30.0–36.0)
MCV: 86 fL (ref 80.0–100.0)
Monocytes Absolute: 0.8 10*3/uL (ref 0.1–1.0)
Monocytes Relative: 5 %
Neutro Abs: 14.1 10*3/uL — ABNORMAL HIGH (ref 1.7–7.7)
Neutrophils Relative %: 82 %
Platelets: 330 10*3/uL (ref 150–400)
RBC: 5.16 MIL/uL (ref 4.22–5.81)
RDW: 13.5 % (ref 11.5–15.5)
WBC: 17.2 10*3/uL — ABNORMAL HIGH (ref 4.0–10.5)
nRBC: 0 % (ref 0.0–0.2)

## 2020-02-27 LAB — COMPREHENSIVE METABOLIC PANEL
ALT: 47 U/L — ABNORMAL HIGH (ref 0–44)
AST: 79 U/L — ABNORMAL HIGH (ref 15–41)
Albumin: 2.9 g/dL — ABNORMAL LOW (ref 3.5–5.0)
Alkaline Phosphatase: 54 U/L (ref 38–126)
Anion gap: 15 (ref 5–15)
BUN: 49 mg/dL — ABNORMAL HIGH (ref 8–23)
CO2: 19 mmol/L — ABNORMAL LOW (ref 22–32)
Calcium: 8.9 mg/dL (ref 8.9–10.3)
Chloride: 103 mmol/L (ref 98–111)
Creatinine, Ser: 1.6 mg/dL — ABNORMAL HIGH (ref 0.61–1.24)
GFR calc Af Amer: 45 mL/min — ABNORMAL LOW (ref >60)
GFR calc non Af Amer: 39 mL/min — ABNORMAL LOW (ref >60)
Glucose, Bld: 201 mg/dL — ABNORMAL HIGH (ref 70–99)
Potassium: 4.6 mmol/L (ref 3.5–5.1)
Sodium: 137 mmol/L (ref 135–145)
Total Bilirubin: 1 mg/dL (ref 0.3–1.2)
Total Protein: 6.9 g/dL (ref 6.5–8.1)

## 2020-02-27 LAB — MAGNESIUM: Magnesium: 2.3 mg/dL (ref 1.7–2.4)

## 2020-02-27 LAB — GLUCOSE, CAPILLARY
Glucose-Capillary: 198 mg/dL — ABNORMAL HIGH (ref 70–99)
Glucose-Capillary: 285 mg/dL — ABNORMAL HIGH (ref 70–99)
Glucose-Capillary: 272 mg/dL — ABNORMAL HIGH (ref 70–99)
Glucose-Capillary: 214 mg/dL — ABNORMAL HIGH (ref 70–99)

## 2020-02-27 LAB — FERRITIN: Ferritin: 885 ng/mL — ABNORMAL HIGH (ref 24–336)

## 2020-02-27 LAB — D-DIMER, QUANTITATIVE: D-Dimer, Quant: 1.31 ug/mL-FEU — ABNORMAL HIGH (ref 0.00–0.50)

## 2020-02-27 MED ORDER — INSULIN ASPART 100 UNIT/ML ~~LOC~~ SOLN
0.0000 [IU] | Freq: Three times a day (TID) | SUBCUTANEOUS | Status: DC
  Administered 2020-02-27 (×2): 11 [IU] via SUBCUTANEOUS
  Administered 2020-02-28: 7 [IU] via SUBCUTANEOUS
  Administered 2020-02-28 (×2): 11 [IU] via SUBCUTANEOUS
  Administered 2020-02-29: 7 [IU] via SUBCUTANEOUS
  Administered 2020-02-29 – 2020-03-01 (×2): 4 [IU] via SUBCUTANEOUS
  Administered 2020-03-01: 7 [IU] via SUBCUTANEOUS
  Administered 2020-03-02: 15 [IU] via SUBCUTANEOUS
  Administered 2020-03-02: 11 [IU] via SUBCUTANEOUS
  Administered 2020-03-03: 7 [IU] via SUBCUTANEOUS
  Administered 2020-03-03: 3 [IU] via SUBCUTANEOUS
  Administered 2020-03-04: 5 [IU] via SUBCUTANEOUS
  Administered 2020-03-04: 7 [IU] via SUBCUTANEOUS
  Administered 2020-03-05: 3 [IU] via SUBCUTANEOUS
  Administered 2020-03-05: 4 [IU] via SUBCUTANEOUS
  Administered 2020-03-05: 7 [IU] via SUBCUTANEOUS
  Administered 2020-03-06 (×2): 4 [IU] via SUBCUTANEOUS
  Administered 2020-03-06: 7 [IU] via SUBCUTANEOUS
  Administered 2020-03-07: 4 [IU] via SUBCUTANEOUS
  Administered 2020-03-07 (×2): 7 [IU] via SUBCUTANEOUS
  Administered 2020-03-08: 4 [IU] via SUBCUTANEOUS

## 2020-02-27 NOTE — Progress Notes (Addendum)
PROGRESS NOTE                                                                                                                                                                                                             Patient Demographics:    Brandon Santos, is a 86 y.o. male, DOB - 21-Mar-1934, LXB:[id number]  Outpatient Primary MD for the patient is Vivi Barrack, MD   Admit date - 02/27/2020   LOS - 2  Chief Complaint  Patient presents with   Shortness of Breath       Brief Narrative: Patient is a 86 y.o. male with PMHx of HTN, HLD, DM-2-who was not feeling well for the past 9-10 days-presented to the ED with several days of worsening shortness of breath.  He was found to have acute hypoxic respiratory failure secondary to COVID-19 pneumonia and admitted to the hospitalist service.  Post admission-he deteriorated with worsening oxygen requirement-and was placed on 15 L HFNC with NRB.  COVID-19 vaccinated status: Unvaccinated  Significant Events: 9/2>> Admit to [facility] for for severe hypoxemia secondary to COVID-19 pneumonia  Significant studies: 9/2>>Chest x-ray: Patchy bilateral lung infiltrates  COVID-19 medications: Steroids: 9/2>> Remdesivir: 9/2>> Actemra: 9/3 x 1  Antibiotics: None  Microbiology data: None  Procedures: None  Consults: None  DVT prophylaxis: Prophylactic Lovenox-at twice daily dosing.    Subjective:   He feels better-but remains on 15 L HFNC and NRB.  No major issues overnight.   Assessment  & Plan :   Acute Hypoxic Resp Failure due to Covid 19 Viral pneumonia: Remains unchanged-continues to have severe hypoxemia-on 15 L HFNC and NRB.  Continue high-dose Solu-Medrol and Remdesivir.  S/p Actemra on 9/3.  Although he is severely hypoxemic-he appears comfortable-if he worsens-he will require transfer to the ICU.  We had a long discussion with patient/son yesterday regarding  end-of-life issues-he wishes to continue full scope of treatment-remains a full code.  We will continue discussions over the next few days.  Fever: afebrile O2 requirements:  SpO2: 91 % O2 Flow Rate (L/min): 15 L/min   COVID-19 Labs: Recent Labs    03/03/2020 0801 02/26/20 0239 02/27/20 0536  DDIMER 1.53* 1.30* 1.31*  FERRITIN  --  630* 885*  LDH 328*  --   --   CRP  --  19.3* 12.4*  Component Value Date/Time   BNP 122.6 (H) 02/27/2020 0536    Recent Labs  Lab 03/10/2020 0801  PROCALCITON 0.22    Lab Results  Component Value Date   SARSCOV2NAA POSITIVE (A) 03/23/2020     Prone/Incentive Spirometry: encouraged  incentive spirometry use 3-4/hour, encourage prone position-2-3 hours at a time for up to 15 hours a day.  AKI: Likely hemodynamically mediated-continues to have mild elevation in creatinine-check UA, check frequent bladder scans.  If does not improve-we can consider further work-up.  DM-2 (A1c 7.0 on 8/12) with uncontrolled hyperglycemia due to steroids: CBGs remain relatively stable-continue Levemir 10 units twice daily, 4 units of NovoLog with meals and resistant SSI.  Follow and adjust.    Recent Labs    02/26/20 1619 02/26/20 2115 02/27/20 0725  GLUCAP 310* 194* 198*   HTN: BP stable-continue Norvasc, metoprolol  HLD: Continue statin  Deconditioning/debility: Secondary to acute illness-PT following-recommendations are for SNF.  Obesity: Estimated body mass index is 30.25 kg/m as calculated from the following:   Height as of this encounter: 5\' 6"  (1.676 m).   Weight as of this encounter: 85 kg.    GI prophylaxis: PPI  ABG: No results found for: PHART, PCO2ART, PO2ART, HCO3, TCO2, ACIDBASEDEF, O2SAT  Vent Settings: N/A  Condition - Extremely Guarded-very tenuous with risk for further deterioration  Family Communication  :  Son Milta Deiters [phone]) - left voicemail on 9/4  Addendum: spoke to son this afternoon  Code Status :  Full  Code  Diet :  Diet Order            Diet heart healthy/carb modified Room service appropriate? Yes; Fluid consistency: Thin  Diet effective now                  Disposition Plan  :   Status is: Inpatient  Remains inpatient appropriate because:Inpatient level of care appropriate due to severity of illness   Dispo: The patient is from: Home              Anticipated d/c is to: TBD              Anticipated d/c date is: > 3 days              Patient currently is not medically stable to d/c.   Barriers to discharge: Hypoxia requiring O2 supplementation/complete 5 days of IV Remdesivir  Antimicorbials  :    Anti-infectives (From admission, onward)   Start     Dose/Rate Route Frequency Ordered Stop   02/26/20 1000  remdesivir 100 mg in sodium chloride 0.9 % 100 mL IVPB       "Followed by" Linked Group Details   100 mg 200 mL/hr over 30 Minutes Intravenous Daily 02/29/2020 1014 03/01/20 0959   03/09/2020 1100  remdesivir 200 mg in sodium chloride 0.9% 250 mL IVPB       "Followed by" Linked Group Details   200 mg 580 mL/hr over 30 Minutes Intravenous Once 03/13/2020 1014 03/23/2020 1327      Inpatient Medications  Scheduled Meds:  amLODipine  5 mg Oral Daily   enoxaparin (LOVENOX) injection  40 mg Subcutaneous Q12H   insulin aspart  0-15 Units Subcutaneous TID WC   insulin aspart  0-5 Units Subcutaneous QHS   insulin aspart  4 Units Subcutaneous TID WC   insulin detemir  10 Units Subcutaneous BID   latanoprost  1 drop Both Eyes QHS   methylPREDNISolone (SOLU-MEDROL) injection  80 mg Intravenous Q12H   metoprolol tartrate  25 mg Oral BID   pantoprazole  40 mg Oral Q1200   simvastatin  20 mg Oral QPM   sodium chloride flush  3 mL Intravenous Q12H   timolol  1 drop Both Eyes Daily   Continuous Infusions:  sodium chloride     remdesivir 100 mg in NS 100 mL 100 mg (02/27/20 1049)   PRN Meds:.sodium chloride, acetaminophen, albuterol, bisacodyl,  chlorpheniramine-HYDROcodone, guaiFENesin-dextromethorphan, ondansetron **OR** ondansetron (ZOFRAN) IV, oxyCODONE, polyethylene glycol, sodium chloride flush, sodium phosphate   Time Spent in minutes  35   See all Orders from today for further details   Oren Binet M.D on 02/27/2020 at 11:47 AM  To page go to [email or web address] - use universal password  [facility] -  Office  [phone]    Objective:   Vitals:   02/26/20 2037 02/26/20 2039 02/27/20 0352 02/27/20 0721  BP:  140/72 (!) 154/67 (!) 144/51  Pulse: 79 82 61 66  Resp:  (!) 23 (!) 22 20  Temp:  (!) 97.5 F (36.4 C)  98.8 F (37.1 C)  TempSrc: Oral Axillary Oral Oral  SpO2:  90% 90% 91%  Weight:      Height:        Wt Readings from Last 3 Encounters:  02/26/20 85 kg  02/04/20 84.5 kg  08/10/19 85.4 kg     Intake/Output Summary (Last 24 hours) at 02/27/2020 1147 Last data filed at 02/27/2020 0905 Gross per 24 hour  Intake 390 ml  Output 725 ml  Net -335 ml     Physical Exam Gen Exam:Alert awake-not in any distress HEENT:atraumatic, normocephalic Chest: B/L clear to auscultation anteriorly CVS:S1S2 regular Abdomen:soft non tender, non distended Extremities:no edema Neurology: Non focal Skin: no rash  Data Review:    CBC Recent Labs  Lab 03/23/2020 0801 02/26/20 0239 02/27/20 0536  WBC 9.2 10.1 17.2*  HGB 13.3 13.8 15.0  HCT 39.3 41.6 44.4  PLT 231 255 330  MCV 88.7 88.1 86.0  MCH 30.0 29.2 29.1  MCHC 33.8 33.2 33.8  RDW 13.4 13.5 13.5  LYMPHSABS 1.8 1.2 2.0  MONOABS 0.6 0.5 0.8  EOSABS 0.0 0.0 0.0  BASOSABS 0.0 0.0 0.0    Chemistries  Recent Labs  Lab 03/08/2020 0801 02/26/20 0239 02/27/20 0536  NA 136 136 137  K 4.2 4.5 4.6  CL 102 102 103  CO2 20* 22 19*  GLUCOSE 212* 269* 201*  BUN 40* 38* 49*  CREATININE 1.85* 1.48* 1.60*  CALCIUM 8.6* 8.6* 8.9  MG  --  1.9 2.3  AST 68* 90* 79*  ALT 37 44 47*  ALKPHOS 40 47 54  BILITOT 0.9 1.4* 1.0    ------------------------------------------------------------------------------------------------------------------ Recent Labs    03/22/2020 0801  TRIG 106    Lab Results  Component Value Date   HGBA1C 7.0 (H) 02/04/2020   ------------------------------------------------------------------------------------------------------------------ No results for input(s): TSH, T4TOTAL, T3FREE, THYROIDAB in the last 72 hours.  Invalid input(s): FREET3 ------------------------------------------------------------------------------------------------------------------ Recent Labs    02/26/20 0239 02/27/20 0536  FERRITIN 630* 885*    Coagulation profile Recent Labs  Lab 03/20/2020 0801  INR 1.0    Recent Labs    02/26/20 0239 02/27/20 0536  DDIMER 1.30* 1.31*    Cardiac Enzymes No results for input(s): CKMB, TROPONINI, MYOGLOBIN in the last 168 hours.  Invalid input(s): CK ------------------------------------------------------------------------------------------------------------------    Component Value Date/Time   BNP 122.6 (H) 02/27/2020 9326    Micro Results Recent Results (  from the past 240 hour(s))  SARS Coronavirus 2 by RT PCR (hospital order, performed in [facility] hospital lab) Nasopharyngeal Nasopharyngeal Swab     Status: Abnormal   Collection Time: 03/06/2020  7:55 AM   Specimen: Nasopharyngeal Swab  Result Value Ref Range Status   SARS Coronavirus 2 POSITIVE (A) NEGATIVE Final    Comment: emailed L. Berdik RN 10:00 03/22/2020 ([email or web address]) (NOTE) SARS-CoV-2 target nucleic acids are DETECTED  SARS-CoV-2 RNA is generally detectable in upper respiratory specimens  during the acute phase of infection.  Positive results are indicative  of the presence of the identified virus, but do not rule out bacterial infection or co-infection with other pathogens not detected by the test.  Clinical correlation with patient history and  other diagnostic information is necessary to  determine patient infection status.  The expected result is negative.  Fact Sheet for Patients:   StrictlyIdeas.no   Fact Sheet for Healthcare Providers:   BankingDealers.co.za    This test is not yet approved or cleared by the [place] FDA and  has been authorized for detection and/or diagnosis of SARS-CoV-2 by FDA under an Emergency Use Authorization (EUA).  This EUA will remain in effect (meaning this test can be used) for the duration of  the  COVID-19 declaration under Section 564(b)(1) of the Act, 21 U.S.C. section 360-bbb-3(b)(1), unless the authorization is terminated or revoked sooner.  Performed at [facility], [place] [address] [place], [place] [address]     Radiology Reports DG Chest [place] 1 View  Result Date: 03/02/2020 CLINICAL DATA:  COVID shortness of breath EXAM: PORTABLE CHEST 1 VIEW COMPARISON:  January 14, 2014. FINDINGS: Trachea midline. Cardiomediastinal contours are stable. Interval development of patchy basilar and mid lung predominant opacities in the LEFT and RIGHT chest both interstitial and airspace component. Process more pronounced at the LEFT lung base. Visualized skeletal structures on limited assessment without acute process. No sign of pleural effusion. IMPRESSION: Patchy basilar and mid lung predominant opacities in the LEFT and RIGHT chest both interstitial and airspace component, findings could be seen in the setting of viral or atypical pneumonia, including COVID-19 infection. Electronically Signed   By: Zetta Bills M.D.   On: 02/29/2020 08:12

## 2020-02-27 NOTE — Plan of Care (Addendum)
  Problem: Education: Goal: Knowledge of General Education information will improve Description: Including pain rating scale, medication(s)/side effects and non-pharmacologic comfort measures Outcome: Progressing   Problem: Health Behavior/Discharge Planning: Goal: Ability to manage health-related needs will improve Outcome: Progressing   Problem: Clinical Measurements: Goal: Ability to maintain clinical measurements within normal limits will improve Outcome: Progressing Goal: Will remain free from infection Outcome: Progressing Goal: Diagnostic test results will improve Outcome: Progressing Goal: Respiratory complications will improve Outcome: Progressing Goal: Cardiovascular complication will be avoided Outcome: Progressing   Problem: Activity: Goal: Risk for activity intolerance will decrease Outcome: Progressing   Problem: Nutrition: Goal: Adequate nutrition will be maintained Outcome: Progressing   Problem: Coping: Goal: Level of anxiety will decrease Outcome: Progressing   Problem: Elimination: Goal: Will not experience complications related to bowel motility Outcome: Progressing Goal: Will not experience complications related to urinary retention Outcome: Progressing   Problem: Pain Managment: Goal: General experience of comfort will improve Outcome: Progressing   Problem: Safety: Goal: Ability to remain free from injury will improve Outcome: Progressing   Problem: Skin Integrity: Goal: Risk for impaired skin integrity will decrease Outcome: Progressing   Problem: Education: Goal: Ability to describe self-care measures that may prevent or decrease complications (Diabetes Survival Skills Education) will improve Outcome: Progressing Goal: Individualized Educational Video(s) Outcome: Progressing   Problem: Coping: Goal: Ability to adjust to condition or change in health will improve Outcome: Progressing   Problem: Fluid Volume: Goal: Ability to  maintain a balanced intake and output will improve Outcome: Progressing   Problem: Health Behavior/Discharge Planning: Goal: Ability to identify and utilize available resources and services will improve Outcome: Progressing Goal: Ability to manage health-related needs will improve Outcome: Progressing   Problem: Metabolic: Goal: Ability to maintain appropriate glucose levels will improve Outcome: Progressing   Problem: Nutritional: Goal: Maintenance of adequate nutrition will improve Outcome: Progressing Goal: Progress toward achieving an optimal weight will improve Outcome: Progressing   Problem: Skin Integrity: Goal: Risk for impaired skin integrity will decrease Outcome: Progressing   Problem: Tissue Perfusion: Goal: Adequacy of tissue perfusion will improve Outcome: Progressing  Patient AxOx4, vss patient successfully proned for 2 hours on 15 liters HF, no urine output bladder scan 107, notified md no new orders at this time encouraged fluids

## 2020-02-27 NOTE — Progress Notes (Signed)
Notified son of progress.  All questions were answered and this nurse's contact number shared for further communication.

## 2020-02-27 NOTE — Progress Notes (Signed)
Bathed patient and changed gown and linen, patient now in prone position. Sats 92% on 15L HFNC only

## 2020-02-28 ENCOUNTER — Inpatient Hospital Stay (HOSPITAL_COMMUNITY): Payer: BC Managed Care – PPO

## 2020-02-28 LAB — MAGNESIUM: Magnesium: 2.2 mg/dL (ref 1.7–2.4)

## 2020-02-28 LAB — COMPREHENSIVE METABOLIC PANEL
ALT: 41 U/L (ref 0–44)
AST: 59 U/L — ABNORMAL HIGH (ref 15–41)
Albumin: 2.6 g/dL — ABNORMAL LOW (ref 3.5–5.0)
Alkaline Phosphatase: 49 U/L (ref 38–126)
Anion gap: 12 (ref 5–15)
BUN: 58 mg/dL — ABNORMAL HIGH (ref 8–23)
CO2: 20 mmol/L — ABNORMAL LOW (ref 22–32)
Calcium: 8.7 mg/dL — ABNORMAL LOW (ref 8.9–10.3)
Chloride: 107 mmol/L (ref 98–111)
Creatinine, Ser: 1.66 mg/dL — ABNORMAL HIGH (ref 0.61–1.24)
GFR calc Af Amer: 44 mL/min — ABNORMAL LOW (ref >60)
GFR calc non Af Amer: 38 mL/min — ABNORMAL LOW (ref >60)
Glucose, Bld: 232 mg/dL — ABNORMAL HIGH (ref 70–99)
Potassium: 4.5 mmol/L (ref 3.5–5.1)
Sodium: 139 mmol/L (ref 135–145)
Total Bilirubin: 0.9 mg/dL (ref 0.3–1.2)
Total Protein: 6.3 g/dL — ABNORMAL LOW (ref 6.5–8.1)

## 2020-02-28 LAB — CBC WITH DIFFERENTIAL/PLATELET
Abs Immature Granulocytes: 0.11 10*3/uL — ABNORMAL HIGH (ref 0.00–0.07)
Basophils Absolute: 0 10*3/uL (ref 0.0–0.1)
Basophils Relative: 0 %
Eosinophils Absolute: 0 10*3/uL (ref 0.0–0.5)
Eosinophils Relative: 0 %
HCT: 42.5 % (ref 39.0–52.0)
Hemoglobin: 14.1 g/dL (ref 13.0–17.0)
Immature Granulocytes: 1 %
Lymphocytes Relative: 8 %
Lymphs Abs: 1.3 10*3/uL (ref 0.7–4.0)
MCH: 28.9 pg (ref 26.0–34.0)
MCHC: 33.2 g/dL (ref 30.0–36.0)
MCV: 87.1 fL (ref 80.0–100.0)
Monocytes Absolute: 0.7 10*3/uL (ref 0.1–1.0)
Monocytes Relative: 5 %
Neutro Abs: 13.9 10*3/uL — ABNORMAL HIGH (ref 1.7–7.7)
Neutrophils Relative %: 86 %
Platelets: 285 10*3/uL (ref 150–400)
RBC: 4.88 MIL/uL (ref 4.22–5.81)
RDW: 13.4 % (ref 11.5–15.5)
WBC: 16.1 10*3/uL — ABNORMAL HIGH (ref 4.0–10.5)
nRBC: 0 % (ref 0.0–0.2)

## 2020-02-28 LAB — URINALYSIS, ROUTINE W REFLEX MICROSCOPIC
Bilirubin Urine: NEGATIVE
Glucose, UA: 50 mg/dL — AB
Hgb urine dipstick: NEGATIVE
Ketones, ur: NEGATIVE mg/dL
Leukocytes,Ua: NEGATIVE
Nitrite: NEGATIVE
Protein, ur: 30 mg/dL — AB
Specific Gravity, Urine: 1.024 (ref 1.005–1.030)
pH: 5 (ref 5.0–8.0)

## 2020-02-28 LAB — FERRITIN: Ferritin: 825 ng/mL — ABNORMAL HIGH (ref 24–336)

## 2020-02-28 LAB — GLUCOSE, CAPILLARY
Glucose-Capillary: 237 mg/dL — ABNORMAL HIGH (ref 70–99)
Glucose-Capillary: 283 mg/dL — ABNORMAL HIGH (ref 70–99)
Glucose-Capillary: 287 mg/dL — ABNORMAL HIGH (ref 70–99)
Glucose-Capillary: 212 mg/dL — ABNORMAL HIGH (ref 70–99)

## 2020-02-28 LAB — C-REACTIVE PROTEIN: CRP: 6 mg/dL — ABNORMAL HIGH (ref <1.0)

## 2020-02-28 LAB — D-DIMER, QUANTITATIVE: D-Dimer, Quant: 0.84 ug/mL-FEU — ABNORMAL HIGH (ref 0.00–0.50)

## 2020-02-28 MED ORDER — INSULIN DETEMIR 100 UNIT/ML ~~LOC~~ SOLN
14.0000 [IU] | Freq: Two times a day (BID) | SUBCUTANEOUS | Status: DC
  Administered 2020-02-28 – 2020-03-03 (×9): 14 [IU] via SUBCUTANEOUS
  Filled 2020-02-28 (×10): qty 0.14

## 2020-02-28 MED ORDER — METHYLPREDNISOLONE SODIUM SUCC 125 MG IJ SOLR
60.0000 mg | Freq: Two times a day (BID) | INTRAMUSCULAR | Status: DC
  Administered 2020-02-28 – 2020-03-01 (×5): 60 mg via INTRAVENOUS
  Filled 2020-02-28 (×5): qty 2

## 2020-02-28 MED ORDER — INSULIN ASPART 100 UNIT/ML ~~LOC~~ SOLN
6.0000 [IU] | Freq: Three times a day (TID) | SUBCUTANEOUS | Status: DC
  Administered 2020-02-28 – 2020-03-02 (×7): 6 [IU] via SUBCUTANEOUS

## 2020-02-28 NOTE — Progress Notes (Signed)
Notified SON of progress overnight.  All questions were answered.

## 2020-02-28 NOTE — Progress Notes (Addendum)
Patient slept in prone positions tilting to each side overnight, oxygen requirements were 10L HFNC only at this time.  Upon turning to the back and activity, patient requires 15L HFNC and NRB  Patient able to stand at the side of the bed to use the urinal.  Requiring 15 L HFNC and NRB. sats 83-86% Placed patient back to bed on left side sats recovered to 92%

## 2020-02-28 NOTE — Progress Notes (Addendum)
PROGRESS NOTE                                                                                                                                                                                                             Patient Demographics:    Brandon Santos, is a 86 y.o. male, DOB - 05-02-1934, ZTI:[id number]  Outpatient Primary MD for the patient is Vivi Barrack, MD   Admit date - 03/12/2020   LOS - 3  Chief Complaint  Patient presents with  . Shortness of Breath       Brief Narrative: Patient is a 86 y.o. male with PMHx of HTN, HLD, DM-2-who was not feeling well for the past 9-10 days-presented to the ED with several days of worsening shortness of breath.  He was found to have acute hypoxic respiratory failure secondary to COVID-19 pneumonia and admitted to the hospitalist service.  Post admission-he deteriorated with worsening oxygen requirement-and was placed on 15 L HFNC with NRB.  COVID-19 vaccinated status: Unvaccinated  Significant Events: 9/2>> Admit to [facility] for for severe hypoxemia secondary to COVID-19 pneumonia  Significant studies: 9/2>>Chest x-ray: Patchy bilateral lung infiltrates  COVID-19 medications: Steroids: 9/2>> Remdesivir: 9/2>> Actemra: 9/3 x 1  Antibiotics: None  Microbiology data: None  Procedures: None  Consults: None  DVT prophylaxis: Prophylactic Lovenox-at twice daily dosing.    Subjective:   No major events overnight-stable on 15 L of HFNC-did not require NRB this morning.  He feels better-does not have any chest pain-does not feel short of breath at rest.   Assessment  & Plan :   Acute Hypoxic Resp Failure due to Covid 19 Viral pneumonia: Minimally improved-still very hypoxic and on 15 L of HFNC-but not requiring NRB this morning.  Continue steroids but taper slightly-remains on Remdesivir.  Although he is severely hypoxemic-he appears comfortable.  Continue to  monitor closely.   Fever: afebrile O2 requirements:  SpO2: 93 % O2 Flow Rate (L/min): 15 L/min   COVID-19 Labs: Recent Labs    02/26/20 0239 02/27/20 0536 02/28/20 0255  DDIMER 1.30* 1.31* 0.84*  FERRITIN 630* 885* 825*  CRP 19.3* 12.4* 6.0*       Component Value Date/Time   BNP 122.6 (H) 02/27/2020 0536    Recent Labs  Lab 03/18/2020 0801  PROCALCITON 0.22    Lab Results  Component Value Date   SARSCOV2NAA POSITIVE (A) 03/08/2020     Prone/Incentive Spirometry: encouraged  incentive spirometry use 3-4/hour, encourage prone position-2-3 hours at a time for up to 15 hours a day.  AKI: Likely hemodynamically mediated-continues to have some amount of AKI-slightly worse than yesterday-no significant residual urine on bladder scans.  Continue to avoid nephrotoxic agents-encourage oral intake.  Follow closely.  Awaiting UA and renal ultrasound.  DM-2 (A1c 7.0 on 8/12) with uncontrolled hyperglycemia due to steroids: CBGs remain uncontrolled-increase Levemir to 14 units twice daily, increase Premeal NovoLog to 6 units-continue resistant SSI.  Follow and adjust.    Recent Labs    02/27/20 2136 02/28/20 0742 02/28/20 1202  GLUCAP 214* 237* 283*   HTN: BP stable-continue Norvasc, metoprolol  HLD: Continue statin  Deconditioning/debility: Secondary to acute illness-PT following-recommendations are for SNF.  Obesity: Estimated body mass index is 30.25 kg/m as calculated from the following:   Height as of this encounter: 5\' 6"  (1.676 m).   Weight as of this encounter: 85 kg.    GI prophylaxis: PPI  ABG: No results found for: PHART, PCO2ART, PO2ART, HCO3, TCO2, ACIDBASEDEF, O2SAT  Vent Settings: N/A  Condition - Extremely Guarded-very tenuous with risk for further deterioration  Family Communication  :  Son Milta Deiters [phone]) - on 9/5  Code Status :  Full Code  Diet :  Diet Order            Diet Carb Modified Fluid consistency: Thin; Room service  appropriate? Yes  Diet effective now                  Disposition Plan  :   Status is: Inpatient  Remains inpatient appropriate because:Inpatient level of care appropriate due to severity of illness   Dispo: The patient is from: Home              Anticipated d/c is to: TBD              Anticipated d/c date is: > 3 days              Patient currently is not medically stable to d/c.   Barriers to discharge: Hypoxia requiring O2 supplementation/complete 5 days of IV Remdesivir  Antimicorbials  :    Anti-infectives (From admission, onward)   Start     Dose/Rate Route Frequency Ordered Stop   02/26/20 1000  remdesivir 100 mg in sodium chloride 0.9 % 100 mL IVPB       "Followed by" Linked Group Details   100 mg 200 mL/hr over 30 Minutes Intravenous Daily 03/15/2020 1014 02/28/20 1051   03/16/2020 1100  remdesivir 200 mg in sodium chloride 0.9% 250 mL IVPB       "Followed by" Linked Group Details   200 mg 580 mL/hr over 30 Minutes Intravenous Once 03/10/2020 1014 02/29/2020 1327      Inpatient Medications  Scheduled Meds: . amLODipine  5 mg Oral Daily  . enoxaparin (LOVENOX) injection  40 mg Subcutaneous Q12H  . insulin aspart  0-20 Units Subcutaneous TID WC  . insulin aspart  0-5 Units Subcutaneous QHS  . insulin aspart  4 Units Subcutaneous TID WC  . insulin detemir  10 Units Subcutaneous BID  . latanoprost  1 drop Both Eyes QHS  . methylPREDNISolone (SOLU-MEDROL) injection  60 mg Intravenous Q12H  . metoprolol tartrate  25 mg Oral BID  . pantoprazole  40 mg Oral Q1200  . simvastatin  20 mg Oral QPM  .  sodium chloride flush  3 mL Intravenous Q12H  . timolol  1 drop Both Eyes Daily   Continuous Infusions: . sodium chloride     PRN Meds:.sodium chloride, acetaminophen, albuterol, bisacodyl, chlorpheniramine-HYDROcodone, guaiFENesin-dextromethorphan, ondansetron **OR** ondansetron (ZOFRAN) IV, oxyCODONE, polyethylene glycol, sodium chloride flush, sodium phosphate   Time  Spent in minutes  35   See all Orders from today for further details   Oren Binet M.D on 02/28/2020 at 12:48 PM  To page go to [email or web address] - use universal password  [facility] -  Office  [phone]    Objective:   Vitals:   02/28/20 0445 02/28/20 0745 02/28/20 1005 02/28/20 1203  BP:  (!) 140/44 136/70 (!) 143/79  Pulse:  65 70 64  Resp:  18  19  Temp:  97.6 F (36.4 C)  97.9 F (36.6 C)  TempSrc:  Oral  Oral  SpO2: 90% 92%  93%  Weight:      Height:        Wt Readings from Last 3 Encounters:  02/26/20 85 kg  02/04/20 84.5 kg  08/10/19 85.4 kg     Intake/Output Summary (Last 24 hours) at 02/28/2020 1248 Last data filed at 02/28/2020 0700 Gross per 24 hour  Intake --  Output 1100 ml  Net -1100 ml     Physical Exam Gen Exam:Alert awake-not in any distress HEENT:atraumatic, normocephalic Chest: B/L clear to auscultation anteriorly CVS:S1S2 regular Abdomen:soft non tender, non distended Extremities:no edema Neurology: Non focal Skin: no rash   Data Review:    CBC Recent Labs  Lab 03/03/2020 0801 02/26/20 0239 02/27/20 0536 02/28/20 0255  WBC 9.2 10.1 17.2* 16.1*  HGB 13.3 13.8 15.0 14.1  HCT 39.3 41.6 44.4 42.5  PLT 231 255 330 285  MCV 88.7 88.1 86.0 87.1  MCH 30.0 29.2 29.1 28.9  MCHC 33.8 33.2 33.8 33.2  RDW 13.4 13.5 13.5 13.4  LYMPHSABS 1.8 1.2 2.0 1.3  MONOABS 0.6 0.5 0.8 0.7  EOSABS 0.0 0.0 0.0 0.0  BASOSABS 0.0 0.0 0.0 0.0    Chemistries  Recent Labs  Lab 03/24/2020 0801 02/26/20 0239 02/27/20 0536 02/28/20 0255  NA 136 136 137 139  K 4.2 4.5 4.6 4.5  CL 102 102 103 107  CO2 20* 22 19* 20*  GLUCOSE 212* 269* 201* 232*  BUN 40* 38* 49* 58*  CREATININE 1.85* 1.48* 1.60* 1.66*  CALCIUM 8.6* 8.6* 8.9 8.7*  MG  --  1.9 2.3 2.2  AST 68* 90* 79* 59*  ALT 37 44 47* 41  ALKPHOS 40 47 54 49  BILITOT 0.9 1.4* 1.0 0.9    ------------------------------------------------------------------------------------------------------------------ No results for input(s): CHOL, HDL, LDLCALC, TRIG, CHOLHDL, LDLDIRECT in the last 72 hours.  Lab Results  Component Value Date   HGBA1C 7.0 (H) 02/04/2020   ------------------------------------------------------------------------------------------------------------------ No results for input(s): TSH, T4TOTAL, T3FREE, THYROIDAB in the last 72 hours.  Invalid input(s): FREET3 ------------------------------------------------------------------------------------------------------------------ Recent Labs    02/27/20 0536 02/28/20 0255  FERRITIN 885* 825*    Coagulation profile Recent Labs  Lab 03/12/2020 0801  INR 1.0    Recent Labs    02/27/20 0536 02/28/20 0255  DDIMER 1.31* 0.84*    Cardiac Enzymes No results for input(s): CKMB, TROPONINI, MYOGLOBIN in the last 168 hours.  Invalid input(s): CK ------------------------------------------------------------------------------------------------------------------    Component Value Date/Time   BNP 122.6 (H) 02/27/2020 0536    Micro Results Recent Results (from the past 240 hour(s))  SARS Coronavirus 2 by RT PCR (hospital order, performed  in [place] lab) Nasopharyngeal Nasopharyngeal Swab     Status: Abnormal   Collection Time: 02/26/2020  7:55 AM   Specimen: Nasopharyngeal Swab  Result Value Ref Range Status   SARS Coronavirus 2 POSITIVE (A) NEGATIVE Final    Comment: emailed L. Berdik RN 10:00 03/09/2020 ([email or web address]) (NOTE) SARS-CoV-2 target nucleic acids are DETECTED  SARS-CoV-2 RNA is generally detectable in upper respiratory specimens  during the acute phase of infection.  Positive results are indicative  of the presence of the identified virus, but do not rule out bacterial infection or co-infection with other pathogens not detected by the test.  Clinical correlation with patient history and   other diagnostic information is necessary to determine patient infection status.  The expected result is negative.  Fact Sheet for Patients:   StrictlyIdeas.no   Fact Sheet for Healthcare Providers:   BankingDealers.co.za    This test is not yet approved or cleared by the [place] FDA and  has been authorized for detection and/or diagnosis of SARS-CoV-2 by FDA under an Emergency Use Authorization (EUA).  This EUA will remain in effect (meaning this test can be used) for the duration of  the  COVID-19 declaration under Section 564(b)(1) of the Act, 21 U.S.C. section 360-bbb-3(b)(1), unless the authorization is terminated or revoked sooner.  Performed at [facility], [place] [address] [place], [place] [address]     Radiology Reports DG Chest [place] 1 View  Result Date: 03/21/2020 CLINICAL DATA:  COVID shortness of breath EXAM: PORTABLE CHEST 1 VIEW COMPARISON:  January 14, 2014. FINDINGS: Trachea midline. Cardiomediastinal contours are stable. Interval development of patchy basilar and mid lung predominant opacities in the LEFT and RIGHT chest both interstitial and airspace component. Process more pronounced at the LEFT lung base. Visualized skeletal structures on limited assessment without acute process. No sign of pleural effusion. IMPRESSION: Patchy basilar and mid lung predominant opacities in the LEFT and RIGHT chest both interstitial and airspace component, findings could be seen in the setting of viral or atypical pneumonia, including COVID-19 infection. Electronically Signed   By: Zetta Bills M.D.   On: 03/21/2020 08:12

## 2020-02-28 NOTE — Evaluation (Signed)
Occupational Therapy Evaluation Patient Details Name: Brandon Santos MRN: [id number] DOB: 03-May-1934 Today's Date: 02/28/2020    History of Present Illness 86yo male not feeling well for 9-10 days with increasing SOB. Found to be covid positive. Quickly deteriorated and required 15LPM per NRB. PMH DM, HLD, HTN, lumbar fusion   Clinical Impression   PTA pt living with spouse and functioning at independent level. Wife is currently in ICU with COVID as well. At time of eval, pt required encouragement to participate. He was able to complete bed mobility with min A and was only agreeable to staying EOB this date. During this time, pt performed IS exercises. Pt was also educated on beginnings of ECS strategies. With 15L HFNC pt remained in mid 80s and had supplemental NRB as needed. Noted cognitive deficits in awareness of deficits and STM this date. Given current status, recommend short term SNF to improve BADL engagement prior to d/c. Pt may refuse this. Will continue to follow per POC listed below.   Vitals: Pt required 15L of O2 via HFNC to maintain SpO2 >83% with bed mobility     Follow Up Recommendations  SNF (pt may refuse, will need increased help at home with wife also in ICU with COVID)    Equipment Recommendations  Tub/shower seat;3 in 1 bedside commode    Recommendations for Other Services       Precautions / Restrictions Precautions Precautions: Fall;Other (comment) Precaution Comments: watch O2 Restrictions Weight Bearing Restrictions: No      Mobility Bed Mobility Overal bed mobility: Needs Assistance Bed Mobility: Supine to Sit;Sit to Supine     Supine to sit: Min assist Sit to supine: Min assist   General bed mobility comments: assist for BLE and to come into upright sitting with increased time. Pt very fatigued and feeling SOB today- only agreeable to EOB  Transfers                      Balance Overall balance assessment: Needs  assistance Sitting-balance support: Bilateral upper extremity supported;Feet supported Sitting balance-Leahy Scale: Good                                     ADL either performed or assessed with clinical judgement   ADL Overall ADL's : Needs assistance/impaired Eating/Feeding: Set up;Sitting   Grooming: Set up;Sitting   Upper Body Bathing: Minimal assistance;Sitting   Lower Body Bathing: Moderate assistance;Sitting/lateral leans;Sit to/from stand   Upper Body Dressing : Minimal assistance;Sitting   Lower Body Dressing: Moderate assistance;Sit to/from stand;Sitting/lateral leans   Toilet Transfer: Moderate assistance;Stand-pivot;BSC   Toileting- Clothing Manipulation and Hygiene: Minimal assistance;Sitting/lateral lean;Sit to/from stand       Functional mobility during ADLs: Minimal assistance;Cueing for safety;Cueing for sequencing       Vision Baseline Vision/History: No visual deficits Patient Visual Report: No change from baseline       Perception     Praxis      Pertinent Vitals/Pain Pain Assessment: No/denies pain     Hand Dominance     Extremity/Trunk Assessment Upper Extremity Assessment Upper Extremity Assessment: Generalized weakness   Lower Extremity Assessment Lower Extremity Assessment: Generalized weakness       Communication Communication Communication: No difficulties   Cognition Arousal/Alertness: Awake/alert Behavior During Therapy: WFL for tasks assessed/performed;Flat affect Overall Cognitive Status: No family/caregiver present to determine baseline cognitive functioning Area of Impairment: Safety/judgement;Memory  Memory: Decreased short-term memory   Safety/Judgement: Decreased awareness of deficits (not understanding isolation precautions)         General Comments       Exercises Other Exercises Other Exercises: IS: 750-1087mL with max cues for technique   Shoulder Instructions       Home Living Family/patient expects to be discharged to:: Private residence Living Arrangements: Spouse/significant other Available Help at Discharge: Family;Available 24 hours/day Type of Home: House Home Access: Stairs to enter [employer] of Steps: 4 Entrance Stairs-Rails: Left Home Layout: Two level;Bed/bath upstairs Alternate Level Stairs-Number of Steps: flight Alternate Level Stairs-Rails: Can reach both Bathroom Shower/Tub: Walk-in shower;Tub/shower unit   Bathroom Toilet: Standard     Home Equipment: None   Additional Comments: very independent, prides himself on being optimistic and having a good attitude      Prior Functioning/Environment Level of Independence: Independent        Comments: wife currently in ICU with covid        OT Problem List: Decreased strength;Decreased knowledge of use of DME or AE;Decreased activity tolerance;Cardiopulmonary status limiting activity;Impaired balance (sitting and/or standing);Decreased safety awareness;Decreased knowledge of precautions      OT Treatment/Interventions: Self-care/ADL training;Therapeutic exercise;Patient/family education;Balance training;Energy conservation;Therapeutic activities;DME and/or AE instruction    OT Goals(Current goals can be found in the care plan section) Acute Rehab OT Goals Patient Stated Goal: get healthy again OT Goal Formulation: With patient Time For Goal Achievement: 03/13/20 Potential to Achieve Goals: Good  OT Frequency: Min 2X/week   Barriers to D/C:            Co-evaluation              AM-PAC OT "6 Clicks" Daily Activity     Outcome Measure Help from another person eating meals?: A Little Help from another person taking care of personal grooming?: A Little Help from another person toileting, which includes using toliet, bedpan, or urinal?: A Lot Help from another person bathing (including washing, rinsing, drying)?: A Lot Help from another person to  put on and taking off regular upper body clothing?: A Little Help from another person to put on and taking off regular lower body clothing?: A Lot 6 Click Score: 15   End of Session Equipment Utilized During Treatment: Oxygen (15L HFNC with supp NRB) Nurse Communication: Mobility status  Activity Tolerance: Patient limited by fatigue Patient left: in bed;with call bell/phone within reach  OT Visit Diagnosis: Unsteadiness on feet (R26.81);Other abnormalities of gait and mobility (R26.89);Muscle weakness (generalized) (M62.81)                Time: 7169-6789 OT Time Calculation (min): 10 min Charges:  OT General Charges $OT Visit: 1 Visit OT Evaluation $OT Eval Moderate Complexity: [place], MSOT, OTR/L Acute Rehabilitation Services [facility] Office Number: [phone] Pager: [phone]  Zenovia Jarred 02/28/2020, 3:10 PM

## 2020-02-29 ENCOUNTER — Inpatient Hospital Stay (HOSPITAL_COMMUNITY): Payer: BC Managed Care – PPO

## 2020-02-29 LAB — CBC WITH DIFFERENTIAL/PLATELET
Abs Immature Granulocytes: 0.15 10*3/uL — ABNORMAL HIGH (ref 0.00–0.07)
Basophils Absolute: 0 10*3/uL (ref 0.0–0.1)
Basophils Relative: 0 %
Eosinophils Absolute: 0 10*3/uL (ref 0.0–0.5)
Eosinophils Relative: 0 %
HCT: 39.5 % (ref 39.0–52.0)
Hemoglobin: 13.5 g/dL (ref 13.0–17.0)
Immature Granulocytes: 1 %
Lymphocytes Relative: 5 %
Lymphs Abs: 0.9 10*3/uL (ref 0.7–4.0)
MCH: 29.1 pg (ref 26.0–34.0)
MCHC: 34.2 g/dL (ref 30.0–36.0)
MCV: 85.1 fL (ref 80.0–100.0)
Monocytes Absolute: 0.7 10*3/uL (ref 0.1–1.0)
Monocytes Relative: 4 %
Neutro Abs: 15.4 10*3/uL — ABNORMAL HIGH (ref 1.7–7.7)
Neutrophils Relative %: 90 %
Platelets: 341 10*3/uL (ref 150–400)
RBC: 4.64 MIL/uL (ref 4.22–5.81)
RDW: 13.3 % (ref 11.5–15.5)
WBC: 17.2 10*3/uL — ABNORMAL HIGH (ref 4.0–10.5)
nRBC: 0 % (ref 0.0–0.2)

## 2020-02-29 LAB — COMPREHENSIVE METABOLIC PANEL
ALT: 43 U/L (ref 0–44)
AST: 48 U/L — ABNORMAL HIGH (ref 15–41)
Albumin: 2.6 g/dL — ABNORMAL LOW (ref 3.5–5.0)
Alkaline Phosphatase: 53 U/L (ref 38–126)
Anion gap: 13 (ref 5–15)
BUN: 49 mg/dL — ABNORMAL HIGH (ref 8–23)
CO2: 20 mmol/L — ABNORMAL LOW (ref 22–32)
Calcium: 8.6 mg/dL — ABNORMAL LOW (ref 8.9–10.3)
Chloride: 108 mmol/L (ref 98–111)
Creatinine, Ser: 1.47 mg/dL — ABNORMAL HIGH (ref 0.61–1.24)
GFR calc Af Amer: 50 mL/min — ABNORMAL LOW (ref >60)
GFR calc non Af Amer: 43 mL/min — ABNORMAL LOW (ref >60)
Glucose, Bld: 88 mg/dL (ref 70–99)
Potassium: 3.9 mmol/L (ref 3.5–5.1)
Sodium: 141 mmol/L (ref 135–145)
Total Bilirubin: 0.7 mg/dL (ref 0.3–1.2)
Total Protein: 6 g/dL — ABNORMAL LOW (ref 6.5–8.1)

## 2020-02-29 LAB — FERRITIN: Ferritin: 503 ng/mL — ABNORMAL HIGH (ref 24–336)

## 2020-02-29 LAB — C-REACTIVE PROTEIN: CRP: 2.9 mg/dL — ABNORMAL HIGH (ref <1.0)

## 2020-02-29 LAB — D-DIMER, QUANTITATIVE: D-Dimer, Quant: 0.69 ug/mL-FEU — ABNORMAL HIGH (ref 0.00–0.50)

## 2020-02-29 LAB — GLUCOSE, CAPILLARY
Glucose-Capillary: 108 mg/dL — ABNORMAL HIGH (ref 70–99)
Glucose-Capillary: 94 mg/dL (ref 70–99)
Glucose-Capillary: 158 mg/dL — ABNORMAL HIGH (ref 70–99)
Glucose-Capillary: 234 mg/dL — ABNORMAL HIGH (ref 70–99)
Glucose-Capillary: 253 mg/dL — ABNORMAL HIGH (ref 70–99)

## 2020-02-29 LAB — MAGNESIUM: Magnesium: 2.1 mg/dL (ref 1.7–2.4)

## 2020-02-29 MED ORDER — SODIUM CHLORIDE 0.9 % IV SOLN
100.0000 mg | Freq: Every day | INTRAVENOUS | Status: AC
  Administered 2020-02-29: 100 mg via INTRAVENOUS
  Filled 2020-02-29: qty 20

## 2020-02-29 NOTE — Progress Notes (Signed)
Physical Therapy Treatment Patient Details Name: Brandon Santos MRN: [id number] DOB: 08/14/1933 Today's Date: 02/29/2020    History of Present Illness 86yo male not feeling well for 9-10 days with increasing SOB. Found to be covid positive. Quickly deteriorated and required 15LPM per NRB. PMH DM, HLD, HTN, lumbar fusion    PT Comments    Pt making slow progress. Able to tolerate 1 very short walk in room. Requires HFNC and NRB and takes significant time to recover. Continue to recommend SNF. If pt refuses SNF will need 24 hour assist at home.    Follow Up Recommendations  SNF;Supervision/Assistance - 24 hour     Equipment Recommendations  Rolling walker with 5" wheels    Recommendations for Other Services       Precautions / Restrictions Precautions Precautions: Fall;Other (comment) Precaution Comments: watch O2    Mobility  Bed Mobility               General bed mobility comments: Pt up in chair  Transfers Overall transfer level: Needs assistance Equipment used: Rolling walker (2 wheeled) Transfers: Sit to/from Stand Sit to Stand: Min assist         General transfer comment: Assist to bring hips up and for balance  Ambulation/Gait Ambulation/Gait assistance: Min assist Gait Distance (Feet): 10 Feet (5' forward and 5' backward) Assistive device: Rolling walker (2 wheeled) Gait Pattern/deviations: Step-through pattern;Decreased step length - right;Decreased step length - left Gait velocity: decr Gait velocity interpretation: <1.31 ft/sec, indicative of household ambulator General Gait Details: Assist for balance.    Stairs             Wheelchair Mobility    Modified Rankin (Stroke Patients Only)       Balance Overall balance assessment: Needs assistance Sitting-balance support: Bilateral upper extremity supported;Feet supported Sitting balance-Leahy Scale: Good     Standing balance support: Bilateral upper extremity supported;During  functional activity Standing balance-Leahy Scale: Poor Standing balance comment: walker and min guard for static standing                            Cognition Arousal/Alertness: Awake/alert Behavior During Therapy: WFL for tasks assessed/performed;Flat affect Overall Cognitive Status: No family/caregiver present to determine baseline cognitive functioning Area of Impairment: Safety/judgement;Memory                     Memory: Decreased short-term memory   Safety/Judgement: Decreased awareness of deficits            Exercises      General Comments General comments (skin integrity, edema, etc.): Pt on 15 HFNC and 15L NRB. SpO2 to 84% with amb. Recovered to 90% after 4-5 minute seated rest break.       Pertinent Vitals/Pain Pain Assessment: No/denies pain    Home Living                      Prior Function            PT Goals (current goals can now be found in the care plan section) Acute Rehab PT Goals Patient Stated Goal: get healthy again Progress towards PT goals: Progressing toward goals    Frequency    Min 3X/week      PT Plan Current plan remains appropriate    Co-evaluation              AM-PAC PT "6 Clicks" Mobility   Outcome Measure  Help needed turning from your back to your side while in a flat bed without using bedrails?: A Little Help needed moving from lying on your back to sitting on the side of a flat bed without using bedrails?: A Little Help needed moving to and from a bed to a chair (including a wheelchair)?: A Little Help needed standing up from a chair using your arms (e.g., wheelchair or bedside chair)?: A Little Help needed to walk in hospital room?: A Lot Help needed climbing 3-5 steps with a railing? : Total 6 Click Score: 15    End of Session Equipment Utilized During Treatment: Oxygen Activity Tolerance: Patient limited by fatigue Patient left: in chair;with call bell/phone within reach   PT  Visit Diagnosis: Unsteadiness on feet (R26.81);Difficulty in walking, not elsewhere classified (R26.2);Muscle weakness (generalized) (M62.81)     Time: 5638-9373 PT Time Calculation (min) (ACUTE ONLY): 17 min  Charges:  $Gait Training: 8-22 mins                     [place] Pager [phone] Office [place] 02/29/2020, 1:21 PM

## 2020-02-29 NOTE — Progress Notes (Signed)
PROGRESS NOTE                                                                                                                                                                                                             Patient Demographics:    Brandon Santos, is a 86 y.o. male, DOB - 05-01-1934, MVH:[id number]  Outpatient Primary MD for the patient is Vivi Barrack, MD   Admit date - 03/21/2020   LOS - 4  Chief Complaint  Patient presents with  . Shortness of Breath       Brief Narrative: Patient is a 86 y.o. male with PMHx of HTN, HLD, DM-2-who was not feeling well for the past 9-10 days-presented to the ED with several days of worsening shortness of breath.  He was found to have acute hypoxic respiratory failure secondary to COVID-19 pneumonia and admitted to the hospitalist service.  Post admission-he deteriorated with worsening oxygen requirement-and was placed on 15 L HFNC with NRB.  COVID-19 vaccinated status: Unvaccinated  Significant Events: 9/2>> Admit to [facility] for for severe hypoxemia secondary to COVID-19 pneumonia  Significant studies: 9/2>>Chest x-ray: Patchy bilateral lung infiltrates 9/5>> renal ultrasound: Increased cortical echogenicity of both kidneys suggesting underlying medical renal disease, no hydronephrosis. 9/6>> chest x-ray: Stable bilateral lung opacities-concerning for multifocal pneumonia  COVID-19 medications: Steroids: 9/2>> Remdesivir: 9/2>> Actemra: 9/3 x 1  Antibiotics: None  Microbiology data: None  Procedures: None  Consults: None  DVT prophylaxis: Prophylactic Lovenox-at twice daily dosing.    Subjective:   No major issues overnight-comfortable on 15 L HFNC-and once again requiring NRB.   Assessment  & Plan :   Acute Hypoxic Resp Failure due to Covid 19 Viral pneumonia: Remains essentially the same-continues to have severe hypoxemia-on 15 L HFNC and NRB.  Continue  steroids and Remdesivir.  Does not have any signs of volume overload-continue to monitor closely-although he has severe hypoxemia-he appears comfortable-if he deteriorates significantly-plan would be to transfer to ICU.   Fever: afebrile O2 requirements:  SpO2: 92 % O2 Flow Rate (L/min): 15 L/min FiO2 (%): 100 %   COVID-19 Labs: Recent Labs    02/27/20 0536 02/28/20 0255 02/29/20 0255  DDIMER 1.31* 0.84* 0.69*  FERRITIN 885* 825* 503*  CRP 12.4* 6.0* 2.9*       Component Value Date/Time   BNP 122.6 (  H) 02/27/2020 0536    Recent Labs  Lab 03/01/2020 0801  PROCALCITON 0.22    Lab Results  Component Value Date   SARSCOV2NAA POSITIVE (A) 03/15/2020     Prone/Incentive Spirometry: encouraged  incentive spirometry use 3-4/hour, encourage prone position-2-3 hours at a time for up to 15 hours a day.  AKI: Likely hemodynamically mediated-somewhat better today-UA without proteinuria, renal ultrasound without hydronephrosis.  Avoid nephrotoxic agents-continue supportive care and follow electrolytes closely. Marland Kitchen DM-2 (A1c 7.0 on 8/12) with uncontrolled hyperglycemia due to steroids: CBGs Levemir 14 units twice daily, 6 units of NovoLog with meals and resistant SSI.  Follow and adjust.    Recent Labs    02/29/20 0730 02/29/20 0818 02/29/20 1149  GLUCAP 108* 94 158*   HTN: BP stable-continue Norvasc, metoprolol  HLD: Continue statin  Deconditioning/debility: Secondary to acute illness-PT following-recommendations are for SNF.  Obesity: Estimated body mass index is 30.25 kg/m as calculated from the following:   Height as of this encounter: 5\' 6"  (1.676 m).   Weight as of this encounter: 85 kg.    GI prophylaxis: PPI  ABG: No results found for: PHART, PCO2ART, PO2ART, HCO3, TCO2, ACIDBASEDEF, O2SAT  Vent Settings: N/A  Condition - Extremely Guarded-very tenuous with risk for further deterioration  Family Communication  :  Son Milta Deiters [phone]) - on 9/6  Code  Status :  Full Code  Diet :  Diet Order            Diet Carb Modified Fluid consistency: Thin; Room service appropriate? Yes  Diet effective now                  Disposition Plan  :   Status is: Inpatient  Remains inpatient appropriate because:Inpatient level of care appropriate due to severity of illness   Dispo: The patient is from: Home              Anticipated d/c is to: TBD              Anticipated d/c date is: > 3 days              Patient currently is not medically stable to d/c.   Barriers to discharge: Hypoxia requiring O2 supplementation/complete 5 days of IV Remdesivir  Antimicorbials  :    Anti-infectives (From admission, onward)   Start     Dose/Rate Route Frequency Ordered Stop   02/29/20 1045  remdesivir 100 mg in sodium chloride 0.9 % 100 mL IVPB        100 mg 200 mL/hr over 30 Minutes Intravenous Daily 02/29/20 1039 02/29/20 1135   02/26/20 1000  remdesivir 100 mg in sodium chloride 0.9 % 100 mL IVPB       "Followed by" Linked Group Details   100 mg 200 mL/hr over 30 Minutes Intravenous Daily 03/12/2020 1014 02/29/20 1140   02/24/2020 1100  remdesivir 200 mg in sodium chloride 0.9% 250 mL IVPB       "Followed by" Linked Group Details   200 mg 580 mL/hr over 30 Minutes Intravenous Once 02/24/2020 1014 02/28/2020 1327      Inpatient Medications  Scheduled Meds: . amLODipine  5 mg Oral Daily  . enoxaparin (LOVENOX) injection  40 mg Subcutaneous Q12H  . insulin aspart  0-20 Units Subcutaneous TID WC  . insulin aspart  0-5 Units Subcutaneous QHS  . insulin aspart  6 Units Subcutaneous TID WC  . insulin detemir  14 Units Subcutaneous BID  . latanoprost  1 drop Both Eyes QHS  . methylPREDNISolone (SOLU-MEDROL) injection  60 mg Intravenous Q12H  . metoprolol tartrate  25 mg Oral BID  . pantoprazole  40 mg Oral Q1200  . simvastatin  20 mg Oral QPM  . sodium chloride flush  3 mL Intravenous Q12H  . timolol  1 drop Both Eyes Daily   Continuous Infusions: .  sodium chloride     PRN Meds:.sodium chloride, acetaminophen, albuterol, bisacodyl, chlorpheniramine-HYDROcodone, guaiFENesin-dextromethorphan, ondansetron **OR** ondansetron (ZOFRAN) IV, oxyCODONE, polyethylene glycol, sodium chloride flush, sodium phosphate   Time Spent in minutes  35   See all Orders from today for further details   Oren Binet M.D on 02/29/2020 at 2:16 PM  To page go to [email or web address] - use universal password  [facility] -  Office  [phone]    Objective:   Vitals:   02/29/20 0000 02/29/20 0500 02/29/20 0733 02/29/20 1151  BP: (!) 160/73 (!) 161/81 (!) 156/68 (!) 169/71  Pulse: 61 60 64 64  Resp: 15 (!) 22 20 (!) 23  Temp: 97.6 F (36.4 C) 97.6 F (36.4 C) 98.2 F (36.8 C) 97.6 F (36.4 C)  TempSrc: Oral Axillary Axillary Oral  SpO2: 95% (!) 88% 91% 92%  Weight:      Height:        Wt Readings from Last 3 Encounters:  02/26/20 85 kg  02/04/20 84.5 kg  08/10/19 85.4 kg     Intake/Output Summary (Last 24 hours) at 02/29/2020 1416 Last data filed at 02/29/2020 1051 Gross per 24 hour  Intake --  Output 850 ml  Net -850 ml     Physical Exam Gen Exam:Alert awake-not in any distress HEENT:atraumatic, normocephalic Chest: B/L clear to auscultation anteriorly CVS:S1S2 regular Abdomen:soft non tender, non distended Extremities:no edema Neurology: Non focal Skin: no rash   Data Review:    CBC Recent Labs  Lab 03/18/2020 0801 02/26/20 0239 02/27/20 0536 02/28/20 0255 02/29/20 0255  WBC 9.2 10.1 17.2* 16.1* 17.2*  HGB 13.3 13.8 15.0 14.1 13.5  HCT 39.3 41.6 44.4 42.5 39.5  PLT 231 255 330 285 341  MCV 88.7 88.1 86.0 87.1 85.1  MCH 30.0 29.2 29.1 28.9 29.1  MCHC 33.8 33.2 33.8 33.2 34.2  RDW 13.4 13.5 13.5 13.4 13.3  LYMPHSABS 1.8 1.2 2.0 1.3 0.9  MONOABS 0.6 0.5 0.8 0.7 0.7  EOSABS 0.0 0.0 0.0 0.0 0.0  BASOSABS 0.0 0.0 0.0 0.0 0.0    Chemistries  Recent Labs  Lab 03/05/2020 0801 02/26/20 0239 02/27/20 0536  02/28/20 0255 02/29/20 0255  NA 136 136 137 139 141  K 4.2 4.5 4.6 4.5 3.9  CL 102 102 103 107 108  CO2 20* 22 19* 20* 20*  GLUCOSE 212* 269* 201* 232* 88  BUN 40* 38* 49* 58* 49*  CREATININE 1.85* 1.48* 1.60* 1.66* 1.47*  CALCIUM 8.6* 8.6* 8.9 8.7* 8.6*  MG  --  1.9 2.3 2.2 2.1  AST 68* 90* 79* 59* 48*  ALT 37 44 47* 41 43  ALKPHOS 40 47 54 49 53  BILITOT 0.9 1.4* 1.0 0.9 0.7   ------------------------------------------------------------------------------------------------------------------ No results for input(s): CHOL, HDL, LDLCALC, TRIG, CHOLHDL, LDLDIRECT in the last 72 hours.  Lab Results  Component Value Date   HGBA1C 7.0 (H) 02/04/2020   ------------------------------------------------------------------------------------------------------------------ No results for input(s): TSH, T4TOTAL, T3FREE, THYROIDAB in the last 72 hours.  Invalid input(s): FREET3 ------------------------------------------------------------------------------------------------------------------ Recent Labs    02/28/20 0255 02/29/20 0255  FERRITIN 825* 503*    Coagulation profile  Recent Labs  Lab 03/23/2020 0801  INR 1.0    Recent Labs    02/28/20 0255 02/29/20 0255  DDIMER 0.84* 0.69*    Cardiac Enzymes No results for input(s): CKMB, TROPONINI, MYOGLOBIN in the last 168 hours.  Invalid input(s): CK ------------------------------------------------------------------------------------------------------------------    Component Value Date/Time   BNP 122.6 (H) 02/27/2020 0536    Micro Results Recent Results (from the past 240 hour(s))  SARS Coronavirus 2 by RT PCR (hospital order, performed in [facility] hospital lab) Nasopharyngeal Nasopharyngeal Swab     Status: Abnormal   Collection Time: 03/03/2020  7:55 AM   Specimen: Nasopharyngeal Swab  Result Value Ref Range Status   SARS Coronavirus 2 POSITIVE (A) NEGATIVE Final    Comment: emailed L. Berdik RN 10:00 03/01/2020  ([email or web address]) (NOTE) SARS-CoV-2 target nucleic acids are DETECTED  SARS-CoV-2 RNA is generally detectable in upper respiratory specimens  during the acute phase of infection.  Positive results are indicative  of the presence of the identified virus, but do not rule out bacterial infection or co-infection with other pathogens not detected by the test.  Clinical correlation with patient history and  other diagnostic information is necessary to determine patient infection status.  The expected result is negative.  Fact Sheet for Patients:   StrictlyIdeas.no   Fact Sheet for Healthcare Providers:   BankingDealers.co.za    This test is not yet approved or cleared by the [place] FDA and  has been authorized for detection and/or diagnosis of SARS-CoV-2 by FDA under an Emergency Use Authorization (EUA).  This EUA will remain in effect (meaning this test can be used) for the duration of  the  COVID-19 declaration under Section 564(b)(1) of the Act, 21 U.S.C. section 360-bbb-3(b)(1), unless the authorization is terminated or revoked sooner.  Performed at [facility], [place] [address], [place], [place] [address]     Radiology Reports US RENAL  Result Date: 02/28/2020 CLINICAL DATA:  Acute renal failure EXAM: RENAL / URINARY TRACT ULTRASOUND COMPLETE COMPARISON:  None. FINDINGS: Right Kidney: Renal measurements: 9.2 x 5.2 x 5.0 cm = volume: 125 mL. Renal cortical echogenicity is diffusely increased suggesting changes of underlying medical renal disease. There is mild diffuse renal cortical atrophy. No hydronephrosis. No intrarenal masses or calcifications. Left Kidney: Renal measurements: 9.1 x 5.2 x 4.5 cm = volume: 106 mL. Renal cortical echogenicity is diffusely increased suggesting changes of underlying medical renal disease. There is mild diffuse renal cortical atrophy. No hydronephrosis. No intrarenal masses or calcifications. 2 cm  simple cortical cyst arises exophytically from the lower pole. Bladder: Is decompressed and is not well visualized. Other: None. IMPRESSION: 1. Increased cortical echogenicity of both kidneys suggesting changes of underlying medical renal disease. 2. Mild diffuse renal cortical atrophy. Electronically Signed   By: Fidela Salisbury MD   On: 02/28/2020 16:11   DG Chest Port 1 View  Result Date: 02/29/2020 CLINICAL DATA:  Shortness of breath, COVID-19. EXAM: PORTABLE CHEST 1 VIEW COMPARISON:  February 25, 2020. FINDINGS: Stable cardiomediastinal silhouette. No pneumothorax or pleural effusion is noted. Stable bilateral lung opacities are noted concerning for multifocal pneumonia due to COVID-19. Bony thorax is unremarkable. IMPRESSION: Stable bilateral lung opacities are noted concerning for multifocal pneumonia due to COVID-19. Electronically Signed   By: Marijo Conception M.D.   On: 02/29/2020 08:13   DG Chest Port 1 View  Result Date: 03/01/2020 CLINICAL DATA:  COVID shortness of breath EXAM: PORTABLE CHEST 1 VIEW COMPARISON:  January 14, 2014. FINDINGS: Trachea midline. Cardiomediastinal contours are stable. Interval development of patchy basilar and mid lung predominant opacities in the LEFT and RIGHT chest both interstitial and airspace component. Process more pronounced at the LEFT lung base. Visualized skeletal structures on limited assessment without acute process. No sign of pleural effusion. IMPRESSION: Patchy basilar and mid lung predominant opacities in the LEFT and RIGHT chest both interstitial and airspace component, findings could be seen in the setting of viral or atypical pneumonia, including COVID-19 infection. Electronically Signed   By: Zetta Bills M.D.   On: 02/24/2020 08:12

## 2020-02-29 NOTE — Progress Notes (Signed)
Patient's son Milta Deiters updated about plan of care over the phone.

## 2020-03-01 LAB — CBC WITH DIFFERENTIAL/PLATELET
Abs Immature Granulocytes: 0.17 10*3/uL — ABNORMAL HIGH (ref 0.00–0.07)
Basophils Absolute: 0 10*3/uL (ref 0.0–0.1)
Basophils Relative: 0 %
Eosinophils Absolute: 0 10*3/uL (ref 0.0–0.5)
Eosinophils Relative: 0 %
HCT: 40.6 % (ref 39.0–52.0)
Hemoglobin: 13.8 g/dL (ref 13.0–17.0)
Immature Granulocytes: 1 %
Lymphocytes Relative: 6 %
Lymphs Abs: 1.1 10*3/uL (ref 0.7–4.0)
MCH: 29.1 pg (ref 26.0–34.0)
MCHC: 34 g/dL (ref 30.0–36.0)
MCV: 85.5 fL (ref 80.0–100.0)
Monocytes Absolute: 0.8 10*3/uL (ref 0.1–1.0)
Monocytes Relative: 4 %
Neutro Abs: 18 10*3/uL — ABNORMAL HIGH (ref 1.7–7.7)
Neutrophils Relative %: 89 %
Platelets: 364 10*3/uL (ref 150–400)
RBC: 4.75 MIL/uL (ref 4.22–5.81)
RDW: 13.3 % (ref 11.5–15.5)
WBC: 20.1 10*3/uL — ABNORMAL HIGH (ref 4.0–10.5)
nRBC: 0 % (ref 0.0–0.2)

## 2020-03-01 LAB — GLUCOSE, CAPILLARY
Glucose-Capillary: 130 mg/dL — ABNORMAL HIGH (ref 70–99)
Glucose-Capillary: 140 mg/dL — ABNORMAL HIGH (ref 70–99)
Glucose-Capillary: 189 mg/dL — ABNORMAL HIGH (ref 70–99)
Glucose-Capillary: 250 mg/dL — ABNORMAL HIGH (ref 70–99)
Glucose-Capillary: 230 mg/dL — ABNORMAL HIGH (ref 70–99)

## 2020-03-01 LAB — COMPREHENSIVE METABOLIC PANEL
ALT: 46 U/L — ABNORMAL HIGH (ref 0–44)
AST: 43 U/L — ABNORMAL HIGH (ref 15–41)
Albumin: 2.8 g/dL — ABNORMAL LOW (ref 3.5–5.0)
Alkaline Phosphatase: 60 U/L (ref 38–126)
Anion gap: 13 (ref 5–15)
BUN: 43 mg/dL — ABNORMAL HIGH (ref 8–23)
CO2: 23 mmol/L (ref 22–32)
Calcium: 8.8 mg/dL — ABNORMAL LOW (ref 8.9–10.3)
Chloride: 106 mmol/L (ref 98–111)
Creatinine, Ser: 1.32 mg/dL — ABNORMAL HIGH (ref 0.61–1.24)
GFR calc Af Amer: 57 mL/min — ABNORMAL LOW (ref >60)
GFR calc non Af Amer: 50 mL/min — ABNORMAL LOW (ref >60)
Glucose, Bld: 109 mg/dL — ABNORMAL HIGH (ref 70–99)
Potassium: 4.2 mmol/L (ref 3.5–5.1)
Sodium: 142 mmol/L (ref 135–145)
Total Bilirubin: 0.6 mg/dL (ref 0.3–1.2)
Total Protein: 6.1 g/dL — ABNORMAL LOW (ref 6.5–8.1)

## 2020-03-01 LAB — D-DIMER, QUANTITATIVE: D-Dimer, Quant: 0.73 ug/mL-FEU — ABNORMAL HIGH (ref 0.00–0.50)

## 2020-03-01 LAB — MAGNESIUM: Magnesium: 2.1 mg/dL (ref 1.7–2.4)

## 2020-03-01 LAB — FERRITIN: Ferritin: 494 ng/mL — ABNORMAL HIGH (ref 24–336)

## 2020-03-01 LAB — C-REACTIVE PROTEIN: CRP: 1.6 mg/dL — ABNORMAL HIGH (ref <1.0)

## 2020-03-01 MED ORDER — METHYLPREDNISOLONE SODIUM SUCC 40 MG IJ SOLR
40.0000 mg | Freq: Two times a day (BID) | INTRAMUSCULAR | Status: DC
  Administered 2020-03-01 – 2020-03-09 (×16): 40 mg via INTRAVENOUS
  Filled 2020-03-01 (×16): qty 1

## 2020-03-01 MED ORDER — PROSOURCE PLUS PO LIQD
30.0000 mL | Freq: Every day | ORAL | Status: DC
  Administered 2020-03-01 – 2020-03-07 (×7): 30 mL via ORAL
  Filled 2020-03-01 (×5): qty 30

## 2020-03-01 MED ORDER — ENSURE ENLIVE PO LIQD
237.0000 mL | Freq: Two times a day (BID) | ORAL | Status: DC
  Administered 2020-03-01 – 2020-03-06 (×10): 237 mL via ORAL

## 2020-03-01 NOTE — Plan of Care (Signed)

## 2020-03-01 NOTE — Progress Notes (Signed)
PROGRESS NOTE                                                                                                                                                                                                             Patient Demographics:    Brandon Santos, is a 86 y.o. male, DOB - 01-01-1934, SEL:[id number]  Outpatient Primary MD for the patient is Vivi Barrack, MD   Admit date - 03/08/2020   LOS - 5  Chief Complaint  Patient presents with  . Shortness of Breath       Brief Narrative: Patient is a 86 y.o. male with PMHx of HTN, HLD, DM-2-who was not feeling well for the past 9-10 days-presented to the ED with several days of worsening shortness of breath.  He was found to have acute hypoxic respiratory failure secondary to COVID-19 pneumonia and admitted to the hospitalist service.  Post admission-he deteriorated with worsening oxygen requirement-and was placed on 15 L HFNC with NRB.  COVID-19 vaccinated status: Unvaccinated  Significant Events: 9/2>> Admit to [facility] for for severe hypoxemia secondary to COVID-19 pneumonia  Significant studies: 9/2>>Chest x-ray: Patchy bilateral lung infiltrates 9/5>> renal ultrasound: Increased cortical echogenicity of both kidneys suggesting underlying medical renal disease, no hydronephrosis. 9/6>> chest x-ray: Stable bilateral lung opacities-concerning for multifocal pneumonia  COVID-19 medications: Steroids: 9/2>> Remdesivir: 9/2>>9/6 Actemra: 9/3 x 1  Antibiotics: None  Microbiology data: None  Procedures: None  Consults: None  DVT prophylaxis: Prophylactic Lovenox-at twice daily dosing.    Subjective:   Remains essentially unchanged-On 15L HFNC and NRB. Feels weak and tired.    Assessment  & Plan :   Acute Hypoxic Resp Failure due to Covid 19 Viral pneumonia: He continues to have severe hypoxemia-remains on 15 L HFNC and NRB.  Although severely hypoxemic-he  appears comfortable.  He does not appear to have any signs of volume overload.  Remains on steroids.  Plans are to continue to monitor closely and provide supportive care.  If he were to significantly deteriorate-he will be transferred to the ICU.  He wishes to be a full code-apparently his spouse was acutely ill and in the ICU intubated-but has been extubated.    Fever: afebrile O2 requirements:  SpO2: 90 % O2 Flow Rate (L/min): 15 L/min FiO2 (%): 100 %   COVID-19 Labs: Recent Labs  02/28/20 0255 02/29/20 0255 03/01/20 0307  DDIMER 0.84* 0.69* 0.73*  FERRITIN 825* 503* 494*  CRP 6.0* 2.9* 1.6*       Component Value Date/Time   BNP 122.6 (H) 02/27/2020 0536    Recent Labs  Lab 03/13/2020 0801  PROCALCITON 0.22    Lab Results  Component Value Date   SARSCOV2NAA POSITIVE (A) 03/02/2020     Prone/Incentive Spirometry: encouraged  incentive spirometry use 3-4/hour, encourage prone position-2-3 hours at a time for up to 15 hours a day.  AKI: Likely hemodynamically mediated-somewhat better today-UA without proteinuria, renal ultrasound without hydronephrosis-but with suggestion of chronic medical renal disease.  Renal function slowly improving-continue supportive care-avoid nephrotoxic agents..   . DM-2 (A1c 7.0 on 8/12) with uncontrolled hyperglycemia due to steroids: CBGs remained stable-continue Levemir 14 units twice daily, 6 units of NovoLog with meals and resistant SSI.  Follow and adjust.    Recent Labs    03/01/20 0832 03/01/20 0903 03/01/20 1127  GLUCAP 130* 140* 189*   HTN: BP stable-continue Norvasc, metoprolol  HLD: Continue statin  Deconditioning/debility: Secondary to acute illness-PT following-recommendations are for SNF.  Obesity: Estimated body mass index is 30.25 kg/m as calculated from the following:   Height as of this encounter: 5\' 6"  (1.676 m).   Weight as of this encounter: 85 kg.   GI prophylaxis: PPI  ABG: No results found for: PHART,  PCO2ART, PO2ART, HCO3, TCO2, ACIDBASEDEF, O2SAT  Vent Settings: N/A  Condition - Extremely Guarded-very tenuous with risk for further deterioration  Family Communication  :  Son Milta Deiters [phone]) - on 9/7  Code Status :  Full Code  Diet :  Diet Order            Diet Carb Modified Fluid consistency: Thin; Room service appropriate? Yes  Diet effective now                  Disposition Plan  :   Status is: Inpatient  Remains inpatient appropriate because:Inpatient level of care appropriate due to severity of illness   Dispo: The patient is from: Home              Anticipated d/c is to: TBD              Anticipated d/c date is: > 3 days              Patient currently is not medically stable to d/c.   Barriers to discharge: Hypoxia requiring O2 supplementation/complete 5 days of IV Remdesivir  Antimicorbials  :    Anti-infectives (From admission, onward)   Start     Dose/Rate Route Frequency Ordered Stop   02/29/20 1045  remdesivir 100 mg in sodium chloride 0.9 % 100 mL IVPB        100 mg 200 mL/hr over 30 Minutes Intravenous Daily 02/29/20 1039 02/29/20 1135   02/26/20 1000  remdesivir 100 mg in sodium chloride 0.9 % 100 mL IVPB       "Followed by" Linked Group Details   100 mg 200 mL/hr over 30 Minutes Intravenous Daily 02/29/2020 1014 02/29/20 1140   03/01/2020 1100  remdesivir 200 mg in sodium chloride 0.9% 250 mL IVPB       "Followed by" Linked Group Details   200 mg 580 mL/hr over 30 Minutes Intravenous Once 03/10/2020 1014 03/21/2020 1327      Inpatient Medications  Scheduled Meds: . (feeding supplement) PROSource Plus  30 mL Oral Daily  . amLODipine  5  mg Oral Daily  . enoxaparin (LOVENOX) injection  40 mg Subcutaneous Q12H  . feeding supplement (ENSURE ENLIVE)  237 mL Oral BID BM  . insulin aspart  0-20 Units Subcutaneous TID WC  . insulin aspart  0-5 Units Subcutaneous QHS  . insulin aspart  6 Units Subcutaneous TID WC  . insulin detemir  14 Units  Subcutaneous BID  . latanoprost  1 drop Both Eyes QHS  . methylPREDNISolone (SOLU-MEDROL) injection  60 mg Intravenous Q12H  . metoprolol tartrate  25 mg Oral BID  . pantoprazole  40 mg Oral Q1200  . simvastatin  20 mg Oral QPM  . sodium chloride flush  3 mL Intravenous Q12H  . timolol  1 drop Both Eyes Daily   Continuous Infusions: . sodium chloride     PRN Meds:.sodium chloride, acetaminophen, albuterol, bisacodyl, chlorpheniramine-HYDROcodone, guaiFENesin-dextromethorphan, ondansetron **OR** ondansetron (ZOFRAN) IV, oxyCODONE, polyethylene glycol, sodium chloride flush, sodium phosphate   Time Spent in minutes  35   See all Orders from today for further details   Oren Binet M.D on 03/01/2020 at 2:28 PM  To page go to [email or web address] - use universal password  [facility] -  Office  [phone]    Objective:   Vitals:   03/01/20 0550 03/01/20 0640 03/01/20 0829 03/01/20 1138  BP:   (!) 178/72 (!) 155/80  Pulse: 63 79 91 68  Resp: 18 (!) 22 20 20   Temp:   98.7 F (37.1 C) 98.1 F (36.7 C)  TempSrc:   Oral Oral  SpO2: 95% 93% 90% 90%  Weight:      Height:        Wt Readings from Last 3 Encounters:  02/26/20 85 kg  02/04/20 84.5 kg  08/10/19 85.4 kg     Intake/Output Summary (Last 24 hours) at 03/01/2020 1428 Last data filed at 03/01/2020 1017 Gross per 24 hour  Intake 240 ml  Output 975 ml  Net -735 ml     Physical Exam Gen Exam:Alert awake-not in any distress.  Looks frail and weak. HEENT:atraumatic, normocephalic Chest: B/L clear to auscultation anteriorly CVS:S1S2 regular Abdomen:soft non tender, non distended Extremities:no edema Neurology: Non focal Skin: no rash   Data Review:    CBC Recent Labs  Lab 02/26/20 0239 02/27/20 0536 02/28/20 0255 02/29/20 0255 03/01/20 0307  WBC 10.1 17.2* 16.1* 17.2* 20.1*  HGB 13.8 15.0 14.1 13.5 13.8  HCT 41.6 44.4 42.5 39.5 40.6  PLT 255 330 285 341 364  MCV 88.1 86.0 87.1 85.1 85.5  MCH  29.2 29.1 28.9 29.1 29.1  MCHC 33.2 33.8 33.2 34.2 34.0  RDW 13.5 13.5 13.4 13.3 13.3  LYMPHSABS 1.2 2.0 1.3 0.9 1.1  MONOABS 0.5 0.8 0.7 0.7 0.8  EOSABS 0.0 0.0 0.0 0.0 0.0  BASOSABS 0.0 0.0 0.0 0.0 0.0    Chemistries  Recent Labs  Lab 02/26/20 0239 02/27/20 0536 02/28/20 0255 02/29/20 0255 03/01/20 0307  NA 136 137 139 141 142  K 4.5 4.6 4.5 3.9 4.2  CL 102 103 107 108 106  CO2 22 19* 20* 20* 23  GLUCOSE 269* 201* 232* 88 109*  BUN 38* 49* 58* 49* 43*  CREATININE 1.48* 1.60* 1.66* 1.47* 1.32*  CALCIUM 8.6* 8.9 8.7* 8.6* 8.8*  MG 1.9 2.3 2.2 2.1 2.1  AST 90* 79* 59* 48* 43*  ALT 44 47* 41 43 46*  ALKPHOS 47 54 49 53 60  BILITOT 1.4* 1.0 0.9 0.7 0.6   ------------------------------------------------------------------------------------------------------------------ No results for input(s): CHOL, HDL,  LDLCALC, TRIG, CHOLHDL, LDLDIRECT in the last 72 hours.  Lab Results  Component Value Date   HGBA1C 7.0 (H) 02/04/2020   ------------------------------------------------------------------------------------------------------------------ No results for input(s): TSH, T4TOTAL, T3FREE, THYROIDAB in the last 72 hours.  Invalid input(s): FREET3 ------------------------------------------------------------------------------------------------------------------ Recent Labs    02/29/20 0255 03/01/20 0307  FERRITIN 503* 494*    Coagulation profile Recent Labs  Lab 03/23/2020 0801  INR 1.0    Recent Labs    02/29/20 0255 03/01/20 0307  DDIMER 0.69* 0.73*    Cardiac Enzymes No results for input(s): CKMB, TROPONINI, MYOGLOBIN in the last 168 hours.  Invalid input(s): CK ------------------------------------------------------------------------------------------------------------------    Component Value Date/Time   BNP 122.6 (H) 02/27/2020 0536    Micro Results Recent Results (from the past 240 hour(s))  SARS Coronavirus 2 by RT PCR (hospital order, performed in  [facility] hospital lab) Nasopharyngeal Nasopharyngeal Swab     Status: Abnormal   Collection Time: 03/04/2020  7:55 AM   Specimen: Nasopharyngeal Swab  Result Value Ref Range Status   SARS Coronavirus 2 POSITIVE (A) NEGATIVE Final    Comment: emailed L. Berdik RN 10:00 02/24/2020 ([email or web address]) (NOTE) SARS-CoV-2 target nucleic acids are DETECTED  SARS-CoV-2 RNA is generally detectable in upper respiratory specimens  during the acute phase of infection.  Positive results are indicative  of the presence of the identified virus, but do not rule out bacterial infection or co-infection with other pathogens not detected by the test.  Clinical correlation with patient history and  other diagnostic information is necessary to determine patient infection status.  The expected result is negative.  Fact Sheet for Patients:   StrictlyIdeas.no   Fact Sheet for Healthcare Providers:   BankingDealers.co.za    This test is not yet approved or cleared by the [place] FDA and  has been authorized for detection and/or diagnosis of SARS-CoV-2 by FDA under an Emergency Use Authorization (EUA).  This EUA will remain in effect (meaning this test can be used) for the duration of  the  COVID-19 declaration under Section 564(b)(1) of the Act, 21 U.S.C. section 360-bbb-3(b)(1), unless the authorization is terminated or revoked sooner.  Performed at [facility], [place] [address], [place], [place] [address]     Radiology Reports US RENAL  Result Date: 02/28/2020 CLINICAL DATA:  Acute renal failure EXAM: RENAL / URINARY TRACT ULTRASOUND COMPLETE COMPARISON:  None. FINDINGS: Right Kidney: Renal measurements: 9.2 x 5.2 x 5.0 cm = volume: 125 mL. Renal cortical echogenicity is diffusely increased suggesting changes of underlying medical renal disease. There is mild diffuse renal cortical atrophy. No hydronephrosis. No intrarenal masses or calcifications. Left  Kidney: Renal measurements: 9.1 x 5.2 x 4.5 cm = volume: 106 mL. Renal cortical echogenicity is diffusely increased suggesting changes of underlying medical renal disease. There is mild diffuse renal cortical atrophy. No hydronephrosis. No intrarenal masses or calcifications. 2 cm simple cortical cyst arises exophytically from the lower pole. Bladder: Is decompressed and is not well visualized. Other: None. IMPRESSION: 1. Increased cortical echogenicity of both kidneys suggesting changes of underlying medical renal disease. 2. Mild diffuse renal cortical atrophy. Electronically Signed   By: Fidela Salisbury MD   On: 02/28/2020 16:11   DG Chest Port 1 View  Result Date: 02/29/2020 CLINICAL DATA:  Shortness of breath, COVID-19. EXAM: PORTABLE CHEST 1 VIEW COMPARISON:  February 25, 2020. FINDINGS: Stable cardiomediastinal silhouette. No pneumothorax or pleural effusion is noted. Stable bilateral lung opacities are noted concerning for multifocal pneumonia due  to COVID-19. Bony thorax is unremarkable. IMPRESSION: Stable bilateral lung opacities are noted concerning for multifocal pneumonia due to COVID-19. Electronically Signed   By: Marijo Conception M.D.   On: 02/29/2020 08:13   DG Chest Port 1 View  Result Date: 03/14/2020 CLINICAL DATA:  COVID shortness of breath EXAM: PORTABLE CHEST 1 VIEW COMPARISON:  January 14, 2014. FINDINGS: Trachea midline. Cardiomediastinal contours are stable. Interval development of patchy basilar and mid lung predominant opacities in the LEFT and RIGHT chest both interstitial and airspace component. Process more pronounced at the LEFT lung base. Visualized skeletal structures on limited assessment without acute process. No sign of pleural effusion. IMPRESSION: Patchy basilar and mid lung predominant opacities in the LEFT and RIGHT chest both interstitial and airspace component, findings could be seen in the setting of viral or atypical pneumonia, including COVID-19 infection.  Electronically Signed   By: Zetta Bills M.D.   On: 03/23/2020 08:12

## 2020-03-01 NOTE — Progress Notes (Addendum)
Initial Nutrition Assessment  DOCUMENTATION CODES:   Not applicable  INTERVENTION:  Provide Ensure Enlive po BID, each supplement provides 350 kcal and 20 grams of protein.  Provide 30 ml Prosource plus po once daily, each supplement provides 100 kcal and 15 grams of protein.   Encourage adequate PO intake.   NUTRITION DIAGNOSIS:   Increased nutrient needs related to acute illness (COVID) as evidenced by estimated needs.  GOAL:   Patient will meet greater than or equal to 90% of their needs  MONITOR:   PO intake, Supplement acceptance, Skin, Weight trends, Labs, I & O's  REASON FOR ASSESSMENT:   Consult Assessment of nutrition requirement/status  ASSESSMENT:   86 y.o. male with PMHx of HTN, HLD, DM-2 presents with worsening shortness of breath. Pt found to have acute hypoxic respiratory failure secondary to COVID-19 pneumonia.  Pt unavailable during attempted time of contact. RD unable to obtain pt nutrition history at this time. Pt is currently on 15 L HFNC. Meal completion has been varied from 10-75%. RD to order nutritional supplements to aid in caloric and protein needs. Unable to complete Nutrition-Focused physical exam at this time. RD working remotely.  Labs and medications reviewed.   Diet Order:   Diet Order            Diet Carb Modified Fluid consistency: Thin; Room service appropriate? Yes  Diet effective now                 EDUCATION NEEDS:   Not appropriate for education at this time  Skin:  Skin Assessment: Reviewed RN Assessment  Last BM:  Unknown  Height:   Ht Readings from Last 1 Encounters:  02/26/20 5\' 6"  (1.676 m)    Weight:   Wt Readings from Last 1 Encounters:  02/26/20 85 kg    BMI:  Body mass index is 30.25 kg/m.  Estimated Nutritional Needs:   Kcal:  1850-2050  Protein:  90-105 grams  Fluid:  >/= 1.8 L/day  Corrin Parker, MS, RD, LDN RD pager number/after hours weekend pager number on Amion.

## 2020-03-01 NOTE — Progress Notes (Signed)
Occupational Therapy Treatment Patient Details Name: Brandon Santos MRN: [id number] DOB: 1933/10/28 Today's Date: 03/01/2020    History of present illness 86yo male not feeling well for 9-10 days with increasing SOB. Found to be covid positive. Quickly deteriorated and required 15LPM per NRB. PMH DM, HLD, HTN, lumbar fusion   OT comments  Pt progressing with OT goals. Pt received on 15 L HFNC, 15 L NRB mask with desats to 79% and 73% with bed mobility and transfers. With cues for PLB, pt able to recover within 2 min to 89-90%. Pt overall min guard for simple transfers, Supervision for self feeding due to difficulty complying with NRB mask wear. Pt also noted with memory and sequencing deficits, requiring consistent cues for sequencing tasks safely and locating items.    Follow Up Recommendations  SNF ([facility]OT if pt refuses SNF; wife in ICU with [facility])    Equipment Recommendations  Tub/shower seat;3 in 1 bedside commode    Recommendations for Other Services      Precautions / Restrictions Precautions Precautions: Fall;Other (comment) Precaution Comments: watch O2 Restrictions Weight Bearing Restrictions: No       Mobility Bed Mobility Overal bed mobility: Needs Assistance Bed Mobility: Supine to Sit     Supine to sit: Min assist     General bed mobility comments: Min A for advancement of B LE to EOB, cues and encouragement for initiation, difficulty problem solving  Transfers Overall transfer level: Needs assistance Equipment used: None Transfers: Sit to/from [facility] Sit to Stand: Min guard Stand pivot transfers: Min guard       General transfer comment: min guard for safety, no physical assist needed. Cues for sequencing    Balance Overall balance assessment: Needs assistance Sitting-balance support: Bilateral upper extremity supported;Feet supported Sitting balance-Leahy Scale: Good     Standing balance support: No upper extremity  supported;During functional activity Standing balance-Leahy Scale: Fair Standing balance comment: min guard for dynamic standing without AD                           ADL either performed or assessed with clinical judgement   ADL Overall ADL's : Needs assistance/impaired Eating/Feeding: Supervision/ safety;Sitting Eating/Feeding Details (indicate cue type and reason): Supervision for monitoring of O2 stats, cues to replace NRB mask after drinking due to desats                     Toilet Transfer: Min Designer, jewellery Details (indicate cue type and reason): simulated to recliner without AD, cues for sequencing           General ADL Comments: Pt with decreased strength, cardiopulmonary tolerance and cognition impacting ability to complete BADLs without assistance     Vision   Vision Assessment?: No apparent visual deficits   Perception     Praxis      Cognition Arousal/Alertness: Awake/alert Behavior During Therapy: WFL for tasks assessed/performed;Flat affect Overall Cognitive Status: No family/caregiver present to determine baseline cognitive functioning Area of Impairment: Safety/judgement;Memory                     Memory: Decreased short-term memory   Safety/Judgement: Decreased awareness of deficits     General Comments: Pt with difficulty remembering where phone is, difficulty problem solving and sequencing without verbal cues        Exercises     Shoulder Instructions       General Comments Pt  on 15 L HFNC, 15 L NRB mask desats to 79% after bed mobility, 73% after transfer to chair, recovers to 89-90% within 2 min, cues for compliance of NRB mask wear    Pertinent Vitals/ Pain       Pain Assessment: No/denies pain  Home Living                                          Prior Functioning/Environment              Frequency  Min 2X/week        Progress Toward Goals  OT  Goals(current goals can now be found in the care plan section)  Progress towards OT goals: Progressing toward goals  Acute Rehab OT Goals Patient Stated Goal: get healthy again OT Goal Formulation: With patient Time For Goal Achievement: 03/13/20 Potential to Achieve Goals: Good ADL Goals Pt Will Perform Grooming: with min assist;standing Pt Will Perform Lower Body Bathing: with min assist;sit to/from stand;sitting/lateral leans Pt Will Perform Lower Body Dressing: with min assist;sitting/lateral leans;sit to/from stand Pt Will Transfer to Toilet: with min guard assist;ambulating;regular height toilet Pt/caregiver will Perform Home Exercise Program: Increased strength;Both right and left upper extremity;With theraband Additional ADL Goal #1: Pt will recall and apply 3-5 ECS strategies to ADL routine  Plan Discharge plan remains appropriate    Co-evaluation                 AM-PAC OT "6 Clicks" Daily Activity     Outcome Measure   Help from another person eating meals?: A Little Help from another person taking care of personal grooming?: A Little Help from another person toileting, which includes using toliet, bedpan, or urinal?: A Lot Help from another person bathing (including washing, rinsing, drying)?: A Lot Help from another person to put on and taking off regular upper body clothing?: A Little Help from another person to put on and taking off regular lower body clothing?: A Lot 6 Click Score: 15    End of Session Equipment Utilized During Treatment: Gait belt;Oxygen  OT Visit Diagnosis: Unsteadiness on feet (R26.81);Other abnormalities of gait and mobility (R26.89);Muscle weakness (generalized) (M62.81)   Activity Tolerance Patient limited by fatigue   Patient Left in chair;with call bell/phone within reach   Nurse Communication Mobility status;Other (comment) (O2)        Time: 5053-9767 OT Time Calculation (min): 23 min  Charges: OT General Charges $OT  Visit: 1 Visit OT Treatments $Self Care/Home Management : 8-22 mins $Therapeutic Activity: 8-22 mins  Layla Maw, OTR/L   Layla Maw 03/01/2020, 9:48 AM

## 2020-03-02 LAB — COMPREHENSIVE METABOLIC PANEL
ALT: 86 U/L — ABNORMAL HIGH (ref 0–44)
AST: 172 U/L — ABNORMAL HIGH (ref 15–41)
Albumin: 2.6 g/dL — ABNORMAL LOW (ref 3.5–5.0)
Alkaline Phosphatase: 59 U/L (ref 38–126)
Anion gap: 14 (ref 5–15)
BUN: 44 mg/dL — ABNORMAL HIGH (ref 8–23)
CO2: 21 mmol/L — ABNORMAL LOW (ref 22–32)
Calcium: 8.5 mg/dL — ABNORMAL LOW (ref 8.9–10.3)
Chloride: 102 mmol/L (ref 98–111)
Creatinine, Ser: 1.36 mg/dL — ABNORMAL HIGH (ref 0.61–1.24)
GFR calc Af Amer: 55 mL/min — ABNORMAL LOW (ref >60)
GFR calc non Af Amer: 48 mL/min — ABNORMAL LOW (ref >60)
Glucose, Bld: 331 mg/dL — ABNORMAL HIGH (ref 70–99)
Potassium: 4.3 mmol/L (ref 3.5–5.1)
Sodium: 137 mmol/L (ref 135–145)
Total Bilirubin: 1 mg/dL (ref 0.3–1.2)
Total Protein: 5.6 g/dL — ABNORMAL LOW (ref 6.5–8.1)

## 2020-03-02 LAB — CBC
HCT: 43.5 % (ref 39.0–52.0)
Hemoglobin: 15 g/dL (ref 13.0–17.0)
MCH: 29.7 pg (ref 26.0–34.0)
MCHC: 34.5 g/dL (ref 30.0–36.0)
MCV: 86.1 fL (ref 80.0–100.0)
Platelets: 384 10*3/uL (ref 150–400)
RBC: 5.05 MIL/uL (ref 4.22–5.81)
RDW: 13.2 % (ref 11.5–15.5)
WBC: 20.7 10*3/uL — ABNORMAL HIGH (ref 4.0–10.5)
nRBC: 0.1 % (ref 0.0–0.2)

## 2020-03-02 LAB — D-DIMER, QUANTITATIVE: D-Dimer, Quant: 0.87 ug/mL-FEU — ABNORMAL HIGH (ref 0.00–0.50)

## 2020-03-02 LAB — FERRITIN: Ferritin: 535 ng/mL — ABNORMAL HIGH (ref 24–336)

## 2020-03-02 LAB — GLUCOSE, CAPILLARY
Glucose-Capillary: 109 mg/dL — ABNORMAL HIGH (ref 70–99)
Glucose-Capillary: 307 mg/dL — ABNORMAL HIGH (ref 70–99)
Glucose-Capillary: 300 mg/dL — ABNORMAL HIGH (ref 70–99)
Glucose-Capillary: 313 mg/dL — ABNORMAL HIGH (ref 70–99)

## 2020-03-02 LAB — C-REACTIVE PROTEIN: CRP: 1.2 mg/dL — ABNORMAL HIGH (ref <1.0)

## 2020-03-02 MED ORDER — MELATONIN 5 MG PO TABS
10.0000 mg | ORAL_TABLET | Freq: Once | ORAL | Status: AC
  Administered 2020-03-02: 10 mg via ORAL
  Filled 2020-03-02: qty 2

## 2020-03-02 MED ORDER — HYDRALAZINE HCL 20 MG/ML IJ SOLN
10.0000 mg | Freq: Four times a day (QID) | INTRAMUSCULAR | Status: DC | PRN
  Administered 2020-03-02: 10 mg via INTRAVENOUS
  Filled 2020-03-02: qty 1

## 2020-03-02 MED ORDER — INSULIN ASPART 100 UNIT/ML ~~LOC~~ SOLN
10.0000 [IU] | Freq: Three times a day (TID) | SUBCUTANEOUS | Status: DC
  Administered 2020-03-02 – 2020-03-03 (×3): 10 [IU] via SUBCUTANEOUS

## 2020-03-02 NOTE — Progress Notes (Signed)
Inpatient Diabetes Program Recommendations  AACE/ADA: New Consensus Statement on Inpatient Glycemic Control (2015)  Target Ranges:  Prepandial:   less than 140 mg/dL      Peak postprandial:   less than 180 mg/dL (1-2 hours)      Critically ill patients:  140 - 180 mg/dL   Lab Results  Component Value Date   GLUCAP 307 (H) 03/02/2020   HGBA1C 7.0 (H) 02/04/2020    Review of Glycemic Control Results for CLENNON, NASCA (MRN [id number]) as of 03/02/2020 12:28  Ref. Range 03/01/2020 11:27 03/01/2020 16:40 03/01/2020 21:23 03/02/2020 07:46 03/02/2020 11:38  Glucose-Capillary Latest Ref Range: 70 - 99 mg/dL 189 (H) 250 (H) 230 (H) 109 (H)  Novolog 6 units 307 (H)   Diabetes history: DM 2 Outpatient Diabetes medications: metformin 1000 mg bid Current orders for Inpatient glycemic control:  Levemir 14 units bid Novolog 0-20 units tid + hs Novolog 6 units tid meal coverage  Solumedrol 40 mg Q12 hours Ensure Enlive bid between meals A1c 7% on 02/04/20  Inpatient Diabetes Program Recommendations:    - consider increasing Novolog meal coverage to 10 units tid. -  Add tradjenta 5 mg Daily Thanks,  Tama Headings RN, MSN, BC-ADM Inpatient Diabetes Coordinator Team Pager [phone] (8a-5p)

## 2020-03-02 NOTE — Progress Notes (Signed)
PROGRESS NOTE                                                                                                                                                                                                             Patient Demographics:    Brandon Santos, is a 86 y.o. male, DOB - 1933-12-26, FUX:[id number]  Outpatient Primary MD for the patient is Vivi Barrack, MD   Admit date - 02/28/2020   LOS - 6  Chief Complaint  Patient presents with  . Shortness of Breath       Brief Narrative: Patient is a 86 y.o. male with PMHx of HTN, HLD, DM-2-who was not feeling well for the past 9-10 days-presented to the ED with several days of worsening shortness of breath.  He was found to have acute hypoxic respiratory failure secondary to COVID-19 pneumonia and admitted to the hospitalist service.  Post admission-he deteriorated with worsening oxygen requirement-and was placed on 15 L HFNC with NRB.  COVID-19 vaccinated status: Unvaccinated  Significant Events: 9/2>> Admit to [facility] for for severe hypoxemia secondary to COVID-19 pneumonia  Significant studies: 9/2>>Chest x-ray: Patchy bilateral lung infiltrates 9/5>> renal ultrasound: Increased cortical echogenicity of both kidneys suggesting underlying medical renal disease, no hydronephrosis. 9/6>> chest x-ray: Stable bilateral lung opacities-concerning for multifocal pneumonia  COVID-19 medications: Steroids: 9/2>> Remdesivir: 9/2>>9/6 Actemra: 9/3 x 1  Antibiotics: None  Microbiology data: None  Procedures: None  Consults: None  DVT prophylaxis: Prophylactic Lovenox-at twice daily dosing.    Subjective:   Does not appear any different-appears weak and tired-but comfortable at rest.  Does acknowledge significant dyspnea with minimal exertion.  Remains on 15 L HFNC and NRB.   Assessment  & Plan :   Acute Hypoxic Resp Failure due to Covid 19 Viral pneumonia: He  continues to have severe hypoxemia-on 15 L HFNC and NRB-no evidence of volume overload-continue steroids.  Although he is severely hypoxemic-he appears comfortable.  Watch closely-if he deteriorates significantly-we will transfer to the ICU.  Fever: afebrile O2 requirements:  SpO2: 90 % O2 Flow Rate (L/min): 15 L/min FiO2 (%): 100 %   COVID-19 Labs: Recent Labs    02/29/20 0255 03/01/20 0307 03/02/20 1151  DDIMER 0.69* 0.73* 0.87*  FERRITIN 503* 494*  --   CRP 2.9* 1.6*  --  Component Value Date/Time   BNP 122.6 (H) 02/27/2020 0536    Recent Labs  Lab 02/29/2020 0801  PROCALCITON 0.22    Lab Results  Component Value Date   SARSCOV2NAA POSITIVE (A) 02/26/2020     Prone/Incentive Spirometry: encouraged  incentive spirometry use 3-4/hour, encourage prone position-2-3 hours at a time for up to 15 hours a day.  AKI: Likely hemodynamically mediated-somewhat better today-UA without proteinuria, renal ultrasound without hydronephrosis-but with suggestion of chronic medical renal disease.  Renal function slowly improving-but seems to have plateaued over the past few days-suspect he may have some underlying CKD at baseline.  Plan is to monitor closely-continue supportive care and avoid nephrotoxic agents.     . DM-2 (A1c 7.0 on 8/12) with uncontrolled hyperglycemia due to steroids: CBGs relatively stable-continue Levemir 14 units twice daily-increase Premeal NovoLog to 10 units-remains on resistant SSI.  Follow and adjust.     Recent Labs    03/01/20 2123 03/02/20 0746 03/02/20 1138  GLUCAP 230* 109* 307*   HTN: BP stable-continue Norvasc, metoprolol  HLD: Continue statin  Deconditioning/debility: Secondary to acute illness-PT following-recommendations are for SNF.  Obesity: Estimated body mass index is 30.25 kg/m as calculated from the following:   Height as of this encounter: 5\' 6"  (1.676 m).   Weight as of this encounter: 85 kg.   GI prophylaxis:  PPI  ABG: No results found for: PHART, PCO2ART, PO2ART, HCO3, TCO2, ACIDBASEDEF, O2SAT  Vent Settings: N/A  Condition - Extremely Guarded-very tenuous with risk for further deterioration  Family Communication  :  Son Milta Deiters [phone]) - on 9/8  Code Status :  Full Code  Diet :  Diet Order            Diet Carb Modified Fluid consistency: Thin; Room service appropriate? Yes  Diet effective now                  Disposition Plan  :   Status is: Inpatient  Remains inpatient appropriate because:Inpatient level of care appropriate due to severity of illness   Dispo: The patient is from: Home              Anticipated d/c is to: TBD              Anticipated d/c date is: > 3 days              Patient currently is not medically stable to d/c.   Barriers to discharge: Hypoxia requiring O2 supplementation/complete 5 days of IV Remdesivir  Antimicorbials  :    Anti-infectives (From admission, onward)   Start     Dose/Rate Route Frequency Ordered Stop   02/29/20 1045  remdesivir 100 mg in sodium chloride 0.9 % 100 mL IVPB        100 mg 200 mL/hr over 30 Minutes Intravenous Daily 02/29/20 1039 02/29/20 1135   02/26/20 1000  remdesivir 100 mg in sodium chloride 0.9 % 100 mL IVPB       "Followed by" Linked Group Details   100 mg 200 mL/hr over 30 Minutes Intravenous Daily 02/24/2020 1014 02/29/20 1140   03/01/2020 1100  remdesivir 200 mg in sodium chloride 0.9% 250 mL IVPB       "Followed by" Linked Group Details   200 mg 580 mL/hr over 30 Minutes Intravenous Once 03/08/2020 1014 03/15/2020 1327      Inpatient Medications  Scheduled Meds: . (feeding supplement) PROSource Plus  30 mL Oral Daily  . amLODipine  5  mg Oral Daily  . enoxaparin (LOVENOX) injection  40 mg Subcutaneous Q12H  . feeding supplement (ENSURE ENLIVE)  237 mL Oral BID BM  . insulin aspart  0-20 Units Subcutaneous TID WC  . insulin aspart  0-5 Units Subcutaneous QHS  . insulin aspart  6 Units Subcutaneous TID  WC  . insulin detemir  14 Units Subcutaneous BID  . latanoprost  1 drop Both Eyes QHS  . methylPREDNISolone (SOLU-MEDROL) injection  40 mg Intravenous Q12H  . metoprolol tartrate  25 mg Oral BID  . pantoprazole  40 mg Oral Q1200  . simvastatin  20 mg Oral QPM  . sodium chloride flush  3 mL Intravenous Q12H  . timolol  1 drop Both Eyes Daily   Continuous Infusions: . sodium chloride     PRN Meds:.sodium chloride, acetaminophen, albuterol, bisacodyl, chlorpheniramine-HYDROcodone, guaiFENesin-dextromethorphan, ondansetron **OR** ondansetron (ZOFRAN) IV, oxyCODONE, polyethylene glycol, sodium chloride flush, sodium phosphate   Time Spent in minutes  35   See all Orders from today for further details   Oren Binet M.D on 03/02/2020 at 1:44 PM  To page go to [email or web address] - use universal password  [facility] -  Office  [phone]    Objective:   Vitals:   03/02/20 0220 03/02/20 0315 03/02/20 0753 03/02/20 1208  BP:  (!) 177/88    Pulse:  68    Resp: (!) 24 15    Temp:  97.6 F (36.4 C) 97.8 F (36.6 C) 97.7 F (36.5 C)  TempSrc:  Axillary Axillary Axillary  SpO2:  90%    Weight:      Height:        Wt Readings from Last 3 Encounters:  02/26/20 85 kg  02/04/20 84.5 kg  08/10/19 85.4 kg     Intake/Output Summary (Last 24 hours) at 03/02/2020 1344 Last data filed at 03/02/2020 1300 Gross per 24 hour  Intake 540 ml  Output 700 ml  Net -160 ml     Physical Exam Gen Exam:Alert awake-not in any distress HEENT:atraumatic, normocephalic Chest: B/L clear to auscultation anteriorly CVS:S1S2 regular Abdomen:soft non tender, non distended Extremities:no edema Neurology: Non focal Skin: no rash   Data Review:    CBC Recent Labs  Lab 02/26/20 0239 02/26/20 0239 02/27/20 0536 02/28/20 0255 02/29/20 0255 03/01/20 0307 03/02/20 1151  WBC 10.1   < > 17.2* 16.1* 17.2* 20.1* 20.7*  HGB 13.8   < > 15.0 14.1 13.5 13.8 15.0  HCT 41.6   < > 44.4 42.5 39.5  40.6 43.5  PLT 255   < > 330 285 341 364 384  MCV 88.1   < > 86.0 87.1 85.1 85.5 86.1  MCH 29.2   < > 29.1 28.9 29.1 29.1 29.7  MCHC 33.2   < > 33.8 33.2 34.2 34.0 34.5  RDW 13.5   < > 13.5 13.4 13.3 13.3 13.2  LYMPHSABS 1.2  --  2.0 1.3 0.9 1.1  --   MONOABS 0.5  --  0.8 0.7 0.7 0.8  --   EOSABS 0.0  --  0.0 0.0 0.0 0.0  --   BASOSABS 0.0  --  0.0 0.0 0.0 0.0  --    < > = values in this interval not displayed.    Chemistries  Recent Labs  Lab 02/26/20 0239 02/26/20 0239 02/27/20 0536 02/28/20 0255 02/29/20 0255 03/01/20 0307 03/02/20 1151  NA 136   < > 137 139 141 142 137  K 4.5   < > 4.6  4.5 3.9 4.2 4.3  CL 102   < > 103 107 108 106 102  CO2 22   < > 19* 20* 20* 23 21*  GLUCOSE 269*   < > 201* 232* 88 109* 331*  BUN 38*   < > 49* 58* 49* 43* 44*  CREATININE 1.48*   < > 1.60* 1.66* 1.47* 1.32* 1.36*  CALCIUM 8.6*   < > 8.9 8.7* 8.6* 8.8* 8.5*  MG 1.9  --  2.3 2.2 2.1 2.1  --   AST 90*   < > 79* 59* 48* 43* 172*  ALT 44   < > 47* 41 43 46* 86*  ALKPHOS 47   < > 54 49 53 60 59  BILITOT 1.4*   < > 1.0 0.9 0.7 0.6 1.0   < > = values in this interval not displayed.   ------------------------------------------------------------------------------------------------------------------ No results for input(s): CHOL, HDL, LDLCALC, TRIG, CHOLHDL, LDLDIRECT in the last 72 hours.  Lab Results  Component Value Date   HGBA1C 7.0 (H) 02/04/2020   ------------------------------------------------------------------------------------------------------------------ No results for input(s): TSH, T4TOTAL, T3FREE, THYROIDAB in the last 72 hours.  Invalid input(s): FREET3 ------------------------------------------------------------------------------------------------------------------ Recent Labs    02/29/20 0255 03/01/20 0307  FERRITIN 503* 494*    Coagulation profile Recent Labs  Lab 03/10/2020 0801  INR 1.0    Recent Labs    03/01/20 0307 03/02/20 1151  DDIMER 0.73* 0.87*     Cardiac Enzymes No results for input(s): CKMB, TROPONINI, MYOGLOBIN in the last 168 hours.  Invalid input(s): CK ------------------------------------------------------------------------------------------------------------------    Component Value Date/Time   BNP 122.6 (H) 02/27/2020 0536    Micro Results Recent Results (from the past 240 hour(s))  SARS Coronavirus 2 by RT PCR (hospital order, performed in [facility] hospital lab) Nasopharyngeal Nasopharyngeal Swab     Status: Abnormal   Collection Time: 03/04/2020  7:55 AM   Specimen: Nasopharyngeal Swab  Result Value Ref Range Status   SARS Coronavirus 2 POSITIVE (A) NEGATIVE Final    Comment: emailed L. Berdik RN 10:00 03/01/2020 ([email or web address]) (NOTE) SARS-CoV-2 target nucleic acids are DETECTED  SARS-CoV-2 RNA is generally detectable in upper respiratory specimens  during the acute phase of infection.  Positive results are indicative  of the presence of the identified virus, but do not rule out bacterial infection or co-infection with other pathogens not detected by the test.  Clinical correlation with patient history and  other diagnostic information is necessary to determine patient infection status.  The expected result is negative.  Fact Sheet for Patients:   StrictlyIdeas.no   Fact Sheet for Healthcare Providers:   BankingDealers.co.za    This test is not yet approved or cleared by the [place] FDA and  has been authorized for detection and/or diagnosis of SARS-CoV-2 by FDA under an Emergency Use Authorization (EUA).  This EUA will remain in effect (meaning this test can be used) for the duration of  the  COVID-19 declaration under Section 564(b)(1) of the Act, 21 U.S.C. section 360-bbb-3(b)(1), unless the authorization is terminated or revoked sooner.  Performed at [facility], [place] [address], [place], [place] [address]     Radiology Reports US  RENAL  Result Date: 02/28/2020 CLINICAL DATA:  Acute renal failure EXAM: RENAL / URINARY TRACT ULTRASOUND COMPLETE COMPARISON:  None. FINDINGS: Right Kidney: Renal measurements: 9.2 x 5.2 x 5.0 cm = volume: 125 mL. Renal cortical echogenicity is diffusely increased suggesting changes of underlying medical renal disease. There is mild diffuse  renal cortical atrophy. No hydronephrosis. No intrarenal masses or calcifications. Left Kidney: Renal measurements: 9.1 x 5.2 x 4.5 cm = volume: 106 mL. Renal cortical echogenicity is diffusely increased suggesting changes of underlying medical renal disease. There is mild diffuse renal cortical atrophy. No hydronephrosis. No intrarenal masses or calcifications. 2 cm simple cortical cyst arises exophytically from the lower pole. Bladder: Is decompressed and is not well visualized. Other: None. IMPRESSION: 1. Increased cortical echogenicity of both kidneys suggesting changes of underlying medical renal disease. 2. Mild diffuse renal cortical atrophy. Electronically Signed   By: Fidela Salisbury MD   On: 02/28/2020 16:11   DG Chest Port 1 View  Result Date: 02/29/2020 CLINICAL DATA:  Shortness of breath, COVID-19. EXAM: PORTABLE CHEST 1 VIEW COMPARISON:  February 25, 2020. FINDINGS: Stable cardiomediastinal silhouette. No pneumothorax or pleural effusion is noted. Stable bilateral lung opacities are noted concerning for multifocal pneumonia due to COVID-19. Bony thorax is unremarkable. IMPRESSION: Stable bilateral lung opacities are noted concerning for multifocal pneumonia due to COVID-19. Electronically Signed   By: Marijo Conception M.D.   On: 02/29/2020 08:13   DG Chest Port 1 View  Result Date: 03/10/2020 CLINICAL DATA:  COVID shortness of breath EXAM: PORTABLE CHEST 1 VIEW COMPARISON:  January 14, 2014. FINDINGS: Trachea midline. Cardiomediastinal contours are stable. Interval development of patchy basilar and mid lung predominant opacities in the LEFT and RIGHT chest both  interstitial and airspace component. Process more pronounced at the LEFT lung base. Visualized skeletal structures on limited assessment without acute process. No sign of pleural effusion. IMPRESSION: Patchy basilar and mid lung predominant opacities in the LEFT and RIGHT chest both interstitial and airspace component, findings could be seen in the setting of viral or atypical pneumonia, including COVID-19 infection. Electronically Signed   By: Zetta Bills M.D.   On: 03/21/2020 08:12

## 2020-03-02 NOTE — Plan of Care (Signed)

## 2020-03-02 NOTE — Progress Notes (Signed)
Physical Therapy Treatment Patient Details Name: Brandon Santos MRN: [id number] DOB: 1934/04/18 Today's Date: 03/02/2020    History of Present Illness 86yo male not feeling well for 9-10 days with increasing SOB. Found to be covid positive. Quickly deteriorated and required 15LPM per NRB. PMH DM, HLD, HTN, lumbar fusion    PT Comments    Pt continues to be limited by respiratory status and weakness. Poor tolerance to activity. Continue to recommend SNF.    Follow Up Recommendations  SNF;Supervision/Assistance - 24 hour     Equipment Recommendations  Rolling walker with 5" wheels    Recommendations for Other Services       Precautions / Restrictions Precautions Precautions: Fall;Other (comment) Precaution Comments: watch O2    Mobility  Bed Mobility Overal bed mobility: Needs Assistance Bed Mobility: Supine to Sit     Supine to sit: Min assist     General bed mobility comments: Assist to initiate moving legs off bed. Assist to elevate trunk into sitting. Verbal/tactile cues for sequencing  Transfers Overall transfer level: Needs assistance Equipment used: Rolling walker (2 wheeled) Transfers: Sit to/from [facility] Sit to Stand: Min assist         General transfer comment: Assist for balance.   Ambulation/Gait Ambulation/Gait assistance: Min assist Gait Distance (Feet): 3 Feet Assistive device: Rolling walker (2 wheeled) Gait Pattern/deviations: Step-through pattern;Decreased step length - right;Decreased step length - left;Shuffle;Trunk flexed Gait velocity: decr Gait velocity interpretation: <1.31 ft/sec, indicative of household ambulator General Gait Details: Assist for balance.    Stairs             Wheelchair Mobility    Modified Rankin (Stroke Patients Only)       Balance Overall balance assessment: Needs assistance Sitting-balance support: Feet supported;No upper extremity supported Sitting balance-Leahy Scale: Good      Standing balance support: No upper extremity supported;During functional activity Standing balance-Leahy Scale: Fair Standing balance comment: Static standing with min guard                            Cognition Arousal/Alertness: Awake/alert Behavior During Therapy: WFL for tasks assessed/performed;Flat affect Overall Cognitive Status: No family/caregiver present to determine baseline cognitive functioning Area of Impairment: Safety/judgement;Memory                     Memory: Decreased short-term memory   Safety/Judgement: Decreased awareness of deficits     General Comments: Cues for sequencing of basic tasks (getting to the EOB)      Exercises General Exercises - Lower Extremity Ankle Circles/Pumps: AROM;Both;10 reps;Seated Quad Sets: AROM;Both;5 reps;Seated Hip ABduction/ADduction: AROM;Both;10 reps;Seated    General Comments General comments (skin integrity, edema, etc.): Pt on 15 L HFNC, 15 L NRB. At rest SpO2 90%. With mobility to EOB SpO2 to 79%. Recovers to high 80's after 3-4 minutes. Amb to chair with SpO2  78%. Recovers to 89% after 3-4 minutes.       Pertinent Vitals/Pain Pain Assessment: No/denies pain    Home Living                      Prior Function            PT Goals (current goals can now be found in the care plan section) Acute Rehab PT Goals Patient Stated Goal: get healthy again Progress towards PT goals: Not progressing toward goals - comment    Frequency  Min 3X/week      PT Plan Current plan remains appropriate    Co-evaluation              AM-PAC PT "6 Clicks" Mobility   Outcome Measure  Help needed turning from your back to your side while in a flat bed without using bedrails?: A Little Help needed moving from lying on your back to sitting on the side of a flat bed without using bedrails?: A Little Help needed moving to and from a bed to a chair (including a wheelchair)?: A Little Help  needed standing up from a chair using your arms (e.g., wheelchair or bedside chair)?: A Little Help needed to walk in hospital room?: A Lot Help needed climbing 3-5 steps with a railing? : Total 6 Click Score: 15    End of Session Equipment Utilized During Treatment: Oxygen Activity Tolerance: Patient limited by fatigue;Treatment limited secondary to medical complications (Comment) Patient left: in chair;with call bell/phone within reach   PT Visit Diagnosis: Unsteadiness on feet (R26.81);Difficulty in walking, not elsewhere classified (R26.2);Muscle weakness (generalized) (M62.81)     Time: 0240-9735 PT Time Calculation (min) (ACUTE ONLY): 20 min  Charges:  $Therapeutic Activity: 8-22 mins                     [place] Pager [phone] Office [place] 03/02/2020, 12:15 PM

## 2020-03-03 ENCOUNTER — Inpatient Hospital Stay (HOSPITAL_COMMUNITY): Payer: BC Managed Care – PPO

## 2020-03-03 LAB — GLUCOSE, CAPILLARY
Glucose-Capillary: 129 mg/dL — ABNORMAL HIGH (ref 70–99)
Glucose-Capillary: 74 mg/dL (ref 70–99)
Glucose-Capillary: 212 mg/dL — ABNORMAL HIGH (ref 70–99)
Glucose-Capillary: 155 mg/dL — ABNORMAL HIGH (ref 70–99)

## 2020-03-03 LAB — COMPREHENSIVE METABOLIC PANEL
ALT: 245 U/L — ABNORMAL HIGH (ref 0–44)
AST: 126 U/L — ABNORMAL HIGH (ref 15–41)
Albumin: 2.6 g/dL — ABNORMAL LOW (ref 3.5–5.0)
Alkaline Phosphatase: 63 U/L (ref 38–126)
Anion gap: 13 (ref 5–15)
BUN: 43 mg/dL — ABNORMAL HIGH (ref 8–23)
CO2: 21 mmol/L — ABNORMAL LOW (ref 22–32)
Calcium: 8.8 mg/dL — ABNORMAL LOW (ref 8.9–10.3)
Chloride: 105 mmol/L (ref 98–111)
Creatinine, Ser: 1.26 mg/dL — ABNORMAL HIGH (ref 0.61–1.24)
GFR calc Af Amer: >60 mL/min (ref >60)
GFR calc non Af Amer: 52 mL/min — ABNORMAL LOW (ref >60)
Glucose, Bld: 143 mg/dL — ABNORMAL HIGH (ref 70–99)
Potassium: 4.4 mmol/L (ref 3.5–5.1)
Sodium: 139 mmol/L (ref 135–145)
Total Bilirubin: 0.8 mg/dL (ref 0.3–1.2)
Total Protein: 5.7 g/dL — ABNORMAL LOW (ref 6.5–8.1)

## 2020-03-03 LAB — CBC
HCT: 43 % (ref 39.0–52.0)
Hemoglobin: 15 g/dL (ref 13.0–17.0)
MCH: 29.7 pg (ref 26.0–34.0)
MCHC: 34.9 g/dL (ref 30.0–36.0)
MCV: 85.1 fL (ref 80.0–100.0)
Platelets: 389 10*3/uL (ref 150–400)
RBC: 5.05 MIL/uL (ref 4.22–5.81)
RDW: 13.1 % (ref 11.5–15.5)
WBC: 19.7 10*3/uL — ABNORMAL HIGH (ref 4.0–10.5)
nRBC: 0.1 % (ref 0.0–0.2)

## 2020-03-03 LAB — MRSA PCR SCREENING: MRSA by PCR: NEGATIVE

## 2020-03-03 LAB — FERRITIN: Ferritin: 590 ng/mL — ABNORMAL HIGH (ref 24–336)

## 2020-03-03 LAB — D-DIMER, QUANTITATIVE: D-Dimer, Quant: 0.98 ug/mL-FEU — ABNORMAL HIGH (ref 0.00–0.50)

## 2020-03-03 LAB — C-REACTIVE PROTEIN: CRP: 0.6 mg/dL (ref <1.0)

## 2020-03-03 MED ORDER — METOPROLOL TARTRATE 5 MG/5ML IV SOLN
5.0000 mg | Freq: Four times a day (QID) | INTRAVENOUS | Status: DC
  Administered 2020-03-03 – 2020-03-08 (×19): 5 mg via INTRAVENOUS
  Filled 2020-03-03 (×19): qty 5

## 2020-03-03 MED ORDER — FUROSEMIDE 10 MG/ML IJ SOLN
40.0000 mg | Freq: Once | INTRAMUSCULAR | Status: AC
  Administered 2020-03-03: 40 mg via INTRAVENOUS
  Filled 2020-03-03: qty 4

## 2020-03-03 MED ORDER — ORAL CARE MOUTH RINSE
15.0000 mL | Freq: Two times a day (BID) | OROMUCOSAL | Status: DC
  Administered 2020-03-03 – 2020-03-08 (×12): 15 mL via OROMUCOSAL

## 2020-03-03 MED ORDER — SALINE SPRAY 0.65 % NA SOLN
1.0000 | NASAL | Status: DC | PRN
  Administered 2020-03-03 – 2020-03-07 (×4): 1 via NASAL
  Filled 2020-03-03: qty 44

## 2020-03-03 MED ORDER — AMLODIPINE BESYLATE 10 MG PO TABS
10.0000 mg | ORAL_TABLET | Freq: Every day | ORAL | Status: DC
  Administered 2020-03-03 – 2020-03-08 (×6): 10 mg via ORAL
  Filled 2020-03-03 (×6): qty 1

## 2020-03-03 MED ORDER — OXYMETAZOLINE HCL 0.05 % NA SOLN
2.0000 | Freq: Two times a day (BID) | NASAL | Status: AC
  Administered 2020-03-03 – 2020-03-05 (×6): 2 via NASAL
  Filled 2020-03-03: qty 30

## 2020-03-03 MED ORDER — INSULIN DETEMIR 100 UNIT/ML ~~LOC~~ SOLN
7.0000 [IU] | Freq: Two times a day (BID) | SUBCUTANEOUS | Status: DC
  Administered 2020-03-04 – 2020-03-09 (×12): 7 [IU] via SUBCUTANEOUS
  Filled 2020-03-03 (×15): qty 0.07

## 2020-03-03 MED ORDER — MELATONIN 5 MG PO TABS
10.0000 mg | ORAL_TABLET | Freq: Every evening | ORAL | Status: DC | PRN
  Filled 2020-03-03: qty 2

## 2020-03-03 NOTE — Progress Notes (Addendum)
Pt placed on HHFNC 35L/100% at this time. RT attempted to not place NRB back on pt, but he has little reserve and quickly desatted to around 74% with just [place]. NRB placed back on pt, and SpO2 is currently 89%, RR 19. Pt does not appear to be in any obvious respiratory distress at this time. RN aware of changes. RT will continue to monitor.

## 2020-03-03 NOTE — Progress Notes (Signed)
Inpatient Diabetes Program Recommendations  AACE/ADA: New Consensus Statement on Inpatient Glycemic Control (2015)  Target Ranges:  Prepandial:   less than 140 mg/dL      Peak postprandial:   less than 180 mg/dL (1-2 hours)      Critically ill patients:  140 - 180 mg/dL   Lab Results  Component Value Date   GLUCAP 129 (H) 03/03/2020   HGBA1C 7.0 (H) 02/04/2020    Review of Glycemic Control Results for Brandon Santos, Brandon Santos (MRN [id number]) as of 03/03/2020 10:36  Ref. Range 03/02/2020 07:46 03/02/2020 11:38 03/02/2020 16:09 03/02/2020 20:43 03/03/2020 07:56  Glucose-Capillary Latest Ref Range: 70 - 99 mg/dL 109 (H) 307 (H) 300 (H) 313 (H) 129 (H)    Diabetes history: DM 2 Outpatient Diabetes medications: metformin 1000 mg bid Current orders for Inpatient glycemic control:  Levemir 14 units bid Novolog 0-20 units tid + hs Novolog 10 units tid meal coverage  Solumedrol 40 mg Q12 hours Ensure Enlive bid between meals A1c 7% on 02/04/20  Inpatient Diabetes Program Recommendations:    -  Consider adding Tradjenta 5 mg Daily  Will follow trends to see if pt needs more meal coverage after increase from yesterday.   Thanks,  Tama Headings RN, MSN, BC-ADM Inpatient Diabetes Coordinator Team Pager [phone] (8a-5p)

## 2020-03-03 NOTE — Significant Event (Signed)
HOSPITAL MEDICINE OVERNIGHT EVENT NOTE    Notified by nursing that they have observed the patient to choking on water this evening and that they are uncomfortable administering any further oral meds.  This may be due to patients deteriorating oxygenation as he has just recently been switched to high flow this evening.  Will switch PO Metoprolol this evening to IV formulation with parameters, switch diet to NPO, cut basal insulin regimen in half, discontinue prandial insulin regimen and order SLP swallow eval for the morning.   Vernelle Emerald  MD [facility]

## 2020-03-03 NOTE — Plan of Care (Signed)
  Problem: Education: Goal: Knowledge of General Education information will improve Description: Including pain rating scale, medication(s)/side effects and non-pharmacologic comfort measures Outcome: Not Progressing   Problem: Clinical Measurements: Goal: Cardiovascular complication will be avoided Outcome: Progressing   Problem: Activity: Goal: Risk for activity intolerance will decrease Outcome: Not Progressing

## 2020-03-03 NOTE — Progress Notes (Signed)
Called RT has requested by MD to have pt placed on HHFNC. Spoke with Kathlee Nations in RT, Kathlee Nations stated she would come to floor momentarily to switch pt over to [facility].

## 2020-03-03 NOTE — Progress Notes (Signed)
Occupational Therapy Treatment Patient Details Name: Brandon Santos MRN: [id number] DOB: 04-27-1934 Today's Date: 03/03/2020    History of present illness 86yo male not feeling well for 9-10 days with increasing SOB. Found to be covid positive. Quickly deteriorated and required 15LPM per NRB. PMH DM, HLD, HTN, lumbar fusion   OT comments  Pt continues to present with decreased balance, strength, and activity tolerance. Pt presenting with decreased processing and requiring increased cues and time. Pt requiring Min A for functional transfers to recliner. SpO2 dropping to 79% on 15L via HFNC and 15L NRB. Pt SpO2 elevating to high 80s with seated rest break. Continue to recommend dc to SNF and will continue to follow acutely as admitted.   SpO2 90% at rest on 15L HFNC +15L NRB. SpO2 dropping to 79% with activity. RR 30-40s. HR 90s.    Follow Up Recommendations  SNF (HHOT if pt refuses SNF; wife in ICU with COVID)    Equipment Recommendations  Tub/shower seat;3 in 1 bedside commode    Recommendations for Other Services      Precautions / Restrictions Precautions Precautions: Fall;Other (comment) Precaution Comments: watch O2       Mobility Bed Mobility Overal bed mobility: Needs Assistance Bed Mobility: Supine to Sit     Supine to sit: Mod assist     General bed mobility comments: Mod A to elevate trunk  Transfers Overall transfer level: Needs assistance Equipment used: Rolling walker (2 wheeled) Transfers: Sit to/from [facility] Sit to Stand: Min assist Stand pivot transfers: Min assist       General transfer comment: Min A for power up into standing and then maintain balance    Balance Overall balance assessment: Needs assistance Sitting-balance support: Feet supported;No upper extremity supported Sitting balance-Leahy Scale: Good     Standing balance support: No upper extremity supported;During functional activity Standing balance-Leahy Scale:  Poor Standing balance comment: Reliant on UE support                           ADL either performed or assessed with clinical judgement   ADL Overall ADL's : Needs assistance/impaired                         Toilet Transfer: Minimal assistance;Stand-pivot;RW (simulated to recliner) Toilet Transfer Details (indicate cue type and reason): simulated to recliner without AD, cues for sequencing         Functional mobility during ADLs: Minimal assistance;Cueing for safety;Cueing for sequencing;Rolling walker (stand pivot) General ADL Comments: Pt continues to present with decreased balance, strength, and activity tolerance      Vision   Vision Assessment?: No apparent visual deficits   Perception     Praxis      Cognition Arousal/Alertness: Awake/alert Behavior During Therapy: WFL for tasks assessed/performed;Flat affect Overall Cognitive Status: No family/caregiver present to determine baseline cognitive functioning Area of Impairment: Safety/judgement;Memory;Following commands                     Memory: Decreased short-term memory Following Commands: Follows one step commands inconsistently;Follows one step commands with increased time Safety/Judgement: Decreased awareness of deficits     General Comments: Cues for sequencing of basic tasks (getting to the EOB)        Exercises     Shoulder Instructions       General Comments 15 L HFNC, 15 L NRB. At rest SpO2 90%. With  mobility to EOB SpO2 to 79%. Recovers to high 80's after 3-4 minutes. Cues for calm purse lip breathing.    Pertinent Vitals/ Pain       Pain Assessment: No/denies pain  Home Living                                          Prior Functioning/Environment              Frequency  Min 2X/week        Progress Toward Goals  OT Goals(current goals can now be found in the care plan section)  Progress towards OT goals: Progressing toward  goals  Acute Rehab OT Goals Patient Stated Goal: get healthy again OT Goal Formulation: With patient Time For Goal Achievement: 03/13/20 Potential to Achieve Goals: Good ADL Goals Pt Will Perform Grooming: with min assist;standing Pt Will Perform Lower Body Bathing: with min assist;sit to/from stand;sitting/lateral leans Pt Will Perform Lower Body Dressing: with min assist;sitting/lateral leans;sit to/from stand Pt Will Transfer to Toilet: with min guard assist;ambulating;regular height toilet Pt/caregiver will Perform Home Exercise Program: Increased strength;Both right and left upper extremity;With theraband Additional ADL Goal #1: Pt will recall and apply 3-5 ECS strategies to ADL routine  Plan Discharge plan remains appropriate    Co-evaluation                 AM-PAC OT "6 Clicks" Daily Activity     Outcome Measure   Help from another person eating meals?: A Little Help from another person taking care of personal grooming?: A Little Help from another person toileting, which includes using toliet, bedpan, or urinal?: A Lot Help from another person bathing (including washing, rinsing, drying)?: A Lot Help from another person to put on and taking off regular upper body clothing?: A Little Help from another person to put on and taking off regular lower body clothing?: A Lot 6 Click Score: 15    End of Session Equipment Utilized During Treatment: Rolling walker;Oxygen  OT Visit Diagnosis: Unsteadiness on feet (R26.81);Other abnormalities of gait and mobility (R26.89);Muscle weakness (generalized) (M62.81)   Activity Tolerance Patient limited by fatigue   Patient Left in chair;with call bell/phone within reach   Nurse Communication Mobility status;Other (comment) (O2)        Time: 2010-0712 OT Time Calculation (min): 18 min  Charges: OT General Charges $OT Visit: 1 Visit OT Treatments $Self Care/Home Management : 8-22 mins  [place], OTR/L Acute  Rehab Pager: [phone] Office: [place] 03/03/2020, 4:50 PM

## 2020-03-03 NOTE — Progress Notes (Signed)
PROGRESS NOTE                                                                                                                                                                                                             Patient Demographics:    Brandon Santos, is a 86 y.o. male, DOB - 1933-10-10, LMB:[id number]  Outpatient Primary MD for the patient is Vivi Barrack, MD   Admit date - 03/24/2020   LOS - 7  Chief Complaint  Patient presents with  . Shortness of Breath       Brief Narrative: Patient is a 86 y.o. male with PMHx of HTN, HLD, DM-2-who was not feeling well for the past 9-10 days-presented to the ED with several days of worsening shortness of breath.  He was found to have acute hypoxic respiratory failure secondary to COVID-19 pneumonia and admitted to the hospitalist service.  Post admission-he deteriorated with worsening oxygen requirement-and was placed on 15 L HFNC with NRB.  COVID-19 vaccinated status: Unvaccinated  Significant Events: 9/2>> Admit to [facility] for for severe hypoxemia secondary to COVID-19 pneumonia  Significant studies: 9/2>>Chest x-ray: Patchy bilateral lung infiltrates 9/5>> renal ultrasound: Increased cortical echogenicity of both kidneys suggesting underlying medical renal disease, no hydronephrosis. 9/6>> chest x-ray: Stable bilateral lung opacities-concerning for multifocal pneumonia 9/9>> diffuse bilateral interstitial changes  COVID-19 medications: Steroids: 9/2>> Remdesivir: 9/2>>9/6 Actemra: 9/3 x 1  Antibiotics: None  Microbiology data: None  Procedures: None  Consults: None  DVT prophylaxis: Prophylactic Lovenox-at twice daily dosing.    Subjective:   Seems to be slightly confused this morning but was easily redirectable-tomorrow I stayed in the room-the less confused he seemed to be-answering most of my questions appropriately.  Still remains very hypoxic-on 15 L  and NRB.  I attempted to take the NRB out-his O2 saturations dropped down to the low 10O-[id number] reapplication of NRB.   Assessment  & Plan :   Acute Hypoxic Resp Failure due to Covid 19 Viral pneumonia: He continues to be severely hypoxemic-no change since admission.  Continues to be on 15 L HFNC and NRB-although he has no signs of volume overload-we will give 1 dose of IV Lasix to try and maintain negative balance.  Continue steroids-has finished a course of remdesivir.  Chest x-ray done today appears essentially the same as a few days  prior.  He appears comfortable-but he is at high risk for further deterioration given advanced age/frailty and intermittent encephalopathy that seems to be developing-watch closely-if any significant deterioration-we will require transfer to the ICU.  Note-have had numerous discussions with the patient over the past few days-and also with the son-all realize that patient is very tenuous-at risk for further decompensation/deterioration-may not do well with aggressive care with intubation at this point-as he probably is in the fibrotic stage of ARDS.  However all desire to continue aggressive care including intubation/mechanical ventilation-he remains a full code.  Fever: afebrile O2 requirements:  SpO2: 90 % O2 Flow Rate (L/min): 15 L/min (+ 15 L NRB) FiO2 (%): 100 %   COVID-19 Labs: Recent Labs    03/01/20 0307 03/02/20 1151 03/03/20 0621  DDIMER 0.73* 0.87* 0.98*  FERRITIN 494* 535* 590*  CRP 1.6* 1.2* 0.6       Component Value Date/Time   BNP 122.6 (H) 02/27/2020 0536    No results for input(s): PROCALCITON in the last 168 hours.  Lab Results  Component Value Date   SARSCOV2NAA POSITIVE (A) 03/07/2020     Prone/Incentive Spirometry: encouraged  incentive spirometry use 3-4/hour, encourage prone position-2-3 hours at a time for up to 15 hours a day.  AKI: Likely hemodynamically mediated-somewhat better today-UA without proteinuria, renal  ultrasound without hydronephrosis-but with suggestion of chronic medical renal disease.  Renal function slowly improving-but looks like patient may have some amount of mild CKD at baseline.  Watch closely-giving IV Lasix today to see if that helps with respiration.  Acute metabolic encephalopathy: Suspect this is secondary to acute illness/hypoxemia due to COVID-19 pneumonia-it currently appears to be mild and intermittent.  Stable to monitor closely without any further work-up.  If confusion worsens-may need to check ABG to rule out hypercarbia and may need to consider imaging. Marland Kitchen DM-2 (A1c 7.0 on 8/12) with uncontrolled hyperglycemia due to steroids: CBGs relatively stable-continue Levemir 14 units twice daily-increase Premeal NovoLog to 10 units-remains on resistant SSI.  Follow and adjust.     Recent Labs    03/02/20 2043 03/03/20 0756 03/03/20 1139  GLUCAP 313* 129* 74   HTN: BP uncontrolled-increase Norvasc to 10 mg daily, remains on metoprolol.  Will get 1 dose of IV Lasix.  Follow and adjust.  HLD: Continue statin  Deconditioning/debility: Secondary to acute illness-PT following-recommendations are for SNF.  Obesity: Estimated body mass index is 30.25 kg/m as calculated from the following:   Height as of this encounter: 5\' 6"  (1.676 m).   Weight as of this encounter: 85 kg.   GI prophylaxis: PPI  ABG: No results found for: PHART, PCO2ART, PO2ART, HCO3, TCO2, ACIDBASEDEF, O2SAT  Vent Settings: N/A  Condition - Extremely Guarded-very tenuous with risk for further deterioration  Family Communication  :  Son Milta Deiters [phone]) - on 9/9  Code Status :  Full Code  Diet :  Diet Order            Diet Carb Modified Fluid consistency: Thin; Room service appropriate? Yes  Diet effective now                  Disposition Plan  :   Status is: Inpatient  Remains inpatient appropriate because:Inpatient level of care appropriate due to severity of illness   Dispo: The  patient is from: Home              Anticipated d/c is to: TBD  Anticipated d/c date is: > 3 days              Patient currently is not medically stable to d/c.   Barriers to discharge: Hypoxia requiring O2 supplementation/complete 5 days of IV Remdesivir  Antimicorbials  :    Anti-infectives (From admission, onward)   Start     Dose/Rate Route Frequency Ordered Stop   02/29/20 1045  remdesivir 100 mg in sodium chloride 0.9 % 100 mL IVPB        100 mg 200 mL/hr over 30 Minutes Intravenous Daily 02/29/20 1039 02/29/20 1135   02/26/20 1000  remdesivir 100 mg in sodium chloride 0.9 % 100 mL IVPB       "Followed by" Linked Group Details   100 mg 200 mL/hr over 30 Minutes Intravenous Daily 02/24/2020 1014 02/29/20 1140   03/04/2020 1100  remdesivir 200 mg in sodium chloride 0.9% 250 mL IVPB       "Followed by" Linked Group Details   200 mg 580 mL/hr over 30 Minutes Intravenous Once 03/13/2020 1014 02/28/2020 1327      Inpatient Medications  Scheduled Meds: . (feeding supplement) PROSource Plus  30 mL Oral Daily  . amLODipine  10 mg Oral Daily  . enoxaparin (LOVENOX) injection  40 mg Subcutaneous Q12H  . feeding supplement (ENSURE ENLIVE)  237 mL Oral BID BM  . insulin aspart  0-20 Units Subcutaneous TID WC  . insulin aspart  0-5 Units Subcutaneous QHS  . insulin aspart  10 Units Subcutaneous TID WC  . insulin detemir  14 Units Subcutaneous BID  . latanoprost  1 drop Both Eyes QHS  . mouth rinse  15 mL Mouth Rinse BID  . methylPREDNISolone (SOLU-MEDROL) injection  40 mg Intravenous Q12H  . metoprolol tartrate  25 mg Oral BID  . oxymetazoline  2 spray Each Nare BID  . pantoprazole  40 mg Oral Q1200  . simvastatin  20 mg Oral QPM  . sodium chloride flush  3 mL Intravenous Q12H  . timolol  1 drop Both Eyes Daily   Continuous Infusions: . sodium chloride     PRN Meds:.sodium chloride, acetaminophen, albuterol, bisacodyl, chlorpheniramine-HYDROcodone,  guaiFENesin-dextromethorphan, hydrALAZINE, melatonin, ondansetron **OR** ondansetron (ZOFRAN) IV, oxyCODONE, polyethylene glycol, sodium chloride, sodium chloride flush, sodium phosphate   Time Spent in minutes  35   See all Orders from today for further details   Oren Binet M.D on 03/03/2020 at 1:51 PM  To page go to [email or web address] - use universal password  [facility] -  Office  [phone]    Objective:   Vitals:   03/03/20 0400 03/03/20 0420 03/03/20 0729 03/03/20 1140  BP: (!) 166/69  (!) 160/71 (!) 147/64  Pulse: 72 78 79 67  Resp: (!) 22 (!) 25 19 20   Temp: 97.9 F (36.6 C)  (!) 97.4 F (36.3 C) 97.7 F (36.5 C)  TempSrc:   Axillary Oral  SpO2: (!) 89% (!) 86% 90% 90%  Weight:      Height:        Wt Readings from Last 3 Encounters:  02/26/20 85 kg  02/04/20 84.5 kg  08/10/19 85.4 kg     Intake/Output Summary (Last 24 hours) at 03/03/2020 1351 Last data filed at 03/03/2020 1321 Gross per 24 hour  Intake 859 ml  Output --  Net 859 ml     Physical Exam Gen Exam:Alert awake-not in any distress HEENT:atraumatic, normocephalic Chest: B/L clear to auscultation anteriorly CVS:S1S2 regular Abdomen:soft non tender, non distended  Extremities:no edema Neurology: Non focal Skin: no rash   Data Review:    CBC Recent Labs  Lab 02/26/20 0239 02/26/20 0239 02/27/20 0536 02/27/20 0536 02/28/20 0255 02/29/20 0255 03/01/20 0307 03/02/20 1151 03/03/20 0621  WBC 10.1   < > 17.2*   < > 16.1* 17.2* 20.1* 20.7* 19.7*  HGB 13.8   < > 15.0   < > 14.1 13.5 13.8 15.0 15.0  HCT 41.6   < > 44.4   < > 42.5 39.5 40.6 43.5 43.0  PLT 255   < > 330   < > 285 341 364 384 389  MCV 88.1   < > 86.0   < > 87.1 85.1 85.5 86.1 85.1  MCH 29.2   < > 29.1   < > 28.9 29.1 29.1 29.7 29.7  MCHC 33.2   < > 33.8   < > 33.2 34.2 34.0 34.5 34.9  RDW 13.5   < > 13.5   < > 13.4 13.3 13.3 13.2 13.1  LYMPHSABS 1.2  --  2.0  --  1.3 0.9 1.1  --   --   MONOABS 0.5  --  0.8  --  0.7  0.7 0.8  --   --   EOSABS 0.0  --  0.0  --  0.0 0.0 0.0  --   --   BASOSABS 0.0  --  0.0  --  0.0 0.0 0.0  --   --    < > = values in this interval not displayed.    Chemistries  Recent Labs  Lab 02/26/20 0239 02/26/20 0239 02/27/20 0536 02/27/20 0536 02/28/20 0255 02/29/20 0255 03/01/20 0307 03/02/20 1151 03/03/20 0621  NA 136   < > 137   < > 139 141 142 137 139  K 4.5   < > 4.6   < > 4.5 3.9 4.2 4.3 4.4  CL 102   < > 103   < > 107 108 106 102 105  CO2 22   < > 19*   < > 20* 20* 23 21* 21*  GLUCOSE 269*   < > 201*   < > 232* 88 109* 331* 143*  BUN 38*   < > 49*   < > 58* 49* 43* 44* 43*  CREATININE 1.48*   < > 1.60*   < > 1.66* 1.47* 1.32* 1.36* 1.26*  CALCIUM 8.6*   < > 8.9   < > 8.7* 8.6* 8.8* 8.5* 8.8*  MG 1.9  --  2.3  --  2.2 2.1 2.1  --   --   AST 90*   < > 79*   < > 59* 48* 43* 172* 126*  ALT 44   < > 47*   < > 41 43 46* 86* 245*  ALKPHOS 47   < > 54   < > 49 53 60 59 63  BILITOT 1.4*   < > 1.0   < > 0.9 0.7 0.6 1.0 0.8   < > = values in this interval not displayed.   ------------------------------------------------------------------------------------------------------------------ No results for input(s): CHOL, HDL, LDLCALC, TRIG, CHOLHDL, LDLDIRECT in the last 72 hours.  Lab Results  Component Value Date   HGBA1C 7.0 (H) 02/04/2020   ------------------------------------------------------------------------------------------------------------------ No results for input(s): TSH, T4TOTAL, T3FREE, THYROIDAB in the last 72 hours.  Invalid input(s): FREET3 ------------------------------------------------------------------------------------------------------------------ Recent Labs    03/02/20 1151 03/03/20 0621  FERRITIN 535* 590*    Coagulation profile No results for input(s): INR, PROTIME in  the last 168 hours.  Recent Labs    03/02/20 1151 03/03/20 0621  DDIMER 0.87* 0.98*    Cardiac Enzymes No results for input(s): CKMB, TROPONINI, MYOGLOBIN in the  last 168 hours.  Invalid input(s): CK ------------------------------------------------------------------------------------------------------------------    Component Value Date/Time   BNP 122.6 (H) 02/27/2020 0536    Micro Results Recent Results (from the past 240 hour(s))  SARS Coronavirus 2 by RT PCR (hospital order, performed in [facility] hospital lab) Nasopharyngeal Nasopharyngeal Swab     Status: Abnormal   Collection Time: 03/01/2020  7:55 AM   Specimen: Nasopharyngeal Swab  Result Value Ref Range Status   SARS Coronavirus 2 POSITIVE (A) NEGATIVE Final    Comment: emailed L. Berdik RN 10:00 02/28/2020 ([email or web address]) (NOTE) SARS-CoV-2 target nucleic acids are DETECTED  SARS-CoV-2 RNA is generally detectable in upper respiratory specimens  during the acute phase of infection.  Positive results are indicative  of the presence of the identified virus, but do not rule out bacterial infection or co-infection with other pathogens not detected by the test.  Clinical correlation with patient history and  other diagnostic information is necessary to determine patient infection status.  The expected result is negative.  Fact Sheet for Patients:   StrictlyIdeas.no   Fact Sheet for Healthcare Providers:   BankingDealers.co.za    This test is not yet approved or cleared by the [place] FDA and  has been authorized for detection and/or diagnosis of SARS-CoV-2 by FDA under an Emergency Use Authorization (EUA).  This EUA will remain in effect (meaning this test can be used) for the duration of  the  COVID-19 declaration under Section 564(b)(1) of the Act, 21 U.S.C. section 360-bbb-3(b)(1), unless the authorization is terminated or revoked sooner.  Performed at [facility], [place] [address] [place], [place] [address]   MRSA PCR Screening     Status: None   Collection Time: 03/03/20  5:57 AM   Specimen: Nasal Mucosa; Nasopharyngeal    Result Value Ref Range Status   MRSA by PCR NEGATIVE NEGATIVE Final    Comment:        The GeneXpert MRSA Assay (FDA approved for NASAL specimens only), is one component of a comprehensive MRSA colonization surveillance program. It is not intended to diagnose MRSA infection nor to guide or monitor treatment for MRSA infections. Performed at [facility], [place] [address], [place], [place] [address]     Radiology Reports US RENAL  Result Date: 02/28/2020 CLINICAL DATA:  Acute renal failure EXAM: RENAL / URINARY TRACT ULTRASOUND COMPLETE COMPARISON:  None. FINDINGS: Right Kidney: Renal measurements: 9.2 x 5.2 x 5.0 cm = volume: 125 mL. Renal cortical echogenicity is diffusely increased suggesting changes of underlying medical renal disease. There is mild diffuse renal cortical atrophy. No hydronephrosis. No intrarenal masses or calcifications. Left Kidney: Renal measurements: 9.1 x 5.2 x 4.5 cm = volume: 106 mL. Renal cortical echogenicity is diffusely increased suggesting changes of underlying medical renal disease. There is mild diffuse renal cortical atrophy. No hydronephrosis. No intrarenal masses or calcifications. 2 cm simple cortical cyst arises exophytically from the lower pole. Bladder: Is decompressed and is not well visualized. Other: None. IMPRESSION: 1. Increased cortical echogenicity of both kidneys suggesting changes of underlying medical renal disease. 2. Mild diffuse renal cortical atrophy. Electronically Signed   By: Fidela Salisbury MD   On: 02/28/2020 16:11   DG Chest Port 1 View  Result Date: 03/03/2020 CLINICAL DATA:  Shortness of breath.  COVID. EXAM: PORTABLE CHEST 1 VIEW COMPARISON:  02/29/2020. FINDINGS: Mediastinum hilar structures are unremarkable. Heart size normal. Low lung volumes. Diffuse prominent bilateral interstitial infiltrates again noted. Similar findings noted on prior exam. No prominent pleural effusion. No pneumothorax. Degenerative change thoracic  spine. IMPRESSION: Low lung volumes. Diffuse prominent bilateral interstitial filtrates again noted. Similar findings noted on prior exam. Electronically Signed   By: Wiley   On: 03/03/2020 07:04   DG Chest Port 1 View  Result Date: 02/29/2020 CLINICAL DATA:  Shortness of breath, COVID-19. EXAM: PORTABLE CHEST 1 VIEW COMPARISON:  February 25, 2020. FINDINGS: Stable cardiomediastinal silhouette. No pneumothorax or pleural effusion is noted. Stable bilateral lung opacities are noted concerning for multifocal pneumonia due to COVID-19. Bony thorax is unremarkable. IMPRESSION: Stable bilateral lung opacities are noted concerning for multifocal pneumonia due to COVID-19. Electronically Signed   By: Marijo Conception M.D.   On: 02/29/2020 08:13   DG Chest Port 1 View  Result Date: 03/17/2020 CLINICAL DATA:  COVID shortness of breath EXAM: PORTABLE CHEST 1 VIEW COMPARISON:  January 14, 2014. FINDINGS: Trachea midline. Cardiomediastinal contours are stable. Interval development of patchy basilar and mid lung predominant opacities in the LEFT and RIGHT chest both interstitial and airspace component. Process more pronounced at the LEFT lung base. Visualized skeletal structures on limited assessment without acute process. No sign of pleural effusion. IMPRESSION: Patchy basilar and mid lung predominant opacities in the LEFT and RIGHT chest both interstitial and airspace component, findings could be seen in the setting of viral or atypical pneumonia, including COVID-19 infection. Electronically Signed   By: Zetta Bills M.D.   On: 03/02/2020 08:12

## 2020-03-04 ENCOUNTER — Inpatient Hospital Stay (HOSPITAL_COMMUNITY): Payer: BC Managed Care – PPO

## 2020-03-04 LAB — BLOOD GAS, ARTERIAL
Acid-Base Excess: 3.9 mmol/L — ABNORMAL HIGH (ref 0.0–2.0)
Bicarbonate: 27.1 mmol/L (ref 20.0–28.0)
Drawn by: 418751
FIO2: 100
O2 Saturation: 87.1 %
Patient temperature: 37
pCO2 arterial: 35.1 mmHg (ref 32.0–48.0)
pH, Arterial: 7.5 — ABNORMAL HIGH (ref 7.350–7.450)
pO2, Arterial: 53.8 mmHg — ABNORMAL LOW (ref 83.0–108.0)

## 2020-03-04 LAB — COMPREHENSIVE METABOLIC PANEL
ALT: 171 U/L — ABNORMAL HIGH (ref 0–44)
AST: 49 U/L — ABNORMAL HIGH (ref 15–41)
Albumin: 2.9 g/dL — ABNORMAL LOW (ref 3.5–5.0)
Alkaline Phosphatase: 68 U/L (ref 38–126)
Anion gap: 15 (ref 5–15)
BUN: 51 mg/dL — ABNORMAL HIGH (ref 8–23)
CO2: 27 mmol/L (ref 22–32)
Calcium: 8.9 mg/dL (ref 8.9–10.3)
Chloride: 100 mmol/L (ref 98–111)
Creatinine, Ser: 1.42 mg/dL — ABNORMAL HIGH (ref 0.61–1.24)
GFR calc Af Amer: 53 mL/min — ABNORMAL LOW (ref >60)
GFR calc non Af Amer: 45 mL/min — ABNORMAL LOW (ref >60)
Glucose, Bld: 131 mg/dL — ABNORMAL HIGH (ref 70–99)
Potassium: 4 mmol/L (ref 3.5–5.1)
Sodium: 142 mmol/L (ref 135–145)
Total Bilirubin: 1 mg/dL (ref 0.3–1.2)
Total Protein: 6.1 g/dL — ABNORMAL LOW (ref 6.5–8.1)

## 2020-03-04 LAB — CBC
HCT: 49.1 % (ref 39.0–52.0)
Hemoglobin: 16.6 g/dL (ref 13.0–17.0)
MCH: 29.7 pg (ref 26.0–34.0)
MCHC: 33.8 g/dL (ref 30.0–36.0)
MCV: 87.8 fL (ref 80.0–100.0)
Platelets: 364 10*3/uL (ref 150–400)
RBC: 5.59 MIL/uL (ref 4.22–5.81)
RDW: 13.4 % (ref 11.5–15.5)
WBC: 21 10*3/uL — ABNORMAL HIGH (ref 4.0–10.5)
nRBC: 0.1 % (ref 0.0–0.2)

## 2020-03-04 LAB — GLUCOSE, CAPILLARY
Glucose-Capillary: 82 mg/dL (ref 70–99)
Glucose-Capillary: 188 mg/dL — ABNORMAL HIGH (ref 70–99)
Glucose-Capillary: 211 mg/dL — ABNORMAL HIGH (ref 70–99)
Glucose-Capillary: 123 mg/dL — ABNORMAL HIGH (ref 70–99)

## 2020-03-04 LAB — D-DIMER, QUANTITATIVE: D-Dimer, Quant: 0.68 ug/mL-FEU — ABNORMAL HIGH (ref 0.00–0.50)

## 2020-03-04 LAB — FERRITIN: Ferritin: 453 ng/mL — ABNORMAL HIGH (ref 24–336)

## 2020-03-04 LAB — C-REACTIVE PROTEIN: CRP: <0.5 mg/dL (ref <1.0)

## 2020-03-04 MED ORDER — RESOURCE THICKENUP CLEAR PO POWD
ORAL | Status: DC | PRN
  Filled 2020-03-04: qty 125

## 2020-03-04 NOTE — Progress Notes (Signed)
PT Cancellation Note  Patient Details Name: Brandon Santos MRN: [id number] DOB: 11-20-1933   Cancelled Treatment:    Reason Eval/Treat Not Completed: Other (comment).  PT went to check on pt and he had peeled his NRB mask off and was sating in the low 80s.  I returned it to his face (already also on [place]) and sats slowly rose to low 90s.  RR in the 40s, pt mildly confused perseverating on his son speaking with the nurse.  Food at bedside and hardly touched.  He looks exhausted.  I messaged Dr. Sloan Leiter and he is aware of pt's current state.  PT to hold therapy today as I do not think he could tolerate even EOB mobility.  He is positioned well in the bed and comfortable.   Thanks,  Verdene Lennert, PT, DPT  Acute Rehabilitation [phone] pager #[phone] office       Wells Guiles B Shrita Thien 03/04/2020, 2:54 PM

## 2020-03-04 NOTE — Progress Notes (Signed)
Pt had 2 episodes within the past 1 1/2 hours in which he pulled his oxygen off of his face. Pt's SpO2 dropped down into the 30s both occurrences. Pt's HHFNC increased to 50 L to help pt bring his SpO2 back up. Pt's SpO2 increased to 90% while on 50 L. Will slowly decrease pt's HF back down to 40 L as pt tolerates. Pt reminded not to pull off his oxygen. Pt verbalized understanding.

## 2020-03-04 NOTE — Progress Notes (Addendum)
PROGRESS NOTE                                                                                                                                                                                                             Patient Demographics:    Brandon Santos, is a 86 y.o. male, DOB - 1933-08-14, CBS:[id number]  Outpatient Primary MD for the patient is Vivi Barrack, MD   Admit date - 03/10/2020   LOS - 8  Chief Complaint  Patient presents with  . Shortness of Breath       Brief Narrative: Patient is a 86 y.o. male with PMHx of HTN, HLD, DM-2-who was not feeling well for the past 9-10 days-presented to the ED with several days of worsening shortness of breath.  He was found to have acute hypoxic respiratory failure secondary to COVID-19 pneumonia and admitted to the hospitalist service.  Post admission-he deteriorated with worsening oxygen requirement-and was placed on 15 L HFNC with NRB.  COVID-19 vaccinated status: Unvaccinated  Significant Events: 9/2>> Admit to [facility] for for severe hypoxemia secondary to COVID-19 pneumonia requiring 15 L of salter high flow  Significant studies: 9/2>>Chest x-ray: Patchy bilateral lung infiltrates 9/5>> renal ultrasound: Increased cortical echogenicity of both kidneys suggesting underlying medical renal disease, no hydronephrosis. 9/6>> chest x-ray: Stable bilateral lung opacities-concerning for multifocal pneumonia 9/9>> diffuse bilateral interstitial changes 9/9>> worsening hypoxemia-transition to heated high flow with NRB 9/10>> chest x-ray: Slight improvement in bilateral airspace disease when compared with the prior study.  COVID-19 medications: Steroids: 9/2>> Remdesivir: 9/2>>9/6 Actemra: 9/3 x 1  Antibiotics: None  Microbiology data: None  Procedures: None  Consults: None  DVT prophylaxis: Prophylactic Lovenox-at twice daily dosing.    Subjective:   Some  intermittent confusion continues-he was easily redirectable this morning.  He is now on heated high flow and NRB.  Per nursing staff-developed persistent choking last night-when taking medications-and subsequently kept NPO.   Assessment  & Plan :   Acute Hypoxic Resp Failure due to Covid 19 Viral pneumonia: Continues to have severe hypoxemia-he is now starting to develop some mild encephalopathy.  On 9/9-hypoxemia worsened slightly-and he was transitioned to heated high flow and NRB.  He has no signs of volume overload-was given 40 mg of IV Lasix yesterday to ensure negative balance (-3 L so far).  He  remains very tenuous-but is otherwise comfortable-he is at significant risk of deterioration at this point-we will continue to watch closely-and if he deteriorates any further-we will have low threshold to transfer to the ICU.  Long discussion again with patient's son-explained that patient continues to slowly deteriorate in spite of maximal medical care-explained that he probably will not do well with aggressive care including intubation and mechanical ventilation.  However family is not yet ready to give up-and want to continue with full scope of treatment.  Patient spouse is in the ICU and currently intubated.  Fever: afebrile O2 requirements:  SpO2: 90 % O2 Flow Rate (L/min): 40 L/min FiO2 (%): 100 %   COVID-19 Labs: Recent Labs    03/02/20 1151 03/03/20 0621 03/04/20 1206  DDIMER 0.87* 0.98* 0.68*  FERRITIN 535* 590*  --   CRP 1.2* 0.6  --        Component Value Date/Time   BNP 122.6 (H) 02/27/2020 0536    No results for input(s): PROCALCITON in the last 168 hours.  Lab Results  Component Value Date   SARSCOV2NAA POSITIVE (A) 03/09/2020     Prone/Incentive Spirometry: encouraged  incentive spirometry use 3-4/hour, encourage prone position-2-3 hours at a time for up to 15 hours a day.  AKI: Likely hemodynamically mediated-suspect has some amount of underlying CKD.  UA without  proteinuria, renal ultrasound without hydronephrosis-but with some suggestion of medical renal disease.  Given IV Lasix yesterday-some mild bump in creatinine today-continue to monitor closely.    Acute metabolic encephalopathy: Secondary to hypoxemia-acute illness-confusion is mostly intermittent-although confused-he is easily reoriented.  He has a nonfocal exam.  Stable for to be monitored closely-if his mental status worsens-we can contemplate doing ABG and imaging-but doubt this is required at this point.  Choking/dysphagia: Occurred last night-keep n.p.o.-pending SLP evaluation.  Given mental status-remains at high risk of aspiration- will only start diet after SLP eval.  DM-2 (A1c 7.0 on 8/12) with uncontrolled hyperglycemia due to steroids: CBGs stable-continue 7 units of Levemir twice daily and SSI-follow and adjust.  Currently n.p.o.-pending SLP evaluation-if diet resumed-may need further escalation in insulin dosing.    Recent Labs    03/03/20 2145 03/04/20 0809 03/04/20 1224  GLUCAP 155* 82 188*   HTN: BP relatively stable on Norvasc and metoprolol.  Follow and adjust.   HLD: Continue statin  Deconditioning/debility: Secondary to acute illness-PT following-recommendations are for SNF.  Obesity: Estimated body mass index is 30.25 kg/m as calculated from the following:   Height as of this encounter: 5\' 6"  (1.676 m).   Weight as of this encounter: 85 kg.   GI prophylaxis: PPI  ABG: No results found for: PHART, PCO2ART, PO2ART, HCO3, TCO2, ACIDBASEDEF, O2SAT  Vent Settings: N/A  Condition - Extremely Guarded-very tenuous with risk for further deterioration  Family Communication  :  Son Milta Deiters [phone]) -on 9/10  Code Status :  Full Code  Diet :  Diet Order            Diet Carb Modified Fluid consistency: Thin; Room service appropriate? Yes  Diet effective now                  Disposition Plan  :   Status is: Inpatient  Remains inpatient appropriate  because:Inpatient level of care appropriate due to severity of illness  Dispo: The patient is from: Home              Anticipated d/c is to: TBD  Anticipated d/c date is: > 3 days              Patient currently is not medically stable to d/c.   Barriers to discharge: Hypoxia requiring O2 supplementation  Antimicorbials  :    Anti-infectives (From admission, onward)   Start     Dose/Rate Route Frequency Ordered Stop   02/29/20 1045  remdesivir 100 mg in sodium chloride 0.9 % 100 mL IVPB        100 mg 200 mL/hr over 30 Minutes Intravenous Daily 02/29/20 1039 02/29/20 1135   02/26/20 1000  remdesivir 100 mg in sodium chloride 0.9 % 100 mL IVPB       "Followed by" Linked Group Details   100 mg 200 mL/hr over 30 Minutes Intravenous Daily 02/27/2020 1014 02/29/20 1140   03/10/2020 1100  remdesivir 200 mg in sodium chloride 0.9% 250 mL IVPB       "Followed by" Linked Group Details   200 mg 580 mL/hr over 30 Minutes Intravenous Once 03/08/2020 1014 02/26/2020 1327      Inpatient Medications  Scheduled Meds: . (feeding supplement) PROSource Plus  30 mL Oral Daily  . amLODipine  10 mg Oral Daily  . enoxaparin (LOVENOX) injection  40 mg Subcutaneous Q12H  . feeding supplement (ENSURE ENLIVE)  237 mL Oral BID BM  . insulin aspart  0-20 Units Subcutaneous TID WC  . insulin aspart  0-5 Units Subcutaneous QHS  . insulin detemir  7 Units Subcutaneous BID  . latanoprost  1 drop Both Eyes QHS  . mouth rinse  15 mL Mouth Rinse BID  . methylPREDNISolone (SOLU-MEDROL) injection  40 mg Intravenous Q12H  . metoprolol tartrate  5 mg Intravenous Q6H  . oxymetazoline  2 spray Each Nare BID  . pantoprazole  40 mg Oral Q1200  . simvastatin  20 mg Oral QPM  . sodium chloride flush  3 mL Intravenous Q12H  . timolol  1 drop Both Eyes Daily   Continuous Infusions: . sodium chloride     PRN Meds:.sodium chloride, acetaminophen, albuterol, bisacodyl, chlorpheniramine-HYDROcodone,  guaiFENesin-dextromethorphan, hydrALAZINE, melatonin, ondansetron **OR** ondansetron (ZOFRAN) IV, oxyCODONE, polyethylene glycol, sodium chloride, sodium chloride flush, sodium phosphate   Time Spent in minutes  35   See all Orders from today for further details   Oren Binet M.D on 03/04/2020 at 1:56 PM  To page go to [email or web address] - use universal password  [facility] -  Office  [phone]    Objective:   Vitals:   03/04/20 0405 03/04/20 0804 03/04/20 0923 03/04/20 1225  BP: (!) 156/79 (!) 179/78  (!) 146/72  Pulse: 79 76  82  Resp: 16 20  19   Temp: (!) 97.3 F (36.3 C) 98 F (36.7 C)  97.9 F (36.6 C)  TempSrc: Axillary Axillary  Axillary  SpO2: 90% 90% 90% 90%  Weight:      Height:        Wt Readings from Last 3 Encounters:  02/26/20 85 kg  02/04/20 84.5 kg  08/10/19 85.4 kg     Intake/Output Summary (Last 24 hours) at 03/04/2020 1356 Last data filed at 03/04/2020 0900 Gross per 24 hour  Intake 0 ml  Output 1150 ml  Net -1150 ml     Physical Exam Gen Exam:-Appears very frail-but able to speak in full sentences-somewhat confused but redirectable. HEENT:atraumatic, normocephalic Chest: B/L clear to auscultation anteriorly CVS:S1S2 regular Abdomen:soft non tender, non distended Extremities:no edema Neurology: Moving all 4 extremities Skin: no rash  Data Review:    CBC Recent Labs  Lab 02/27/20 0536 02/27/20 0536 02/28/20 0255 02/28/20 0255 02/29/20 0255 03/01/20 0307 03/02/20 1151 03/03/20 0621 03/04/20 1206  WBC 17.2*   < > 16.1*   < > 17.2* 20.1* 20.7* 19.7* 21.0*  HGB 15.0   < > 14.1   < > 13.5 13.8 15.0 15.0 16.6  HCT 44.4   < > 42.5   < > 39.5 40.6 43.5 43.0 49.1  PLT 330   < > 285   < > 341 364 384 389 364  MCV 86.0   < > 87.1   < > 85.1 85.5 86.1 85.1 87.8  MCH 29.1   < > 28.9   < > 29.1 29.1 29.7 29.7 29.7  MCHC 33.8   < > 33.2   < > 34.2 34.0 34.5 34.9 33.8  RDW 13.5   < > 13.4   < > 13.3 13.3 13.2 13.1 13.4    LYMPHSABS 2.0  --  1.3  --  0.9 1.1  --   --   --   MONOABS 0.8  --  0.7  --  0.7 0.8  --   --   --   EOSABS 0.0  --  0.0  --  0.0 0.0  --   --   --   BASOSABS 0.0  --  0.0  --  0.0 0.0  --   --   --    < > = values in this interval not displayed.    Chemistries  Recent Labs  Lab 02/27/20 0536 02/27/20 0536 02/28/20 0255 02/28/20 0255 02/29/20 0255 03/01/20 0307 03/02/20 1151 03/03/20 0621 03/04/20 1206  NA 137   < > 139   < > 141 142 137 139 142  K 4.6   < > 4.5   < > 3.9 4.2 4.3 4.4 4.0  CL 103   < > 107   < > 108 106 102 105 100  CO2 19*   < > 20*   < > 20* 23 21* 21* 27  GLUCOSE 201*   < > 232*   < > 88 109* 331* 143* 131*  BUN 49*   < > 58*   < > 49* 43* 44* 43* 51*  CREATININE 1.60*   < > 1.66*   < > 1.47* 1.32* 1.36* 1.26* 1.42*  CALCIUM 8.9   < > 8.7*   < > 8.6* 8.8* 8.5* 8.8* 8.9  MG 2.3  --  2.2  --  2.1 2.1  --   --   --   AST 79*   < > 59*   < > 48* 43* 172* 126* 49*  ALT 47*   < > 41   < > 43 46* 86* 245* 171*  ALKPHOS 54   < > 49   < > 53 60 59 63 68  BILITOT 1.0   < > 0.9   < > 0.7 0.6 1.0 0.8 1.0   < > = values in this interval not displayed.   ------------------------------------------------------------------------------------------------------------------ No results for input(s): CHOL, HDL, LDLCALC, TRIG, CHOLHDL, LDLDIRECT in the last 72 hours.  Lab Results  Component Value Date   HGBA1C 7.0 (H) 02/04/2020   ------------------------------------------------------------------------------------------------------------------ No results for input(s): TSH, T4TOTAL, T3FREE, THYROIDAB in the last 72 hours.  Invalid input(s): FREET3 ------------------------------------------------------------------------------------------------------------------ Recent Labs    03/02/20 1151 03/03/20 0621  FERRITIN 535* 590*    Coagulation profile No results for input(s): INR, PROTIME  in the last 168 hours.  Recent Labs    03/03/20 0621 03/04/20 1206  DDIMER  0.98* 0.68*    Cardiac Enzymes No results for input(s): CKMB, TROPONINI, MYOGLOBIN in the last 168 hours.  Invalid input(s): CK ------------------------------------------------------------------------------------------------------------------    Component Value Date/Time   BNP 122.6 (H) 02/27/2020 0536    Micro Results Recent Results (from the past 240 hour(s))  SARS Coronavirus 2 by RT PCR (hospital order, performed in [facility] hospital lab) Nasopharyngeal Nasopharyngeal Swab     Status: Abnormal   Collection Time: 03/16/2020  7:55 AM   Specimen: Nasopharyngeal Swab  Result Value Ref Range Status   SARS Coronavirus 2 POSITIVE (A) NEGATIVE Final    Comment: emailed L. Berdik RN 10:00 03/15/2020 ([email or web address]) (NOTE) SARS-CoV-2 target nucleic acids are DETECTED  SARS-CoV-2 RNA is generally detectable in upper respiratory specimens  during the acute phase of infection.  Positive results are indicative  of the presence of the identified virus, but do not rule out bacterial infection or co-infection with other pathogens not detected by the test.  Clinical correlation with patient history and  other diagnostic information is necessary to determine patient infection status.  The expected result is negative.  Fact Sheet for Patients:   StrictlyIdeas.no   Fact Sheet for Healthcare Providers:   BankingDealers.co.za    This test is not yet approved or cleared by the [place] FDA and  has been authorized for detection and/or diagnosis of SARS-CoV-2 by FDA under an Emergency Use Authorization (EUA).  This EUA will remain in effect (meaning this test can be used) for the duration of  the  COVID-19 declaration under Section 564(b)(1) of the Act, 21 U.S.C. section 360-bbb-3(b)(1), unless the authorization is terminated or revoked sooner.  Performed at [facility], [place] [address], [place], [place] [address]   MRSA PCR Screening      Status: None   Collection Time: 03/03/20  5:57 AM   Specimen: Nasal Mucosa; Nasopharyngeal  Result Value Ref Range Status   MRSA by PCR NEGATIVE NEGATIVE Final    Comment:        The GeneXpert MRSA Assay (FDA approved for NASAL specimens only), is one component of a comprehensive MRSA colonization surveillance program. It is not intended to diagnose MRSA infection nor to guide or monitor treatment for MRSA infections. Performed at [facility], [place] [address], [place], [place] [address]     Radiology Reports US RENAL  Result Date: 02/28/2020 CLINICAL DATA:  Acute renal failure EXAM: RENAL / URINARY TRACT ULTRASOUND COMPLETE COMPARISON:  None. FINDINGS: Right Kidney: Renal measurements: 9.2 x 5.2 x 5.0 cm = volume: 125 mL. Renal cortical echogenicity is diffusely increased suggesting changes of underlying medical renal disease. There is mild diffuse renal cortical atrophy. No hydronephrosis. No intrarenal masses or calcifications. Left Kidney: Renal measurements: 9.1 x 5.2 x 4.5 cm = volume: 106 mL. Renal cortical echogenicity is diffusely increased suggesting changes of underlying medical renal disease. There is mild diffuse renal cortical atrophy. No hydronephrosis. No intrarenal masses or calcifications. 2 cm simple cortical cyst arises exophytically from the lower pole. Bladder: Is decompressed and is not well visualized. Other: None. IMPRESSION: 1. Increased cortical echogenicity of both kidneys suggesting changes of underlying medical renal disease. 2. Mild diffuse renal cortical atrophy. Electronically Signed   By: Fidela Salisbury MD   On: 02/28/2020 16:11   DG Chest Port 1 View  Result Date: 03/03/2020 CLINICAL DATA:  Shortness of breath.  COVID. EXAM: PORTABLE CHEST 1 VIEW COMPARISON:  02/29/2020. FINDINGS: Mediastinum hilar structures are unremarkable. Heart size normal. Low lung volumes. Diffuse prominent bilateral interstitial infiltrates again noted. Similar findings noted on  prior exam. No prominent pleural effusion. No pneumothorax. Degenerative change thoracic spine. IMPRESSION: Low lung volumes. Diffuse prominent bilateral interstitial filtrates again noted. Similar findings noted on prior exam. Electronically Signed   By: [place]   On: 03/03/2020 07:04   DG Chest Port 1 View  Result Date: 02/29/2020 CLINICAL DATA:  Shortness of breath, COVID-19. EXAM: PORTABLE CHEST 1 VIEW COMPARISON:  February 25, 2020. FINDINGS: Stable cardiomediastinal silhouette. No pneumothorax or pleural effusion is noted. Stable bilateral lung opacities are noted concerning for multifocal pneumonia due to COVID-19. Bony thorax is unremarkable. IMPRESSION: Stable bilateral lung opacities are noted concerning for multifocal pneumonia due to COVID-19. Electronically Signed   By: Marijo Conception M.D.   On: 02/29/2020 08:13   DG Chest Port 1 View  Result Date: 03/10/2020 CLINICAL DATA:  COVID shortness of breath EXAM: PORTABLE CHEST 1 VIEW COMPARISON:  January 14, 2014. FINDINGS: Trachea midline. Cardiomediastinal contours are stable. Interval development of patchy basilar and mid lung predominant opacities in the LEFT and RIGHT chest both interstitial and airspace component. Process more pronounced at the LEFT lung base. Visualized skeletal structures on limited assessment without acute process. No sign of pleural effusion. IMPRESSION: Patchy basilar and mid lung predominant opacities in the LEFT and RIGHT chest both interstitial and airspace component, findings could be seen in the setting of viral or atypical pneumonia, including COVID-19 infection. Electronically Signed   By: Zetta Bills M.D.   On: 03/23/2020 08:12   DG Chest Port 1V same Day  Result Date: 03/04/2020 CLINICAL DATA:  COVID-19 positivity with hypoxia EXAM: PORTABLE CHEST 1 VIEW COMPARISON:  03/03/2020 FINDINGS: Cardiac shadow is stable. Bibasilar opacities are noted consistent with the given clinical history. The overall  appearance is slightly improved when compared with the prior study. No bony abnormality is noted. IMPRESSION: Slight improvement in bilateral airspace disease when compared with the prior study. Electronically Signed   By: Inez Catalina M.D.   On: 03/04/2020 08:09

## 2020-03-04 NOTE — Progress Notes (Signed)
Encouraged pt to lay on his stomach in prone position for a few hours. Pt was unable to tolerate this activity. Pt became highly anxious and started to panic when he was been turned around. Pt placed back in supine position as requested by pt.

## 2020-03-04 NOTE — Evaluation (Signed)
Clinical/Bedside Swallow Evaluation Patient Details  Name: Brandon Santos MRN: [id number] Date of Birth: 1934/03/11  Today's Date: 03/04/2020 Time: SLP Start Time (ACUTE ONLY): 1311 SLP Stop Time (ACUTE ONLY): 1328 SLP Time Calculation (min) (ACUTE ONLY): 17 min  Past Medical History:  Past Medical History:  Diagnosis Date  . BPH (benign prostatic hyperplasia)   . Colon polyp   . Diabetes mellitus without complication ([place])   . Hemorrhoid   . Hyperlipemia   . Hypertension   . Spinal stenosis    Past Surgical History:  Past Surgical History:  Procedure Laterality Date  . APPENDECTOMY  1966  . COLONOSCOPY  2013   had it close to end of year, exam was normal  . Drain fluid from lungs  1938   states "I was 86 year old"  . LUMBAR WOUND DEBRIDEMENT N/A 02/24/2014   Procedure: Revision of Lumbar Wound;  Surgeon: Eustace Moore, MD;  Location: [place] [facility];  Service: Neurosurgery;  Laterality: N/A;  Revision of Lumbar Wound  . MAXIMUM ACCESS (MAS)POSTERIOR LUMBAR INTERBODY FUSION (PLIF) 1 LEVEL N/A 01/21/2014   Procedure: FOR MAXIMUM ACCESS (MAS) POSTERIOR LUMBAR INTERBODY FUSION (PLIF) LUMBAR FOUR FIVE;  Surgeon: Eustace Moore, MD;  Location: [facility];  Service: Neurosurgery;  Laterality: N/A;   HPI:  86 yo male not feeling well for 9-10 days with increasing SOB. Found to be covid positive. Quickly deteriorated and required 15LPM per NRB. PMH DM, HLD, HTN, lumbar fusion. Pt confused yesterday, initiated HFNC and choked on a pill with water. Today, RN reported he is less confused and did well with pill whole in applesauce today. CXR Slight improvement in bilateral airspace disease when compared with   Assessment / Plan / Recommendation Clinical Impression  Pt currently on 100% FiO2, 40 L HFNC and non rebreather mask. Encountered awake however significantly deconditioned, fatigued and ill appearing. Sitting up with strawberries, canteloupe, grapes on lunch tray. Mastication with strawberry  was rapid and appeared short in duration. Intake was minimal and he declined more than 2 bites fruit and 3 straw sips liquid. Baseline RR mid 20's -lower 30's and SpO2 around 91-92% and did not significantly change during evaluation. There were no s/s aspiration at present time however opportunity is heightened given respiratory status d/t Covid. Recommend downgrading texture to Dys 2 and nectar thick liquids with ST planning to check tomorrow if able. Continue meds whole in puree.            SLP Visit Diagnosis: Dysphagia, unspecified (R13.10)    Aspiration Risk  Moderate aspiration risk    Diet Recommendation Dysphagia 2 (Fine chop);Nectar-thick liquid   Liquid Administration via: Cup;Straw Medication Administration: Whole meds with puree Supervision: Staff to assist with self feeding Compensations: Slow rate;Small sips/bites;Lingual sweep for clearance of pocketing Postural Changes: Seated upright at 90 degrees    Other  Recommendations Oral Care Recommendations: Oral care BID Other Recommendations: Order thickener from pharmacy   Follow up Recommendations Other (comment) (TBD)      Frequency and Duration min 2x/week  2 weeks       Prognosis Prognosis for Safe Diet Advancement: Fair Barriers to Reach Goals: Other (Comment) (respiratory status)      Swallow Study   General HPI: 86 yo male not feeling well for 9-10 days with increasing SOB. Found to be covid positive. Quickly deteriorated and required 15LPM per NRB. PMH DM, HLD, HTN, lumbar fusion. Pt confused yesterday, initiated HFNC and choked on a pill with water.  Today, RN reported he is less confused and did well with pill whole in applesauce today. CXR Slight improvement in bilateral airspace disease when compared with Type of Study: Bedside Swallow Evaluation Previous Swallow Assessment:  (none) Diet Prior to this Study: Regular;Thin liquids Temperature Spikes Noted: No Respiratory Status:  (non rebreather, HFNC) History  of Recent Intubation: No Behavior/Cognition: Requires cueing;Lethargic/Drowsy Oral Cavity Assessment: Dry Oral Care Completed by SLP: No Oral Cavity - Dentition: Other (Comment) (unable to fully view) Vision: Functional for self-feeding Self-Feeding Abilities: Needs assist (due to fatigue) Patient Positioning: Upright in bed Baseline Vocal Quality: Normal    Oral/Motor/Sensory Function Overall Oral Motor/Sensory Function: Within functional limits   Ice Chips Ice chips: Not tested   Thin Liquid Thin Liquid: Within functional limits Presentation: Straw    Nectar Thick Nectar Thick Liquid: Not tested   Honey Thick Honey Thick Liquid: Not tested   Puree Puree: Not tested   Solid     Solid: Impaired Oral Phase Impairments: Other (comment) (rapid duration of mastication) Pharyngeal Phase Impairments:  (none- respirations did not change)      Mick Sell, [name] 03/04/2020,3:24 PM  [name] [place].Ed Risk analyst [phone] Office [phone]

## 2020-03-05 LAB — D-DIMER, QUANTITATIVE: D-Dimer, Quant: 0.64 ug/mL-FEU — ABNORMAL HIGH (ref 0.00–0.50)

## 2020-03-05 LAB — GLUCOSE, CAPILLARY
Glucose-Capillary: 142 mg/dL — ABNORMAL HIGH (ref 70–99)
Glucose-Capillary: 216 mg/dL — ABNORMAL HIGH (ref 70–99)
Glucose-Capillary: 191 mg/dL — ABNORMAL HIGH (ref 70–99)
Glucose-Capillary: 166 mg/dL — ABNORMAL HIGH (ref 70–99)

## 2020-03-05 LAB — CBC
HCT: 45 % (ref 39.0–52.0)
Hemoglobin: 15.3 g/dL (ref 13.0–17.0)
MCH: 29.5 pg (ref 26.0–34.0)
MCHC: 34 g/dL (ref 30.0–36.0)
MCV: 86.9 fL (ref 80.0–100.0)
Platelets: 368 10*3/uL (ref 150–400)
RBC: 5.18 MIL/uL (ref 4.22–5.81)
RDW: 13.3 % (ref 11.5–15.5)
WBC: 20.2 10*3/uL — ABNORMAL HIGH (ref 4.0–10.5)
nRBC: 0 % (ref 0.0–0.2)

## 2020-03-05 LAB — COMPREHENSIVE METABOLIC PANEL
ALT: 168 U/L — ABNORMAL HIGH (ref 0–44)
AST: 53 U/L — ABNORMAL HIGH (ref 15–41)
Albumin: 2.6 g/dL — ABNORMAL LOW (ref 3.5–5.0)
Alkaline Phosphatase: 64 U/L (ref 38–126)
Anion gap: 13 (ref 5–15)
BUN: 59 mg/dL — ABNORMAL HIGH (ref 8–23)
CO2: 27 mmol/L (ref 22–32)
Calcium: 8.9 mg/dL (ref 8.9–10.3)
Chloride: 102 mmol/L (ref 98–111)
Creatinine, Ser: 1.53 mg/dL — ABNORMAL HIGH (ref 0.61–1.24)
GFR calc Af Amer: 48 mL/min — ABNORMAL LOW (ref >60)
GFR calc non Af Amer: 41 mL/min — ABNORMAL LOW (ref >60)
Glucose, Bld: 167 mg/dL — ABNORMAL HIGH (ref 70–99)
Potassium: 4.4 mmol/L (ref 3.5–5.1)
Sodium: 142 mmol/L (ref 135–145)
Total Bilirubin: 1 mg/dL (ref 0.3–1.2)
Total Protein: 5.5 g/dL — ABNORMAL LOW (ref 6.5–8.1)

## 2020-03-05 LAB — FERRITIN: Ferritin: 411 ng/mL — ABNORMAL HIGH (ref 24–336)

## 2020-03-05 LAB — C-REACTIVE PROTEIN: CRP: 0.6 mg/dL (ref <1.0)

## 2020-03-05 MED ORDER — CHLORHEXIDINE GLUCONATE 0.12 % MT SOLN
15.0000 mL | Freq: Two times a day (BID) | OROMUCOSAL | Status: DC
  Administered 2020-03-05 – 2020-03-08 (×9): 15 mL via OROMUCOSAL
  Filled 2020-03-05 (×9): qty 15

## 2020-03-05 NOTE — Significant Event (Signed)
HOSPITAL MEDICINE OVERNIGHT EVENT NOTE    Alerted by nursing to increasing oxygen requirements, as high as 40liters heated high flow.   ABG performed did reveal substantial hypoxemia with PaO2 of 53.8.  Rapid response evaluated the patient.  Patient does not appear to be in respiratory distress, no evidence of tachepnia.    Throughout the evening O2 requirements have slowly decreased, now on 30 liters as of 345am  Continuing to monitor closely, will call CCM if deteriorates.  Vernelle Emerald  MD [facility]

## 2020-03-05 NOTE — Progress Notes (Signed)
PROGRESS NOTE                                                                                                                                                                                                             Patient Demographics:    Brandon Santos, is a 86 y.o. male, DOB - [birth year], ZOX:[id number]  Outpatient Primary MD for the patient is Vivi Barrack, MD   Admit date - 03/23/2020   LOS - 9  Chief Complaint  Patient presents with  . Shortness of Breath       Brief Narrative: Patient is a 86 y.o. male with PMHx of HTN, HLD, DM-2-who was not feeling well for the past 9-10 days-presented to the ED with several days of worsening shortness of breath.  He was found to have acute hypoxic respiratory failure secondary to COVID-19 pneumonia and admitted to the hospitalist service.  Post admission-he deteriorated with worsening oxygen requirement-and was placed on 15 L HFNC with NRB.  COVID-19 vaccinated status: Unvaccinated  Significant Events: 9/2>> Admit to [facility] for for severe hypoxemia secondary to COVID-19 pneumonia requiring 15 L of salter high flow  Significant studies: 9/2>>Chest x-ray: Patchy bilateral lung infiltrates 9/5>> renal ultrasound: Increased cortical echogenicity of both kidneys suggesting underlying medical renal disease, no hydronephrosis. 9/6>> chest x-ray: Stable bilateral lung opacities-concerning for multifocal pneumonia 9/9>> diffuse bilateral interstitial changes 9/9>> worsening hypoxemia-transition to heated high flow with NRB 9/10>> chest x-ray: Slight improvement in bilateral airspace disease when compared with the prior study.  COVID-19 medications: Steroids: 9/2>> Remdesivir: 9/2>>9/6 Actemra: 9/3 x 1  Antibiotics: None  Microbiology data: None  Procedures: None  Consults: None  DVT prophylaxis: Prophylactic Lovenox-at twice daily dosing.    Subjective:   Remains  essentially unchanged compared to yesterday-on heated high flow and NRB-intermittent confusion continues.  Diet remains erratic as well.  He did not tolerate proning last night.  When evaluated this morning-he appeared comfortable-following commands-and did not appear confused to me.  But later this afternoon-Per nursing report-he has developed intermittent confusion.    Assessment  & Plan :   Acute Hypoxic Resp Failure due to Covid 19 Viral pneumonia: Continues to have severe hypoxemia-remains on heated high flow and NRB.  No signs of volume overload (neg balance of 3.2 L so far).  Although severely hypoxemic-he appears comfortable.  Plan is to continue supportive care-steroids-and await for parenchymal/clinical recovery.  However if he deteriorates in the meantime-we will have a low threshold to transfer to the ICU.   Long discussion again with patient's son-explained that patient continues to slowly deteriorate in spite of maximal medical care-explained that he probably will not do well with aggressive care including intubation and mechanical ventilation.  However family is not yet ready to give up-and want to continue with full scope of treatment.  Patient spouse is in the ICU and currently intubated.  Fever: afebrile O2 requirements:  SpO2: 95 % O2 Flow Rate (L/min): 30 L/min FiO2 (%): 100 %   COVID-19 Labs: Recent Labs    03/03/20 0621 03/04/20 1206 03/05/20 0300  DDIMER 0.98* 0.68* 0.64*  FERRITIN 590* 453* 411*  CRP 0.6 <0.5 0.6       Component Value Date/Time   BNP 122.6 (H) 02/27/2020 0536    No results for input(s): PROCALCITON in the last 168 hours.  Lab Results  Component Value Date   SARSCOV2NAA POSITIVE (A) 02/29/2020     Prone/Incentive Spirometry: encouraged  incentive spirometry use 3-4/hour, encourage prone position-2-3 hours at a time for up to 15 hours a day.  AKI: Likely hemodynamically mediated-suspect has some amount of underlying CKD.  UA without  proteinuria, renal ultrasound without hydronephrosis-but with some suggestion of medical renal disease.  Given IV Lasix yesterday-some mild bump in creatinine today-continue to monitor closely.    Transaminitis: Appears to be mild-stable for close monitoring-abdominal exam is benign.  Acute metabolic encephalopathy: Secondary to hypoxemia/acute illness-relatively awake and alert this morning.  However he does get intermittently confused.  Continue to maintain delirium precautions.  He has a nonfocal exam.  ABG last night did not show any hypercarbia.  Continue to monitor closely-do not think he requires any imaging at this point.  Choking/dysphagia: Occurred on 9/9-kept n.p.o.-SLP evaluation performed-on dysphagia 2 diet.  Per nursing staff-no obvious coughing spells or overt aspiration when eating.  DM-2 (A1c 7.0 on 8/12) with uncontrolled hyperglycemia due to steroids: CBGs stable-continue 7 units of Levemir twice daily and SSI.  Follow and adjust accordingly.     Recent Labs    03/04/20 2119 03/05/20 0802 03/05/20 1158  GLUCAP 123* 142* 216*   HTN: BP relatively stable on Norvasc and metoprolol.  Follow and adjust.   HLD: Continue statin  Deconditioning/debility: Secondary to acute illness-PT following-recommendations are for SNF.  Obesity: Estimated body mass index is 30.25 kg/m as calculated from the following:   Height as of this encounter: 5\' 6"  (1.676 m).   Weight as of this encounter: 85 kg.   GI prophylaxis: PPI  ABG:    Component Value Date/Time   PHART 7.500 (H) 03/04/2020 2220   PCO2ART 35.1 03/04/2020 2220   PO2ART 53.8 (L) 03/04/2020 2220   HCO3 27.1 03/04/2020 2220   O2SAT 87.1 03/04/2020 2220    Vent Settings: N/A  Condition - Extremely Guarded-very tenuous with risk for further deterioration  Family Communication  :  Son Milta Deiters [phone] 9/11  Code Status :  Full Code  Diet :  Diet Order            DIET DYS 2 Room service appropriate? No;  Fluid consistency: Nectar Thick  Diet effective now                  Disposition Plan  :   Status is: Inpatient  Remains inpatient appropriate because:Inpatient level of care appropriate due to severity  of illness  Dispo: The patient is from: Home              Anticipated d/c is to: TBD              Anticipated d/c date is: > 3 days              Patient currently is not medically stable to d/c.   Barriers to discharge: Hypoxia requiring O2 supplementation  Antimicorbials  :    Anti-infectives (From admission, onward)   Start     Dose/Rate Route Frequency Ordered Stop   02/29/20 1045  remdesivir 100 mg in sodium chloride 0.9 % 100 mL IVPB        100 mg 200 mL/hr over 30 Minutes Intravenous Daily 02/29/20 1039 02/29/20 1135   02/26/20 1000  remdesivir 100 mg in sodium chloride 0.9 % 100 mL IVPB       "Followed by" Linked Group Details   100 mg 200 mL/hr over 30 Minutes Intravenous Daily 03/05/2020 1014 02/29/20 1140   03/21/2020 1100  remdesivir 200 mg in sodium chloride 0.9% 250 mL IVPB       "Followed by" Linked Group Details   200 mg 580 mL/hr over 30 Minutes Intravenous Once 03/08/2020 1014 03/24/2020 1327      Inpatient Medications  Scheduled Meds: . (feeding supplement) PROSource Plus  30 mL Oral Daily  . amLODipine  10 mg Oral Daily  . chlorhexidine  15 mL Mouth Rinse BID  . enoxaparin (LOVENOX) injection  40 mg Subcutaneous Q12H  . feeding supplement (ENSURE ENLIVE)  237 mL Oral BID BM  . insulin aspart  0-20 Units Subcutaneous TID WC  . insulin aspart  0-5 Units Subcutaneous QHS  . insulin detemir  7 Units Subcutaneous BID  . latanoprost  1 drop Both Eyes QHS  . mouth rinse  15 mL Mouth Rinse BID  . methylPREDNISolone (SOLU-MEDROL) injection  40 mg Intravenous Q12H  . metoprolol tartrate  5 mg Intravenous Q6H  . oxymetazoline  2 spray Each Nare BID  . pantoprazole  40 mg Oral Q1200  . simvastatin  20 mg Oral QPM  . sodium chloride flush  3 mL Intravenous Q12H    . timolol  1 drop Both Eyes Daily   Continuous Infusions: . sodium chloride     PRN Meds:.sodium chloride, acetaminophen, albuterol, bisacodyl, chlorpheniramine-HYDROcodone, guaiFENesin-dextromethorphan, hydrALAZINE, melatonin, ondansetron **OR** ondansetron (ZOFRAN) IV, oxyCODONE, polyethylene glycol, Resource ThickenUp Clear, sodium chloride, sodium chloride flush, sodium phosphate   Time Spent in minutes  35   See all Orders from today for further details   Oren Binet M.D on 03/05/2020 at 2:06 PM  To page go to [email or web address] - use universal password  [facility] -  Office  [phone]    Objective:   Vitals:   03/05/20 0400 03/05/20 0536 03/05/20 0758 03/05/20 1152  BP: (!) 153/75 129/81 (!) 146/62 (!) 161/68  Pulse: 72 73 86 92  Resp: (!) 21 20 20 20   Temp: 97.8 F (36.6 C)  98.6 F (37 C) 97.9 F (36.6 C)  TempSrc: Axillary  Oral Axillary  SpO2: 96% 95% 93% 95%  Weight:      Height:        Wt Readings from Last 3 Encounters:  02/26/20 85 kg  02/04/20 84.5 kg  08/10/19 85.4 kg     Intake/Output Summary (Last 24 hours) at 03/05/2020 1406 Last data filed at 03/05/2020 0900 Gross per 24 hour  Intake 270 ml  Output 500 ml  Net -230 ml     Physical Exam Gen Exam: Frail-weak-following commands this morning. HEENT:atraumatic, normocephalic Chest: B/L clear to auscultation anteriorly CVS:S1S2 regular Abdomen:soft non tender, non distended Extremities:no edema Neurology: Non focal-but with generalized weakness Skin: no rash   Data Review:    CBC Recent Labs  Lab 02/28/20 0255 02/28/20 0255 02/29/20 0255 02/29/20 0255 03/01/20 0307 03/02/20 1151 03/03/20 0621 03/04/20 1206 03/05/20 0300  WBC 16.1*   < > 17.2*   < > 20.1* 20.7* 19.7* 21.0* 20.2*  HGB 14.1   < > 13.5   < > 13.8 15.0 15.0 16.6 15.3  HCT 42.5   < > 39.5   < > 40.6 43.5 43.0 49.1 45.0  PLT 285   < > 341   < > 364 384 389 364 368  MCV 87.1   < > 85.1   < > 85.5 86.1  85.1 87.8 86.9  MCH 28.9   < > 29.1   < > 29.1 29.7 29.7 29.7 29.5  MCHC 33.2   < > 34.2   < > 34.0 34.5 34.9 33.8 34.0  RDW 13.4   < > 13.3   < > 13.3 13.2 13.1 13.4 13.3  LYMPHSABS 1.3  --  0.9  --  1.1  --   --   --   --   MONOABS 0.7  --  0.7  --  0.8  --   --   --   --   EOSABS 0.0  --  0.0  --  0.0  --   --   --   --   BASOSABS 0.0  --  0.0  --  0.0  --   --   --   --    < > = values in this interval not displayed.    Chemistries  Recent Labs  Lab 02/28/20 0255 02/28/20 0255 02/29/20 0255 02/29/20 0255 03/01/20 0307 03/02/20 1151 03/03/20 0621 03/04/20 1206 03/05/20 0300  NA 139   < > 141   < > 142 137 139 142 142  K 4.5   < > 3.9   < > 4.2 4.3 4.4 4.0 4.4  CL 107   < > 108   < > 106 102 105 100 102  CO2 20*   < > 20*   < > 23 21* 21* 27 27  GLUCOSE 232*   < > 88   < > 109* 331* 143* 131* 167*  BUN 58*   < > 49*   < > 43* 44* 43* 51* 59*  CREATININE 1.66*   < > 1.47*   < > 1.32* 1.36* 1.26* 1.42* 1.53*  CALCIUM 8.7*   < > 8.6*   < > 8.8* 8.5* 8.8* 8.9 8.9  MG 2.2  --  2.1  --  2.1  --   --   --   --   AST 59*   < > 48*   < > 43* 172* 126* 49* 53*  ALT 41   < > 43   < > 46* 86* 245* 171* 168*  ALKPHOS 49   < > 53   < > 60 59 63 68 64  BILITOT 0.9   < > 0.7   < > 0.6 1.0 0.8 1.0 1.0   < > = values in this interval not displayed.   ------------------------------------------------------------------------------------------------------------------ No results for input(s): CHOL, HDL, LDLCALC, TRIG, CHOLHDL, LDLDIRECT in the last 72 hours.  Lab  Results  Component Value Date   HGBA1C 7.0 (H) 02/04/2020   ------------------------------------------------------------------------------------------------------------------ No results for input(s): TSH, T4TOTAL, T3FREE, THYROIDAB in the last 72 hours.  Invalid input(s): FREET3 ------------------------------------------------------------------------------------------------------------------ Recent Labs    03/04/20 1206  03/05/20 0300  FERRITIN 453* 411*    Coagulation profile No results for input(s): INR, PROTIME in the last 168 hours.  Recent Labs    03/04/20 1206 03/05/20 0300  DDIMER 0.68* 0.64*    Cardiac Enzymes No results for input(s): CKMB, TROPONINI, MYOGLOBIN in the last 168 hours.  Invalid input(s): CK ------------------------------------------------------------------------------------------------------------------    Component Value Date/Time   BNP 122.6 (H) 02/27/2020 0536    Micro Results Recent Results (from the past 240 hour(s))  SARS Coronavirus 2 by RT PCR (hospital order, performed in [facility] hospital lab) Nasopharyngeal Nasopharyngeal Swab     Status: Abnormal   Collection Time: 03/08/2020  7:55 AM   Specimen: Nasopharyngeal Swab  Result Value Ref Range Status   SARS Coronavirus 2 POSITIVE (A) NEGATIVE Final    Comment: emailed L. Berdik RN 10:00 03/02/2020 ([email or web address]) (NOTE) SARS-CoV-2 target nucleic acids are DETECTED  SARS-CoV-2 RNA is generally detectable in upper respiratory specimens  during the acute phase of infection.  Positive results are indicative  of the presence of the identified virus, but do not rule out bacterial infection or co-infection with other pathogens not detected by the test.  Clinical correlation with patient history and  other diagnostic information is necessary to determine patient infection status.  The expected result is negative.  Fact Sheet for Patients:   StrictlyIdeas.no   Fact Sheet for Healthcare Providers:   BankingDealers.co.za    This test is not yet approved or cleared by the [place] FDA and  has been authorized for detection and/or diagnosis of SARS-CoV-2 by FDA under an Emergency Use Authorization (EUA).  This EUA will remain in effect (meaning this test can be used) for the duration of  the  COVID-19 declaration under Section 564(b)(1) of the Act, 21 U.S.C. section  360-bbb-3(b)(1), unless the authorization is terminated or revoked sooner.  Performed at [facility], [place] [address] [place], [place] [address]   MRSA PCR Screening     Status: None   Collection Time: 03/03/20  5:57 AM   Specimen: Nasal Mucosa; Nasopharyngeal  Result Value Ref Range Status   MRSA by PCR NEGATIVE NEGATIVE Final    Comment:        The GeneXpert MRSA Assay (FDA approved for NASAL specimens only), is one component of a comprehensive MRSA colonization surveillance program. It is not intended to diagnose MRSA infection nor to guide or monitor treatment for MRSA infections. Performed at [facility], [place] [address] [place], [place] [address]     Radiology Reports US RENAL  Result Date: 02/28/2020 CLINICAL DATA:  Acute renal failure EXAM: RENAL / URINARY TRACT ULTRASOUND COMPLETE COMPARISON:  None. FINDINGS: Right Kidney: Renal measurements: 9.2 x 5.2 x 5.0 cm = volume: 125 mL. Renal cortical echogenicity is diffusely increased suggesting changes of underlying medical renal disease. There is mild diffuse renal cortical atrophy. No hydronephrosis. No intrarenal masses or calcifications. Left Kidney: Renal measurements: 9.1 x 5.2 x 4.5 cm = volume: 106 mL. Renal cortical echogenicity is diffusely increased suggesting changes of underlying medical renal disease. There is mild diffuse renal cortical atrophy. No hydronephrosis. No intrarenal masses or calcifications. 2 cm simple cortical cyst arises exophytically from the lower pole. Bladder: Is decompressed and is not  well visualized. Other: None. IMPRESSION: 1. Increased cortical echogenicity of both kidneys suggesting changes of underlying medical renal disease. 2. Mild diffuse renal cortical atrophy. Electronically Signed   By: Fidela Salisbury MD   On: 02/28/2020 16:11   DG Chest Port 1 View  Result Date: 03/03/2020 CLINICAL DATA:  Shortness of breath.  COVID. EXAM: PORTABLE CHEST 1 VIEW COMPARISON:  02/29/2020.  FINDINGS: Mediastinum hilar structures are unremarkable. Heart size normal. Low lung volumes. Diffuse prominent bilateral interstitial infiltrates again noted. Similar findings noted on prior exam. No prominent pleural effusion. No pneumothorax. Degenerative change thoracic spine. IMPRESSION: Low lung volumes. Diffuse prominent bilateral interstitial filtrates again noted. Similar findings noted on prior exam. Electronically Signed   By: [place]   On: 03/03/2020 07:04   DG Chest Port 1 View  Result Date: 02/29/2020 CLINICAL DATA:  Shortness of breath, COVID-19. EXAM: PORTABLE CHEST 1 VIEW COMPARISON:  February 25, 2020. FINDINGS: Stable cardiomediastinal silhouette. No pneumothorax or pleural effusion is noted. Stable bilateral lung opacities are noted concerning for multifocal pneumonia due to COVID-19. Bony thorax is unremarkable. IMPRESSION: Stable bilateral lung opacities are noted concerning for multifocal pneumonia due to COVID-19. Electronically Signed   By: Marijo Conception M.D.   On: 02/29/2020 08:13   DG Chest Port 1 View  Result Date: 03/09/2020 CLINICAL DATA:  COVID shortness of breath EXAM: PORTABLE CHEST 1 VIEW COMPARISON:  January 14, 2014. FINDINGS: Trachea midline. Cardiomediastinal contours are stable. Interval development of patchy basilar and mid lung predominant opacities in the LEFT and RIGHT chest both interstitial and airspace component. Process more pronounced at the LEFT lung base. Visualized skeletal structures on limited assessment without acute process. No sign of pleural effusion. IMPRESSION: Patchy basilar and mid lung predominant opacities in the LEFT and RIGHT chest both interstitial and airspace component, findings could be seen in the setting of viral or atypical pneumonia, including COVID-19 infection. Electronically Signed   By: Zetta Bills M.D.   On: 03/15/2020 08:12   DG Chest Port 1V same Day  Result Date: 03/04/2020 CLINICAL DATA:  COVID-19 positivity with  hypoxia EXAM: PORTABLE CHEST 1 VIEW COMPARISON:  03/03/2020 FINDINGS: Cardiac shadow is stable. Bibasilar opacities are noted consistent with the given clinical history. The overall appearance is slightly improved when compared with the prior study. No bony abnormality is noted. IMPRESSION: Slight improvement in bilateral airspace disease when compared with the prior study. Electronically Signed   By: Inez Catalina M.D.   On: 03/04/2020 08:09

## 2020-03-05 NOTE — Progress Notes (Signed)
  Speech Language Pathology Treatment: Dysphagia  Patient Details Name: Brandon Santos MRN: [id number] DOB: 1933-12-08 Today's Date: 03/05/2020 Time: 1415-1430 SLP Time Calculation (min) (ACUTE ONLY): 15 min  Assessment / Plan / Recommendation Clinical Impression  ST follow up for therapeutic diet tolerance.  RN reported no obvious issues with intake.  Chart review indicated he has been afebrile and lungs have been variable.  O2 demands are higher this date and he's currently wearing HFNC at 30L and FiO2 of 100% with a 15L non rebreather over top.  O2 levels with in the low to mid 90s throughout PO trials when non rebreather mask would be removed briefly to allow for presentation of PO trials.  When therapist entered the room the patient was requesting to [place].  Oral cavity was noted to be very dry.  Oral care was provided using oral swabs to moisten the oral cavity prior to any PO intake.  He was observed with thin liquids via spoon, straw, and cup and pureed material.  He was noted to have decreased bolus awareness and at times would stop orally manipulating presented material.  This was particularly noted with the pureed material.  He appeared to fatigue quickly.  Swallow trigger was appreciated to palpation and overt s/s of aspiration were not seen.  Recommend downgrade solids to pure for energy conservation and allow for thin liquids.  ST will continue to follow for therapeutic diet tolerance.     HPI HPI: 86 yo male not feeling well for 9-10 days with increasing SOB. Found to be covid positive. Quickly deteriorated and required 15LPM per NRB. PMH DM, HLD, HTN, lumbar fusion. Pt confused yesterday, initiated HFNC and choked on a pill with water. Today, RN reported he is less confused and did well with pill whole in applesauce today. CXR Slight improvement in bilateral airspace disease when compared with prior exam      SLP Plan  Continue with current plan of care       Recommendations   Diet recommendations: Dysphagia 1 (puree);Thin liquid Liquids provided via: Cup;Straw Medication Administration: Whole meds with puree Supervision: Trained caregiver to feed patient Compensations: Minimize environmental distractions;Slow rate;Small sips/bites Postural Changes and/or Swallow Maneuvers: Seated upright 90 degrees;Upright 30-60 min after meal                Oral Care Recommendations: Oral care BID Follow up Recommendations: Other (comment) (TBD) SLP Visit Diagnosis: Dysphagia, unspecified (R13.10) Plan: Continue with current plan of care       Taylor, [place], CCC-SLP Acute Rehab SLP [phone]  Lamar Sprinkles 03/05/2020, 2:59 PM

## 2020-03-05 NOTE — Progress Notes (Signed)
This RN and NT attempted to prone patient. Patient cooperative but could not comfortably get right arm on other side of head. Patient laying on right side with right arm up against head and left leg over right leg. Pillows placed for comfort. Even with HHF O2 turned up to 40L, patient desatted without NRB. NRB re-applied with 30L HHFNC. Patient resting with eyes closed at this time. Will continue to monitor.

## 2020-03-06 LAB — COMPREHENSIVE METABOLIC PANEL
ALT: 99 U/L — ABNORMAL HIGH (ref 0–44)
AST: 22 U/L (ref 15–41)
Albumin: 2.7 g/dL — ABNORMAL LOW (ref 3.5–5.0)
Alkaline Phosphatase: 63 U/L (ref 38–126)
Anion gap: 14 (ref 5–15)
BUN: 72 mg/dL — ABNORMAL HIGH (ref 8–23)
CO2: 25 mmol/L (ref 22–32)
Calcium: 9.2 mg/dL (ref 8.9–10.3)
Chloride: 105 mmol/L (ref 98–111)
Creatinine, Ser: 1.59 mg/dL — ABNORMAL HIGH (ref 0.61–1.24)
GFR calc Af Amer: 46 mL/min — ABNORMAL LOW (ref >60)
GFR calc non Af Amer: 40 mL/min — ABNORMAL LOW (ref >60)
Glucose, Bld: 197 mg/dL — ABNORMAL HIGH (ref 70–99)
Potassium: 4.4 mmol/L (ref 3.5–5.1)
Sodium: 144 mmol/L (ref 135–145)
Total Bilirubin: 1 mg/dL (ref 0.3–1.2)
Total Protein: 5.9 g/dL — ABNORMAL LOW (ref 6.5–8.1)

## 2020-03-06 LAB — D-DIMER, QUANTITATIVE: D-Dimer, Quant: 0.57 ug/mL-FEU — ABNORMAL HIGH (ref 0.00–0.50)

## 2020-03-06 LAB — CBC
HCT: 49.4 % (ref 39.0–52.0)
Hemoglobin: 16.4 g/dL (ref 13.0–17.0)
MCH: 29 pg (ref 26.0–34.0)
MCHC: 33.2 g/dL (ref 30.0–36.0)
MCV: 87.3 fL (ref 80.0–100.0)
Platelets: 335 10*3/uL (ref 150–400)
RBC: 5.66 MIL/uL (ref 4.22–5.81)
RDW: 13.4 % (ref 11.5–15.5)
WBC: 23.4 10*3/uL — ABNORMAL HIGH (ref 4.0–10.5)
nRBC: 0 % (ref 0.0–0.2)

## 2020-03-06 LAB — C-REACTIVE PROTEIN: CRP: 0.5 mg/dL (ref <1.0)

## 2020-03-06 LAB — GLUCOSE, CAPILLARY
Glucose-Capillary: 208 mg/dL — ABNORMAL HIGH (ref 70–99)
Glucose-Capillary: 185 mg/dL — ABNORMAL HIGH (ref 70–99)
Glucose-Capillary: 179 mg/dL — ABNORMAL HIGH (ref 70–99)
Glucose-Capillary: 154 mg/dL — ABNORMAL HIGH (ref 70–99)

## 2020-03-06 LAB — FERRITIN: Ferritin: 380 ng/mL — ABNORMAL HIGH (ref 24–336)

## 2020-03-06 MED ORDER — HYDRALAZINE HCL 25 MG PO TABS
25.0000 mg | ORAL_TABLET | Freq: Three times a day (TID) | ORAL | Status: DC
  Administered 2020-03-06 – 2020-03-07 (×4): 25 mg via ORAL
  Filled 2020-03-06 (×4): qty 1

## 2020-03-06 MED ORDER — POLYETHYLENE GLYCOL 3350 17 G PO PACK
17.0000 g | PACK | Freq: Two times a day (BID) | ORAL | Status: AC
  Administered 2020-03-06 – 2020-03-07 (×4): 17 g via ORAL
  Filled 2020-03-06 (×3): qty 1

## 2020-03-06 NOTE — Progress Notes (Signed)
PROGRESS NOTE                                                                                                                                                                                                             Patient Demographics:    Brandon Santos, is a 86 y.o. male, DOB - 10-Aug-1933, [id number]  Outpatient Primary MD for the patient is Vivi Barrack, MD   Admit date - 02/24/2020   LOS - 10  Chief Complaint  Patient presents with  . Shortness of Breath       Brief Narrative: Patient is a 86 y.o. male with PMHx of HTN, HLD, DM-2-who was not feeling well for the past 9-10 days-presented to the ED with several days of worsening shortness of breath.  He was found to have acute hypoxic respiratory failure secondary to COVID-19 pneumonia and admitted to the hospitalist service.  Post admission-he deteriorated with worsening oxygen requirement-and was placed on 15 L HFNC with NRB.  COVID-19 vaccinated status: Unvaccinated  Significant Events: 9/2>> Admit to [facility] for for severe hypoxemia secondary to COVID-19 pneumonia requiring 15 L of salter high flow  Significant studies: 9/2>>Chest x-ray: Patchy bilateral lung infiltrates 9/5>> renal ultrasound: Increased cortical echogenicity of both kidneys suggesting underlying medical renal disease, no hydronephrosis. 9/6>> chest x-ray: Stable bilateral lung opacities-concerning for multifocal pneumonia 9/9>> diffuse bilateral interstitial changes 9/9>> worsening hypoxemia-transition to heated high flow with NRB 9/10>> chest x-ray: Slight improvement in bilateral airspace disease when compared with the prior study.  COVID-19 medications: Steroids: 9/2>> Remdesivir: 9/2>>9/6 Actemra: 9/3 x 1  Antibiotics: None  Microbiology data: None  Procedures: None  Consults: None  DVT prophylaxis: Prophylactic Lovenox-at twice daily dosing.    Subjective:   He  remains with intermittent confusion, he does report some constipation as well, he remains with significant oxygen requirement requiring both heated high flow and NRB.  Marland Kitchen    Assessment  & Plan :   Acute Hypoxic Resp Failure due to Covid 19 Viral pneumonia:  - Continues to have severe hypoxemia-remains on heated high flow and NRB.   - No signs of volume overload (neg balance of 3.2 L so far).   -Received Actemra 9/3 -Treated with IV steroids -Treated with IV remdesivir -Discussed with patient importance about incentive spirometry and flutter valve. -Respiratory status remains very tenuous,  high risk for decompensation.  Fever: afebrile O2 requirements:  SpO2: 91 % O2 Flow Rate (L/min): 30 L/min FiO2 (%): 90 % (and 100% NRB)   COVID-19 Labs: Recent Labs    03/04/20 1206 03/05/20 0300  DDIMER 0.68* 0.64*  FERRITIN 453* 411*  CRP <0.5 0.6       Component Value Date/Time   BNP 122.6 (H) 02/27/2020 0536    No results for input(s): PROCALCITON in the last 168 hours.  Lab Results  Component Value Date   SARSCOV2NAA POSITIVE (A) 03/15/2020     Prone/Incentive Spirometry: encouraged  incentive spirometry use 3-4/hour, encourage prone position-2-3 hours at a time for up to 15 hours a day.  AKI:  - Likely hemodynamically mediated-suspect has some amount of underlying CKD.  UA without proteinuria, renal ultrasound without hydronephrosis-but with some suggestion of medical renal disease.  Monitor closely as he is on as needed Lasix.  Transaminitis: Appears to be mild-stable for close monitoring-abdominal exam is benign.  Acute metabolic encephalopathy: Secondary to hypoxemia/acute illness-relatively awake and alert this morning.  However he does get intermittently confused.  Continue to maintain delirium precautions.  He has a nonfocal exam.  ABG last night did not show any hypercarbia.  Continue to monitor closely-do not think he requires any imaging at this  point.  Choking/dysphagia: Occurred on 9/9-kept n.p.o.-SLP evaluation performed-on dysphagia 2 diet.  Per nursing staff-no obvious coughing spells or overt aspiration when eating.  DM-2 (A1c 7.0 on 8/12) with uncontrolled hyperglycemia due to steroids: CBGs stable-continue 7 units of Levemir twice daily and SSI.  Follow and adjust accordingly.     Recent Labs    03/05/20 2108 03/06/20 0744 03/06/20 1212  GLUCAP 166* 208* 185*   HTN: Blood pressure on the higher side, will add low-dose hydralazine.  HLD: Continue statin  Deconditioning/debility: Secondary to acute illness-PT following-recommendations are for SNF.  Obesity: Estimated body mass index is 30.25 kg/m as calculated from the following:   Height as of this encounter: 5\' 6"  (1.676 m).   Weight as of this encounter: 85 kg.   GI prophylaxis: PPI  ABG:    Component Value Date/Time   PHART 7.500 (H) 03/04/2020 2220   PCO2ART 35.1 03/04/2020 2220   PO2ART 53.8 (L) 03/04/2020 2220   HCO3 27.1 03/04/2020 2220   O2SAT 87.1 03/04/2020 2220    Vent Settings: N/A  Condition - Extremely Guarded-very tenuous with risk for further deterioration  Family Communication  :  Son Milta Deiters [phone] 9/12  Code Status :  Full Code  Diet :  Diet Order            DIET - DYS 1 Room service appropriate? Yes; Fluid consistency: Thin  Diet effective now                  Disposition Plan  :   Status is: Inpatient  Remains inpatient appropriate because:Inpatient level of care appropriate due to severity of illness  Dispo: The patient is from: Home              Anticipated d/c is to: TBD              Anticipated d/c date is: > 3 days              Patient currently is not medically stable to d/c.   Barriers to discharge: Hypoxia requiring O2 supplementation  Antimicorbials  :    Anti-infectives (From admission, onward)   Start  Dose/Rate Route Frequency Ordered Stop   02/29/20 1045  remdesivir 100 mg in sodium  chloride 0.9 % 100 mL IVPB        100 mg 200 mL/hr over 30 Minutes Intravenous Daily 02/29/20 1039 02/29/20 1135   02/26/20 1000  remdesivir 100 mg in sodium chloride 0.9 % 100 mL IVPB       "Followed by" Linked Group Details   100 mg 200 mL/hr over 30 Minutes Intravenous Daily 03/22/2020 1014 02/29/20 1140   03/13/2020 1100  remdesivir 200 mg in sodium chloride 0.9% 250 mL IVPB       "Followed by" Linked Group Details   200 mg 580 mL/hr over 30 Minutes Intravenous Once 03/14/2020 1014 02/28/2020 1327      Inpatient Medications  Scheduled Meds: . (feeding supplement) PROSource Plus  30 mL Oral Daily  . amLODipine  10 mg Oral Daily  . chlorhexidine  15 mL Mouth Rinse BID  . enoxaparin (LOVENOX) injection  40 mg Subcutaneous Q12H  . feeding supplement (ENSURE ENLIVE)  237 mL Oral BID BM  . insulin aspart  0-20 Units Subcutaneous TID WC  . insulin aspart  0-5 Units Subcutaneous QHS  . insulin detemir  7 Units Subcutaneous BID  . latanoprost  1 drop Both Eyes QHS  . mouth rinse  15 mL Mouth Rinse BID  . methylPREDNISolone (SOLU-MEDROL) injection  40 mg Intravenous Q12H  . metoprolol tartrate  5 mg Intravenous Q6H  . pantoprazole  40 mg Oral Q1200  . simvastatin  20 mg Oral QPM  . sodium chloride flush  3 mL Intravenous Q12H  . timolol  1 drop Both Eyes Daily   Continuous Infusions: . sodium chloride     PRN Meds:.sodium chloride, acetaminophen, albuterol, bisacodyl, chlorpheniramine-HYDROcodone, guaiFENesin-dextromethorphan, hydrALAZINE, melatonin, ondansetron **OR** ondansetron (ZOFRAN) IV, oxyCODONE, polyethylene glycol, Resource ThickenUp Clear, sodium chloride, sodium chloride flush, sodium phosphate   Time Spent in minutes  35   See all Orders from today for further details   Phillips Climes M.D on 03/06/2020 at 1:24 PM  To page go to [email or web address] - use universal password  [facility] -  Office  [phone]    Objective:   Vitals:   03/06/20 0400 03/06/20 0740  03/06/20 0948 03/06/20 1210  BP: (!) 161/73 (!) 159/74  (!) 156/77  Pulse: 76 78 78 75  Resp: 17 19 20 20   Temp: (!) 97 F (36.1 C) (!) 97.5 F (36.4 C)  (!) 97.2 F (36.2 C)  TempSrc: Oral   Axillary  SpO2: 97% 93% 95% 91%  Weight:      Height:        Wt Readings from Last 3 Encounters:  02/26/20 85 kg  02/04/20 84.5 kg  08/10/19 85.4 kg     Intake/Output Summary (Last 24 hours) at 03/06/2020 1324 Last data filed at 03/05/2020 2100 Gross per 24 hour  Intake 120 ml  Output --  Net 120 ml     Physical Exam  Awake Alert, extremely weak, deconditioned  Symmetrical Chest wall movement, fair air entry bilaterally, clear to auscultation, mildly tachypneic. RRR,No Gallops,Rubs or new Murmurs, No Parasternal Heave +ve B.Sounds, Abd Soft, No tenderness, No rebound - guarding or rigidity. No Cyanosis, Clubbing or edema, No new Rash or bruise     Data Review:    CBC Recent Labs  Lab 02/29/20 0255 02/29/20 0255 03/01/20 0307 03/02/20 1151 03/03/20 0621 03/04/20 1206 03/05/20 0300  WBC 17.2*   < > 20.1* 20.7* 19.7* 21.0*  20.2*  HGB 13.5   < > 13.8 15.0 15.0 16.6 15.3  HCT 39.5   < > 40.6 43.5 43.0 49.1 45.0  PLT 341   < > 364 384 389 364 368  MCV 85.1   < > 85.5 86.1 85.1 87.8 86.9  MCH 29.1   < > 29.1 29.7 29.7 29.7 29.5  MCHC 34.2   < > 34.0 34.5 34.9 33.8 34.0  RDW 13.3   < > 13.3 13.2 13.1 13.4 13.3  LYMPHSABS 0.9  --  1.1  --   --   --   --   MONOABS 0.7  --  0.8  --   --   --   --   EOSABS 0.0  --  0.0  --   --   --   --   BASOSABS 0.0  --  0.0  --   --   --   --    < > = values in this interval not displayed.    Chemistries  Recent Labs  Lab 02/29/20 0255 02/29/20 0255 03/01/20 0307 03/02/20 1151 03/03/20 0621 03/04/20 1206 03/05/20 0300  NA 141   < > 142 137 139 142 142  K 3.9   < > 4.2 4.3 4.4 4.0 4.4  CL 108   < > 106 102 105 100 102  CO2 20*   < > 23 21* 21* 27 27  GLUCOSE 88   < > 109* 331* 143* 131* 167*  BUN 49*   < > 43* 44* 43* 51* 59*   CREATININE 1.47*   < > 1.32* 1.36* 1.26* 1.42* 1.53*  CALCIUM 8.6*   < > 8.8* 8.5* 8.8* 8.9 8.9  MG 2.1  --  2.1  --   --   --   --   AST 48*   < > 43* 172* 126* 49* 53*  ALT 43   < > 46* 86* 245* 171* 168*  ALKPHOS 53   < > 60 59 63 68 64  BILITOT 0.7   < > 0.6 1.0 0.8 1.0 1.0   < > = values in this interval not displayed.   ------------------------------------------------------------------------------------------------------------------ No results for input(s): CHOL, HDL, LDLCALC, TRIG, CHOLHDL, LDLDIRECT in the last 72 hours.  Lab Results  Component Value Date   HGBA1C 7.0 (H) 02/04/2020   ------------------------------------------------------------------------------------------------------------------ No results for input(s): TSH, T4TOTAL, T3FREE, THYROIDAB in the last 72 hours.  Invalid input(s): FREET3 ------------------------------------------------------------------------------------------------------------------ Recent Labs    03/04/20 1206 03/05/20 0300  FERRITIN 453* 411*    Coagulation profile No results for input(s): INR, PROTIME in the last 168 hours.  Recent Labs    03/04/20 1206 03/05/20 0300  DDIMER 0.68* 0.64*    Cardiac Enzymes No results for input(s): CKMB, TROPONINI, MYOGLOBIN in the last 168 hours.  Invalid input(s): CK ------------------------------------------------------------------------------------------------------------------    Component Value Date/Time   BNP 122.6 (H) 02/27/2020 0536    Micro Results Recent Results (from the past 240 hour(s))  MRSA PCR Screening     Status: None   Collection Time: 03/03/20  5:57 AM   Specimen: Nasal Mucosa; Nasopharyngeal  Result Value Ref Range Status   MRSA by PCR NEGATIVE NEGATIVE Final    Comment:        The GeneXpert MRSA Assay (FDA approved for NASAL specimens only), is one component of a comprehensive MRSA colonization surveillance program. It is not intended to diagnose  MRSA infection nor to guide or monitor treatment for MRSA infections. Performed  at [facility], [place] [address] [place], [place] [address]     Radiology Reports US RENAL  Result Date: 02/28/2020 CLINICAL DATA:  Acute renal failure EXAM: RENAL / URINARY TRACT ULTRASOUND COMPLETE COMPARISON:  None. FINDINGS: Right Kidney: Renal measurements: 9.2 x 5.2 x 5.0 cm = volume: 125 mL. Renal cortical echogenicity is diffusely increased suggesting changes of underlying medical renal disease. There is mild diffuse renal cortical atrophy. No hydronephrosis. No intrarenal masses or calcifications. Left Kidney: Renal measurements: 9.1 x 5.2 x 4.5 cm = volume: 106 mL. Renal cortical echogenicity is diffusely increased suggesting changes of underlying medical renal disease. There is mild diffuse renal cortical atrophy. No hydronephrosis. No intrarenal masses or calcifications. 2 cm simple cortical cyst arises exophytically from the lower pole. Bladder: Is decompressed and is not well visualized. Other: None. IMPRESSION: 1. Increased cortical echogenicity of both kidneys suggesting changes of underlying medical renal disease. 2. Mild diffuse renal cortical atrophy. Electronically Signed   By: Fidela Salisbury MD   On: 02/28/2020 16:11   DG Chest Port 1 View  Result Date: 03/03/2020 CLINICAL DATA:  Shortness of breath.  COVID. EXAM: PORTABLE CHEST 1 VIEW COMPARISON:  02/29/2020. FINDINGS: Mediastinum hilar structures are unremarkable. Heart size normal. Low lung volumes. Diffuse prominent bilateral interstitial infiltrates again noted. Similar findings noted on prior exam. No prominent pleural effusion. No pneumothorax. Degenerative change thoracic spine. IMPRESSION: Low lung volumes. Diffuse prominent bilateral interstitial filtrates again noted. Similar findings noted on prior exam. Electronically Signed   By: [place]   On: 03/03/2020 07:04   DG Chest Port 1 View  Result Date: 02/29/2020 CLINICAL DATA:   Shortness of breath, COVID-19. EXAM: PORTABLE CHEST 1 VIEW COMPARISON:  February 25, 2020. FINDINGS: Stable cardiomediastinal silhouette. No pneumothorax or pleural effusion is noted. Stable bilateral lung opacities are noted concerning for multifocal pneumonia due to COVID-19. Bony thorax is unremarkable. IMPRESSION: Stable bilateral lung opacities are noted concerning for multifocal pneumonia due to COVID-19. Electronically Signed   By: Marijo Conception M.D.   On: 02/29/2020 08:13   DG Chest Port 1 View  Result Date: 03/13/2020 CLINICAL DATA:  COVID shortness of breath EXAM: PORTABLE CHEST 1 VIEW COMPARISON:  January 14, 2014. FINDINGS: Trachea midline. Cardiomediastinal contours are stable. Interval development of patchy basilar and mid lung predominant opacities in the LEFT and RIGHT chest both interstitial and airspace component. Process more pronounced at the LEFT lung base. Visualized skeletal structures on limited assessment without acute process. No sign of pleural effusion. IMPRESSION: Patchy basilar and mid lung predominant opacities in the LEFT and RIGHT chest both interstitial and airspace component, findings could be seen in the setting of viral or atypical pneumonia, including COVID-19 infection. Electronically Signed   By: Zetta Bills M.D.   On: 03/10/2020 08:12   DG Chest Port 1V same Day  Result Date: 03/04/2020 CLINICAL DATA:  COVID-19 positivity with hypoxia EXAM: PORTABLE CHEST 1 VIEW COMPARISON:  03/03/2020 FINDINGS: Cardiac shadow is stable. Bibasilar opacities are noted consistent with the given clinical history. The overall appearance is slightly improved when compared with the prior study. No bony abnormality is noted. IMPRESSION: Slight improvement in bilateral airspace disease when compared with the prior study. Electronically Signed   By: Inez Catalina M.D.   On: 03/04/2020 08:09

## 2020-03-06 NOTE — Progress Notes (Signed)
Scheduled meds given at this time, see MAR. Linens changed and bed bath given. Pt refused to take any PO nutrition at this time, pt verbalized that he was very aggravated and did not want to be bothered, when asked what I could do to help pt with his frustration he stated "nobody can do anything to help me".  Also spoke to pts son via phone at this time, family updated, and allowed to talk to pt via facetime.

## 2020-03-06 NOTE — Progress Notes (Signed)
Pt refuses to get out of the bed to ambulate. Pt refuses any kind of movement

## 2020-03-07 LAB — COMPREHENSIVE METABOLIC PANEL
ALT: 84 U/L — ABNORMAL HIGH (ref 0–44)
AST: 21 U/L (ref 15–41)
Albumin: 2.8 g/dL — ABNORMAL LOW (ref 3.5–5.0)
Alkaline Phosphatase: 68 U/L (ref 38–126)
Anion gap: 12 (ref 5–15)
BUN: 77 mg/dL — ABNORMAL HIGH (ref 8–23)
CO2: 25 mmol/L (ref 22–32)
Calcium: 9.3 mg/dL (ref 8.9–10.3)
Chloride: 108 mmol/L (ref 98–111)
Creatinine, Ser: 1.53 mg/dL — ABNORMAL HIGH (ref 0.61–1.24)
GFR calc Af Amer: 48 mL/min — ABNORMAL LOW (ref >60)
GFR calc non Af Amer: 41 mL/min — ABNORMAL LOW (ref >60)
Glucose, Bld: 177 mg/dL — ABNORMAL HIGH (ref 70–99)
Potassium: 4.5 mmol/L (ref 3.5–5.1)
Sodium: 145 mmol/L (ref 135–145)
Total Bilirubin: 1.1 mg/dL (ref 0.3–1.2)
Total Protein: 6.2 g/dL — ABNORMAL LOW (ref 6.5–8.1)

## 2020-03-07 LAB — GLUCOSE, CAPILLARY
Glucose-Capillary: 196 mg/dL — ABNORMAL HIGH (ref 70–99)
Glucose-Capillary: 216 mg/dL — ABNORMAL HIGH (ref 70–99)
Glucose-Capillary: 208 mg/dL — ABNORMAL HIGH (ref 70–99)
Glucose-Capillary: 285 mg/dL — ABNORMAL HIGH (ref 70–99)

## 2020-03-07 LAB — CBC
HCT: 51.4 % (ref 39.0–52.0)
Hemoglobin: 17 g/dL (ref 13.0–17.0)
MCH: 29.5 pg (ref 26.0–34.0)
MCHC: 33.1 g/dL (ref 30.0–36.0)
MCV: 89.2 fL (ref 80.0–100.0)
Platelets: 330 10*3/uL (ref 150–400)
RBC: 5.76 MIL/uL (ref 4.22–5.81)
RDW: 13.8 % (ref 11.5–15.5)
WBC: 24.2 10*3/uL — ABNORMAL HIGH (ref 4.0–10.5)
nRBC: 0 % (ref 0.0–0.2)

## 2020-03-07 LAB — FERRITIN: Ferritin: 346 ng/mL — ABNORMAL HIGH (ref 24–336)

## 2020-03-07 LAB — C-REACTIVE PROTEIN: CRP: <0.5 mg/dL (ref <1.0)

## 2020-03-07 LAB — D-DIMER, QUANTITATIVE: D-Dimer, Quant: 0.44 ug/mL-FEU (ref 0.00–0.50)

## 2020-03-07 MED ORDER — GLUCERNA SHAKE PO LIQD
237.0000 mL | Freq: Four times a day (QID) | ORAL | Status: DC
  Administered 2020-03-07 – 2020-03-08 (×3): 237 mL via ORAL

## 2020-03-07 MED ORDER — GLUCERNA SHAKE PO LIQD
237.0000 mL | Freq: Three times a day (TID) | ORAL | Status: DC
  Administered 2020-03-07 (×2): 237 mL via ORAL

## 2020-03-07 MED ORDER — ESCITALOPRAM OXALATE 10 MG PO TABS
5.0000 mg | ORAL_TABLET | Freq: Every day | ORAL | Status: DC
  Administered 2020-03-07 – 2020-03-08 (×2): 5 mg via ORAL
  Filled 2020-03-07 (×2): qty 1

## 2020-03-07 MED ORDER — HYDRALAZINE HCL 25 MG PO TABS
25.0000 mg | ORAL_TABLET | Freq: Four times a day (QID) | ORAL | Status: DC
  Administered 2020-03-07 – 2020-03-08 (×3): 25 mg via ORAL
  Filled 2020-03-07 (×4): qty 1

## 2020-03-07 MED ORDER — PROSOURCE PLUS PO LIQD
30.0000 mL | Freq: Three times a day (TID) | ORAL | Status: DC
  Administered 2020-03-07 – 2020-03-09 (×5): 30 mL via ORAL
  Filled 2020-03-07 (×7): qty 30

## 2020-03-07 NOTE — Progress Notes (Signed)
With assist from NT, pt placed in recliner chair. Pt very agitated from having to transfer positions. Encouraged pt throughout shift thus far on use of incentive spirometer and flutter valve but pt will not comply with activities. MD and son Milta Deiters aware. Milta Deiters on facetime with pt to encourage pt to participate in activities but pt became upset with Milta Deiters and verbally stopped conversing with him.

## 2020-03-07 NOTE — Progress Notes (Signed)
Nutrition Follow-up  DOCUMENTATION CODES:   Not applicable  INTERVENTION:  Provide Glucerna Shake po QID, each supplement provides 220 kcal and 10 grams of protein.  Provide 30 ml Prosource plus po TID, each supplement provides 100 kcal and 15 grams of protein.   Provide Magic cup TID with meals, each supplement provides 290 kcal and 9 grams of protein  Encourage adequate PO intake.   NUTRITION DIAGNOSIS:   Increased nutrient needs related to acute illness (COVID) as evidenced by estimated needs; ongoing  GOAL:   Patient will meet greater than or equal to 90% of their needs; progressing  MONITOR:   PO intake, Supplement acceptance, Skin, Weight trends, Labs, I & O's  REASON FOR ASSESSMENT:   Consult Assessment of nutrition requirement/status  ASSESSMENT:   86 y.o. male with PMHx of HTN, HLD, DM-2 presents with worsening shortness of breath. Pt found to have acute hypoxic respiratory failure secondary to COVID-19 pneumonia.  Pt is currently on 20 L HFNC via NRB. Pt currrently on a dysphagia 1 diet with thin liquids. Pt has been tolerating his PO diet. Meal completion has been poor at 5-20%. Pt with poor appetite. Pt currently has Glucerna shake and Prostat ordered and has been consuming them. RD to increase nutritional supplementation to aid in caloric and protein needs. Magic cup to be sent on all meal trays.   Labs and medications reviewed. RD working remotely.  Diet Order:   Diet Order            DIET - DYS 1 Room service appropriate? Yes; Fluid consistency: Thin  Diet effective now                 EDUCATION NEEDS:   Not appropriate for education at this time  Skin:  Skin Assessment: Reviewed RN Assessment  Last BM:  Unknown  Height:   Ht Readings from Last 1 Encounters:  02/26/20 5\' 6"  (1.676 m)    Weight:   Wt Readings from Last 1 Encounters:  02/26/20 85 kg   BMI:  Body mass index is 30.25 kg/m.  Estimated Nutritional Needs:   Kcal:   1850-2050  Protein:  90-105 grams  Fluid:  >/= 1.8 L/day  Corrin Parker, MS, RD, LDN RD pager number/after hours weekend pager number on Amion.

## 2020-03-07 NOTE — Progress Notes (Signed)
PROGRESS NOTE                                                                                                                                                                                                             Patient Demographics:    Brandon Santos, is a 86 y.o. male, DOB - [birth year], YKD:[id number]  Outpatient Primary MD for the patient is Vivi Barrack, MD   Admit date - 03/24/2020   LOS - 19  Chief Complaint  Patient presents with  . Shortness of Breath       Brief Narrative: Patient is a 86 y.o. male with PMHx of HTN, HLD, DM-2-who was not feeling well for the past 9-10 days-presented to the ED with several days of worsening shortness of breath.  He was found to have acute hypoxic respiratory failure secondary to COVID-19 pneumonia and admitted to the hospitalist service.  Post admission-he deteriorated with worsening oxygen requirement-and was placed on 15 L HFNC with NRB.  COVID-19 vaccinated status: Unvaccinated  Significant Events: 9/2>> Admit to [facility] for for severe hypoxemia secondary to COVID-19 pneumonia requiring 15 L of salter high flow  Significant studies: 9/2>>Chest x-ray: Patchy bilateral lung infiltrates 9/5>> renal ultrasound: Increased cortical echogenicity of both kidneys suggesting underlying medical renal disease, no hydronephrosis. 9/6>> chest x-ray: Stable bilateral lung opacities-concerning for multifocal pneumonia 9/9>> diffuse bilateral interstitial changes 9/9>> worsening hypoxemia-transition to heated high flow with NRB 9/10>> chest x-ray: Slight improvement in bilateral airspace disease when compared with the prior study.  COVID-19 medications: Steroids: 9/2>> Remdesivir: 9/2>>9/6 Actemra: 9/3 x 1  Antibiotics: None  Microbiology data: None  Procedures: None  Consults: None  DVT prophylaxis: Prophylactic Lovenox-at twice daily dosing.    Subjective:   He  remains with did not comply with staff instructions to get out of bed to chair or to his incentive spirometer yesterday .   Assessment  & Plan :   Acute Hypoxic Resp Failure due to Covid 19 Viral pneumonia:  -Respiratory status remains very tenuous, continues to have severe hypoxemia-remains on 20 L heated high flow and NRB.   - No signs of volume overload (neg balance of 3.2 L so far).   -Received Actemra 9/3 -Treated with IV steroids -Treated with IV remdesivir -I have discussed with him again importance about incentive spirometry, flutter valve and out  of bed to chair, as he did not comply with these recommendations yesterday. -Respiratory status remains very tenuous, high risk for decompensation.  Fever: afebrile O2 requirements:  SpO2: 91 % O2 Flow Rate (L/min): 20 L/min (+ 15L NRB) FiO2 (%): 60 %   COVID-19 Labs: Recent Labs    03/05/20 0300 03/06/20 1746 03/07/20 0500  DDIMER 0.64* 0.57* 0.44  FERRITIN 411* 380* 346*  CRP 0.6 0.5 <0.5       Component Value Date/Time   BNP 122.6 (H) 02/27/2020 0536    No results for input(s): PROCALCITON in the last 168 hours.  Lab Results  Component Value Date   SARSCOV2NAA POSITIVE (A) 03/21/2020     Prone/Incentive Spirometry: encouraged  incentive spirometry use 3-4/hour, encourage prone position-2-3 hours at a time for up to 15 hours a day.  AKI:  - Likely hemodynamically mediated-suspect has some amount of underlying CKD.  -UA without proteinuria, but renal ultrasound without hydronephrosis, but some suggestion of medical renal disease. -On Lasix as needed. Transaminitis: Appears to be mild-stable for close monitoring-abdominal exam is benign.  Acute metabolic encephalopathy:  - Secondary to hypoxemia/acute illness-relatively awake and alert this morning.  However he does get intermittently confused.  Continue to maintain delirium precautions.  He has a nonfocal exam.  -As well some hospital related  delirium  Choking/dysphagia: Occurred on 9/9-kept n.p.o.-SLP evaluation performed-on dysphagia 2 diet.  Per nursing staff-no obvious coughing spells or overt aspiration when eating.  DM-2 (A1c 7.0 on 8/12) with uncontrolled hyperglycemia due to steroids: CBGs stable-continue 7 units of Levemir twice daily and SSI.  Follow and adjust accordingly.     Recent Labs    03/06/20 2031 03/07/20 0807 03/07/20 1157  GLUCAP 154* 196* 216*   HTN: Blood pressure on the higher side, will increase his hydralazine to QID.  HLD: Continue statin  Deconditioning/debility: Secondary to acute illness-PT following-recommendations are for SNF.  Obesity: Estimated body mass index is 30.25 kg/m as calculated from the following:   Height as of this encounter: 5\' 6"  (1.676 m).   Weight as of this encounter: 85 kg.   Will start on low dose Lexapro   GI prophylaxis: PPI  ABG:    Component Value Date/Time   PHART 7.500 (H) 03/04/2020 2220   PCO2ART 35.1 03/04/2020 2220   PO2ART 53.8 (L) 03/04/2020 2220   HCO3 27.1 03/04/2020 2220   O2SAT 87.1 03/04/2020 2220    Vent Settings: N/A  Condition - Extremely Guarded-very tenuous with risk for further deterioration clinical  Family Communication  :  Son Milta Deiters [phone])-on 9/13.  Code Status :  Full Code  Diet :  Diet Order            DIET - DYS 1 Room service appropriate? Yes; Fluid consistency: Thin  Diet effective now                  Disposition Plan  :   Status is: Inpatient  Remains inpatient appropriate because:Inpatient level of care appropriate due to severity of illness  Dispo: The patient is from: Home              Anticipated d/c is to: TBD              Anticipated d/c date is: > 3 days              Patient currently is not medically stable to d/c.   Barriers to discharge: Hypoxia requiring O2 supplementation  Antimicorbials  :  Anti-infectives (From admission, onward)   Start     Dose/Rate Route Frequency Ordered  Stop   02/29/20 1045  remdesivir 100 mg in sodium chloride 0.9 % 100 mL IVPB        100 mg 200 mL/hr over 30 Minutes Intravenous Daily 02/29/20 1039 02/29/20 1135   02/26/20 1000  remdesivir 100 mg in sodium chloride 0.9 % 100 mL IVPB       "Followed by" Linked Group Details   100 mg 200 mL/hr over 30 Minutes Intravenous Daily 03/07/2020 1014 02/29/20 1140   02/24/2020 1100  remdesivir 200 mg in sodium chloride 0.9% 250 mL IVPB       "Followed by" Linked Group Details   200 mg 580 mL/hr over 30 Minutes Intravenous Once 02/26/2020 1014 02/28/2020 1327      Inpatient Medications  Scheduled Meds: . (feeding supplement) PROSource Plus  30 mL Oral Daily  . amLODipine  10 mg Oral Daily  . chlorhexidine  15 mL Mouth Rinse BID  . enoxaparin (LOVENOX) injection  40 mg Subcutaneous Q12H  . feeding supplement (GLUCERNA SHAKE)  237 mL Oral TID BM  . hydrALAZINE  25 mg Oral Q8H  . insulin aspart  0-20 Units Subcutaneous TID WC  . insulin aspart  0-5 Units Subcutaneous QHS  . insulin detemir  7 Units Subcutaneous BID  . latanoprost  1 drop Both Eyes QHS  . mouth rinse  15 mL Mouth Rinse BID  . methylPREDNISolone (SOLU-MEDROL) injection  40 mg Intravenous Q12H  . metoprolol tartrate  5 mg Intravenous Q6H  . pantoprazole  40 mg Oral Q1200  . polyethylene glycol  17 g Oral BID  . simvastatin  20 mg Oral QPM  . sodium chloride flush  3 mL Intravenous Q12H  . timolol  1 drop Both Eyes Daily   Continuous Infusions: . sodium chloride     PRN Meds:.sodium chloride, acetaminophen, albuterol, bisacodyl, chlorpheniramine-HYDROcodone, guaiFENesin-dextromethorphan, hydrALAZINE, melatonin, ondansetron **OR** ondansetron (ZOFRAN) IV, oxyCODONE, polyethylene glycol, Resource ThickenUp Clear, sodium chloride, sodium chloride flush, sodium phosphate   Time Spent in minutes  35   See all Orders from today for further details   Phillips Climes M.D on 03/07/2020 at 2:16 PM  To page go to [email or web address] - use  universal password  [facility] -  Office  [phone]    Objective:   Vitals:   03/07/20 0400 03/07/20 0803 03/07/20 0808 03/07/20 1200  BP: (!) 144/66  (!) 148/75 (!) 167/76  Pulse: 80  78 80  Resp: (!) 23  20 (!) 24  Temp: 97.6 F (36.4 C)  97.6 F (36.4 C) 98 F (36.7 C)  TempSrc: Axillary  Axillary Oral  SpO2: 93% 92% 95% 91%  Weight:      Height:        Wt Readings from Last 3 Encounters:  02/26/20 85 kg  02/04/20 84.5 kg  08/10/19 85.4 kg     Intake/Output Summary (Last 24 hours) at 03/07/2020 1416 Last data filed at 03/07/2020 1300 Gross per 24 hour  Intake 510 ml  Output 300 ml  Net 210 ml     Physical Exam  Awake Alert, mildly confused today, extremely frail and weak Symmetrical Chest wall movement, mildly tachypneic, diminished air entry at the bases RRR,No Gallops,Rubs or new Murmurs, No Parasternal Heave +ve B.Sounds, Abd Soft, No tenderness, No rebound - guarding or rigidity. No Cyanosis, Clubbing or edema, No new Rash or bruise      Data Review:  CBC Recent Labs  Lab 03/01/20 0307 03/02/20 1151 03/03/20 0621 03/04/20 1206 03/05/20 0300 03/06/20 1746 03/07/20 0500  WBC 20.1*   < > 19.7* 21.0* 20.2* 23.4* 24.2*  HGB 13.8   < > 15.0 16.6 15.3 16.4 17.0  HCT 40.6   < > 43.0 49.1 45.0 49.4 51.4  PLT 364   < > 389 364 368 335 330  MCV 85.5   < > 85.1 87.8 86.9 87.3 89.2  MCH 29.1   < > 29.7 29.7 29.5 29.0 29.5  MCHC 34.0   < > 34.9 33.8 34.0 33.2 33.1  RDW 13.3   < > 13.1 13.4 13.3 13.4 13.8  LYMPHSABS 1.1  --   --   --   --   --   --   MONOABS 0.8  --   --   --   --   --   --   EOSABS 0.0  --   --   --   --   --   --   BASOSABS 0.0  --   --   --   --   --   --    < > = values in this interval not displayed.    Chemistries  Recent Labs  Lab 03/01/20 0307 03/02/20 1151 03/03/20 0621 03/04/20 1206 03/05/20 0300 03/06/20 1746 03/07/20 0500  NA 142   < > 139 142 142 144 145  K 4.2   < > 4.4 4.0 4.4 4.4 4.5  CL 106   <  > 105 100 102 105 108  CO2 23   < > 21* 27 27 25 25   GLUCOSE 109*   < > 143* 131* 167* 197* 177*  BUN 43*   < > 43* 51* 59* 72* 77*  CREATININE 1.32*   < > 1.26* 1.42* 1.53* 1.59* 1.53*  CALCIUM 8.8*   < > 8.8* 8.9 8.9 9.2 9.3  MG 2.1  --   --   --   --   --   --   AST 43*   < > 126* 49* 53* 22 21  ALT 46*   < > 245* 171* 168* 99* 84*  ALKPHOS 60   < > 63 68 64 63 68  BILITOT 0.6   < > 0.8 1.0 1.0 1.0 1.1   < > = values in this interval not displayed.   ------------------------------------------------------------------------------------------------------------------ No results for input(s): CHOL, HDL, LDLCALC, TRIG, CHOLHDL, LDLDIRECT in the last 72 hours.  Lab Results  Component Value Date   HGBA1C 7.0 (H) 02/04/2020   ------------------------------------------------------------------------------------------------------------------ No results for input(s): TSH, T4TOTAL, T3FREE, THYROIDAB in the last 72 hours.  Invalid input(s): FREET3 ------------------------------------------------------------------------------------------------------------------ Recent Labs    03/06/20 1746 03/07/20 0500  FERRITIN 380* 346*    Coagulation profile No results for input(s): INR, PROTIME in the last 168 hours.  Recent Labs    03/06/20 1746 03/07/20 0500  DDIMER 0.57* 0.44    Cardiac Enzymes No results for input(s): CKMB, TROPONINI, MYOGLOBIN in the last 168 hours.  Invalid input(s): CK ------------------------------------------------------------------------------------------------------------------    Component Value Date/Time   BNP 122.6 (H) 02/27/2020 0536    Micro Results Recent Results (from the past 240 hour(s))  MRSA PCR Screening     Status: None   Collection Time: 03/03/20  5:57 AM   Specimen: Nasal Mucosa; Nasopharyngeal  Result Value Ref Range Status   MRSA by PCR NEGATIVE NEGATIVE Final    Comment:  The GeneXpert MRSA Assay (FDA approved for NASAL  specimens only), is one component of a comprehensive MRSA colonization surveillance program. It is not intended to diagnose MRSA infection nor to guide or monitor treatment for MRSA infections. Performed at [facility], [place] [address], [place], [place] [address]     Radiology Reports US RENAL  Result Date: 02/28/2020 CLINICAL DATA:  Acute renal failure EXAM: RENAL / URINARY TRACT ULTRASOUND COMPLETE COMPARISON:  None. FINDINGS: Right Kidney: Renal measurements: 9.2 x 5.2 x 5.0 cm = volume: 125 mL. Renal cortical echogenicity is diffusely increased suggesting changes of underlying medical renal disease. There is mild diffuse renal cortical atrophy. No hydronephrosis. No intrarenal masses or calcifications. Left Kidney: Renal measurements: 9.1 x 5.2 x 4.5 cm = volume: 106 mL. Renal cortical echogenicity is diffusely increased suggesting changes of underlying medical renal disease. There is mild diffuse renal cortical atrophy. No hydronephrosis. No intrarenal masses or calcifications. 2 cm simple cortical cyst arises exophytically from the lower pole. Bladder: Is decompressed and is not well visualized. Other: None. IMPRESSION: 1. Increased cortical echogenicity of both kidneys suggesting changes of underlying medical renal disease. 2. Mild diffuse renal cortical atrophy. Electronically Signed   By: Fidela Salisbury MD   On: 02/28/2020 16:11   DG Chest Port 1 View  Result Date: 03/03/2020 CLINICAL DATA:  Shortness of breath.  COVID. EXAM: PORTABLE CHEST 1 VIEW COMPARISON:  02/29/2020. FINDINGS: Mediastinum hilar structures are unremarkable. Heart size normal. Low lung volumes. Diffuse prominent bilateral interstitial infiltrates again noted. Similar findings noted on prior exam. No prominent pleural effusion. No pneumothorax. Degenerative change thoracic spine. IMPRESSION: Low lung volumes. Diffuse prominent bilateral interstitial filtrates again noted. Similar findings noted on prior exam.  Electronically Signed   By: Coker   On: 03/03/2020 07:04   DG Chest Port 1 View  Result Date: 02/29/2020 CLINICAL DATA:  Shortness of breath, COVID-19. EXAM: PORTABLE CHEST 1 VIEW COMPARISON:  February 25, 2020. FINDINGS: Stable cardiomediastinal silhouette. No pneumothorax or pleural effusion is noted. Stable bilateral lung opacities are noted concerning for multifocal pneumonia due to COVID-19. Bony thorax is unremarkable. IMPRESSION: Stable bilateral lung opacities are noted concerning for multifocal pneumonia due to COVID-19. Electronically Signed   By: Marijo Conception M.D.   On: 02/29/2020 08:13   DG Chest Port 1 View  Result Date: 03/22/2020 CLINICAL DATA:  COVID shortness of breath EXAM: PORTABLE CHEST 1 VIEW COMPARISON:  January 14, 2014. FINDINGS: Trachea midline. Cardiomediastinal contours are stable. Interval development of patchy basilar and mid lung predominant opacities in the LEFT and RIGHT chest both interstitial and airspace component. Process more pronounced at the LEFT lung base. Visualized skeletal structures on limited assessment without acute process. No sign of pleural effusion. IMPRESSION: Patchy basilar and mid lung predominant opacities in the LEFT and RIGHT chest both interstitial and airspace component, findings could be seen in the setting of viral or atypical pneumonia, including COVID-19 infection. Electronically Signed   By: Zetta Bills M.D.   On: 03/07/2020 08:12   DG Chest Port 1V same Day  Result Date: 03/04/2020 CLINICAL DATA:  COVID-19 positivity with hypoxia EXAM: PORTABLE CHEST 1 VIEW COMPARISON:  03/03/2020 FINDINGS: Cardiac shadow is stable. Bibasilar opacities are noted consistent with the given clinical history. The overall appearance is slightly improved when compared with the prior study. No bony abnormality is noted. IMPRESSION: Slight improvement in bilateral airspace disease when compared with the prior study. Electronically Signed   By: Elta Guadeloupe  Lukens M.D.   On: 03/04/2020 08:09

## 2020-03-07 NOTE — Progress Notes (Signed)
I removed to NRB and pt desaturated to 74% on 20L/60%.  Placed NRB back pt Sp02 = 94%.

## 2020-03-08 ENCOUNTER — Inpatient Hospital Stay (HOSPITAL_COMMUNITY): Payer: BC Managed Care – PPO

## 2020-03-08 LAB — POCT I-STAT 7, (LYTES, BLD GAS, ICA,H+H)
Acid-base deficit: 6 mmol/L — ABNORMAL HIGH (ref 0.0–2.0)
Bicarbonate: 22.3 mmol/L (ref 20.0–28.0)
Calcium, Ion: 1.13 mmol/L — ABNORMAL LOW (ref 1.15–1.40)
HCT: 39 % (ref 39.0–52.0)
Hemoglobin: 13.3 g/dL (ref 13.0–17.0)
O2 Saturation: 94 %
Potassium: 4.5 mmol/L (ref 3.5–5.1)
Sodium: 143 mmol/L (ref 135–145)
TCO2: 24 mmol/L (ref 22–32)
pCO2 arterial: 52.9 mmHg — ABNORMAL HIGH (ref 32.0–48.0)
pH, Arterial: 7.233 — ABNORMAL LOW (ref 7.350–7.450)
pO2, Arterial: 85 mmHg (ref 83.0–108.0)

## 2020-03-08 LAB — URINALYSIS, COMPLETE (UACMP) WITH MICROSCOPIC
Bacteria, UA: NONE SEEN
Bilirubin Urine: NEGATIVE
Glucose, UA: NEGATIVE mg/dL
Hgb urine dipstick: NEGATIVE
Ketones, ur: NEGATIVE mg/dL
Leukocytes,Ua: NEGATIVE
Nitrite: NEGATIVE
Protein, ur: NEGATIVE mg/dL
Specific Gravity, Urine: 1.021 (ref 1.005–1.030)
pH: 5 (ref 5.0–8.0)

## 2020-03-08 LAB — GLUCOSE, CAPILLARY
Glucose-Capillary: 169 mg/dL — ABNORMAL HIGH (ref 70–99)
Glucose-Capillary: 174 mg/dL — ABNORMAL HIGH (ref 70–99)
Glucose-Capillary: 187 mg/dL — ABNORMAL HIGH (ref 70–99)
Glucose-Capillary: 190 mg/dL — ABNORMAL HIGH (ref 70–99)
Glucose-Capillary: 215 mg/dL — ABNORMAL HIGH (ref 70–99)

## 2020-03-08 LAB — BASIC METABOLIC PANEL
Anion gap: 21 — ABNORMAL HIGH (ref 5–15)
BUN: 103 mg/dL — ABNORMAL HIGH (ref 8–23)
CO2: 17 mmol/L — ABNORMAL LOW (ref 22–32)
Calcium: 9.5 mg/dL (ref 8.9–10.3)
Chloride: 108 mmol/L (ref 98–111)
Creatinine, Ser: 1.89 mg/dL — ABNORMAL HIGH (ref 0.61–1.24)
GFR calc Af Amer: 37 mL/min — ABNORMAL LOW (ref >60)
GFR calc non Af Amer: 32 mL/min — ABNORMAL LOW (ref >60)
Glucose, Bld: 199 mg/dL — ABNORMAL HIGH (ref 70–99)
Potassium: 4.9 mmol/L (ref 3.5–5.1)
Sodium: 146 mmol/L — ABNORMAL HIGH (ref 135–145)

## 2020-03-08 LAB — CBC
HCT: 54.4 % — ABNORMAL HIGH (ref 39.0–52.0)
Hemoglobin: 18.4 g/dL — ABNORMAL HIGH (ref 13.0–17.0)
MCH: 29.7 pg (ref 26.0–34.0)
MCHC: 33.8 g/dL (ref 30.0–36.0)
MCV: 87.7 fL (ref 80.0–100.0)
Platelets: 231 10*3/uL (ref 150–400)
RBC: 6.2 MIL/uL — ABNORMAL HIGH (ref 4.22–5.81)
RDW: 13.8 % (ref 11.5–15.5)
WBC: 49.1 10*3/uL — ABNORMAL HIGH (ref 4.0–10.5)
nRBC: 0 % (ref 0.0–0.2)

## 2020-03-08 LAB — PROCALCITONIN: Procalcitonin: 0.3 ng/mL

## 2020-03-08 LAB — LACTIC ACID, PLASMA
Lactic Acid, Venous: 6.2 mmol/L (ref 0.5–1.9)
Lactic Acid, Venous: 5.5 mmol/L (ref 0.5–1.9)

## 2020-03-08 MED ORDER — ONDANSETRON HCL 4 MG/2ML IJ SOLN: 4.0000 mg | Freq: Four times a day (QID) | INTRAMUSCULAR | Status: DC | PRN

## 2020-03-08 MED ORDER — FENTANYL 2500MCG IN NS 250ML (10MCG/ML) PREMIX INFUSION
25.0000 ug/h | INTRAVENOUS | Status: DC
  Administered 2020-03-08: 100 ug/h via INTRAVENOUS
  Administered 2020-03-09 – 2020-03-11 (×4): 200 ug/h via INTRAVENOUS
  Filled 2020-03-08 (×7): qty 250

## 2020-03-08 MED ORDER — CHLORHEXIDINE GLUCONATE CLOTH 2 % EX PADS
6.0000 | MEDICATED_PAD | Freq: Every day | CUTANEOUS | Status: DC
  Administered 2020-03-08 – 2020-03-11 (×4): 6 via TOPICAL

## 2020-03-08 MED ORDER — SODIUM CHLORIDE 0.9 % IV SOLN: INTRAVENOUS | Status: AC

## 2020-03-08 MED ORDER — POLYETHYLENE GLYCOL 3350 17 G PO PACK: 17.0000 g | PACK | Freq: Every day | ORAL | Status: DC | PRN

## 2020-03-08 MED ORDER — OXYCODONE HCL 5 MG PO TABS: 5.0000 mg | ORAL_TABLET | ORAL | Status: DC | PRN

## 2020-03-08 MED ORDER — POLYETHYLENE GLYCOL 3350 17 G PO PACK
17.0000 g | PACK | Freq: Every day | ORAL | Status: DC
  Administered 2020-03-09: 17 g
  Filled 2020-03-08: qty 1

## 2020-03-08 MED ORDER — ACETAMINOPHEN 160 MG/5ML PO SOLN: 650.0000 mg | Freq: Four times a day (QID) | ORAL | Status: DC | PRN

## 2020-03-08 MED ORDER — GUAIFENESIN-DM 100-10 MG/5ML PO SYRP
10.0000 mL | ORAL_SOLUTION | ORAL | Status: DC | PRN
  Administered 2020-03-08: 10 mL
  Filled 2020-03-08 (×2): qty 10

## 2020-03-08 MED ORDER — MIDAZOLAM HCL 2 MG/2ML IJ SOLN
2.0000 mg | INTRAMUSCULAR | Status: DC | PRN
  Filled 2020-03-08: qty 2

## 2020-03-08 MED ORDER — TAMSULOSIN HCL 0.4 MG PO CAPS
0.4000 mg | ORAL_CAPSULE | Freq: Every day | ORAL | Status: DC
  Administered 2020-03-08: 0.4 mg via ORAL
  Filled 2020-03-08: qty 1

## 2020-03-08 MED ORDER — AMLODIPINE BESYLATE 10 MG PO TABS
10.0000 mg | ORAL_TABLET | Freq: Every day | ORAL | Status: DC
  Filled 2020-03-08: qty 1

## 2020-03-08 MED ORDER — PHENYLEPHRINE 40 MCG/ML (10ML) SYRINGE FOR IV PUSH (FOR BLOOD PRESSURE SUPPORT)
PREFILLED_SYRINGE | INTRAVENOUS | Status: AC
  Administered 2020-03-08: 400 ug
  Filled 2020-03-08: qty 10

## 2020-03-08 MED ORDER — PANTOPRAZOLE SODIUM 40 MG PO PACK
40.0000 mg | PACK | Freq: Every day | ORAL | Status: DC
  Administered 2020-03-09 – 2020-03-11 (×3): 40 mg
  Filled 2020-03-08 (×3): qty 20

## 2020-03-08 MED ORDER — ONDANSETRON HCL 4 MG PO TABS: 4.0000 mg | ORAL_TABLET | Freq: Four times a day (QID) | ORAL | Status: DC | PRN

## 2020-03-08 MED ORDER — HYDROCOD POLST-CPM POLST ER 10-8 MG/5ML PO SUER: 5.0000 mL | Freq: Two times a day (BID) | ORAL | Status: DC | PRN

## 2020-03-08 MED ORDER — SODIUM CHLORIDE 0.9 % IV BOLUS
1000.0000 mL | Freq: Once | INTRAVENOUS | Status: AC
  Administered 2020-03-08: 1000 mL via INTRAVENOUS

## 2020-03-08 MED ORDER — MIDAZOLAM HCL 2 MG/2ML IJ SOLN
INTRAMUSCULAR | Status: AC
  Administered 2020-03-08: 5 mg via INTRAVENOUS
  Filled 2020-03-08: qty 6

## 2020-03-08 MED ORDER — MIDAZOLAM HCL 2 MG/2ML IJ SOLN
INTRAMUSCULAR | Status: AC
  Administered 2020-03-08: 2 mg
  Filled 2020-03-08: qty 6

## 2020-03-08 MED ORDER — FENTANYL CITRATE (PF) 100 MCG/2ML IJ SOLN
INTRAMUSCULAR | Status: AC
  Filled 2020-03-08: qty 2

## 2020-03-08 MED ORDER — FENTANYL BOLUS VIA INFUSION
25.0000 ug | INTRAVENOUS | Status: DC | PRN
  Administered 2020-03-09: 25 ug via INTRAVENOUS
  Filled 2020-03-08: qty 25

## 2020-03-08 MED ORDER — NOREPINEPHRINE 4 MG/250ML-% IV SOLN
INTRAVENOUS | Status: AC
  Administered 2020-03-08: 4 mg
  Filled 2020-03-08: qty 250

## 2020-03-08 MED ORDER — SODIUM CHLORIDE 0.9 % IV BOLUS
500.0000 mL | Freq: Once | INTRAVENOUS | Status: AC
  Administered 2020-03-08: 500 mL via INTRAVENOUS

## 2020-03-08 MED ORDER — PIPERACILLIN-TAZOBACTAM 3.375 G IVPB
3.3750 g | Freq: Three times a day (TID) | INTRAVENOUS | Status: DC
  Administered 2020-03-08 – 2020-03-09 (×2): 3.375 g via INTRAVENOUS
  Filled 2020-03-08 (×2): qty 50

## 2020-03-08 MED ORDER — SODIUM CHLORIDE 0.9 % IV SOLN
3.0000 g | Freq: Three times a day (TID) | INTRAVENOUS | Status: DC
  Administered 2020-03-08: 3 g via INTRAVENOUS
  Filled 2020-03-08 (×3): qty 8

## 2020-03-08 MED ORDER — POLYETHYLENE GLYCOL 3350 17 G PO PACK: 17.0000 g | PACK | Freq: Every day | ORAL | Status: DC

## 2020-03-08 MED ORDER — DOCUSATE SODIUM 50 MG/5ML PO LIQD
100.0000 mg | Freq: Two times a day (BID) | ORAL | Status: DC
  Administered 2020-03-08: 100 mg via ORAL
  Filled 2020-03-08: qty 10

## 2020-03-08 MED ORDER — ESCITALOPRAM OXALATE 10 MG PO TABS
5.0000 mg | ORAL_TABLET | Freq: Every day | ORAL | Status: DC
  Administered 2020-03-09: 5 mg
  Filled 2020-03-08: qty 1

## 2020-03-08 MED ORDER — NOREPINEPHRINE 16 MG/250ML-% IV SOLN
0.0000 ug/min | INTRAVENOUS | Status: DC
  Administered 2020-03-08: 20 ug/min via INTRAVENOUS
  Administered 2020-03-09: 55 ug/min via INTRAVENOUS
  Administered 2020-03-09: 50 ug/min via INTRAVENOUS
  Administered 2020-03-09: 40 ug/min via INTRAVENOUS
  Administered 2020-03-09 – 2020-03-11 (×7): 50 ug/min via INTRAVENOUS
  Filled 2020-03-08 (×12): qty 250

## 2020-03-08 MED ORDER — MELATONIN 5 MG PO TABS: 10.0000 mg | ORAL_TABLET | Freq: Every evening | ORAL | Status: DC | PRN

## 2020-03-08 MED ORDER — DOCUSATE SODIUM 50 MG/5ML PO LIQD
100.0000 mg | Freq: Two times a day (BID) | ORAL | Status: DC
  Administered 2020-03-09: 100 mg
  Filled 2020-03-08: qty 10

## 2020-03-08 MED ORDER — SIMVASTATIN 20 MG PO TABS
20.0000 mg | ORAL_TABLET | Freq: Every evening | ORAL | Status: DC
  Administered 2020-03-09 – 2020-03-10 (×2): 20 mg
  Filled 2020-03-08 (×2): qty 1

## 2020-03-08 MED ORDER — GLUCERNA SHAKE PO LIQD
237.0000 mL | Freq: Four times a day (QID) | ORAL | Status: DC
  Administered 2020-03-09 (×2): 237 mL

## 2020-03-08 NOTE — Procedures (Signed)
Arterial Catheter Insertion Procedure Note  Brandon Santos  [id number]  [birth year]  Date:03/08/20  Time:5:28 PM    Provider Performing: Audria Nine    Procedure: Insertion of Arterial Line [phone]) with US guidance (24580)   Indication(s) Blood pressure monitoring and/or need for frequent ABGs  Consent Unable to obtain consent due to emergent nature of procedure.  Anesthesia see mar   Time Out Verified patient identification, verified procedure, site/side was marked, verified correct patient position, special equipment/implants available, medications/allergies/relevant history reviewed, required imaging and test results available.   Sterile Technique Maximal sterile technique including full sterile barrier drape, hand hygiene, sterile gown, sterile gloves, mask, hair covering, sterile ultrasound probe cover (if used).   Procedure Description Area of catheter insertion was cleaned with chlorhexidine and draped in sterile fashion. With real-time ultrasound guidance an arterial catheter was placed into the right radial artery.  Appropriate arterial tracings confirmed on monitor.     Complications/Tolerance None; patient tolerated the procedure well.   EBL Minimal   Specimen(s) None

## 2020-03-08 NOTE — Progress Notes (Signed)
Updated son at this time after intubation and emergent line and arterial line placement.

## 2020-03-08 NOTE — Progress Notes (Signed)
   03/08/20 1300  Assess: MEWS Score  Temp (!) 96.7 F (35.9 C)  BP 135/85  Pulse Rate 83  ECG Heart Rate 83  Resp (!) 27  SpO2 91 %  Assess: MEWS Score  MEWS Temp 1  MEWS Systolic 0  MEWS Pulse 0  MEWS RR 2  MEWS LOC 1  MEWS Score 4  MEWS Score Color Red  Assess: if the MEWS score is Yellow or Red  Were vital signs taken at a resting state? Yes  Focused Assessment Change from prior assessment (see assessment flowsheet)  Early Detection of Sepsis Score *See Row Information* Low  MEWS guidelines implemented *See Row Information* Yes  Treat  MEWS Interventions Administered scheduled meds/treatments  Pain Scale 0-10  Pain Score 0  Take Vital Signs  Increase Vital Sign Frequency  Red: Q 1hr X 4 then Q 4hr X 4, if remains red, continue Q 4hrs  Escalate  MEWS: Escalate Red: discuss with charge nurse/RN and provider, consider discussing with RRT  Notify: Charge Nurse/RN  Name of Charge Nurse/RN Notified Erin  Date Charge Nurse/RN Notified 03/08/20  Time Charge Nurse/RN Notified [id number]  Notify: Provider  Provider Name/Title Elgergawy  Date Provider Notified 03/08/20  Time Provider Notified 1300  Notification Type Page  Notification Reason Other (Comment) (unstable and still on the unit)  Response See new orders  Date of Provider Response 03/08/20  Time of Provider Response 1400  Document  Patient Outcome Not stable and remains on department  Progress note created (see row info) Yes

## 2020-03-08 NOTE — Progress Notes (Signed)
HOSPITAL MEDICINE OVERNIGHT EVENT NOTE    Per nursing, patient was complaining of mild abdominal pain earlier in the evening shift.  Bladder scan this morning revealed 404cc's in the bladder, patient unable to void.   Will perform intermittent cath, obtain UA/Ucx.  Monitor for recurrence.  Patient has Hx of BPH, will leave initiation of pharmacotherapy up to the day team at their discretion.   Vernelle Emerald  MD [facility]

## 2020-03-08 NOTE — Plan of Care (Signed)
Lengthy d/w son via phone prior to transfer of pt. D/w him we were attempting to transfer him to ICU for intubation.   Son had numerous questions that were answered to the best of my ability.  Ultimately I explained that he was deteriorating with escalating oxygen, increased confusion and fatigue and increased wob. Due to his lethargy he is not a candidate for NIV at this time and his continued increased wob.   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   Did d/w him mortality rates with this lengthy hospitalization, deterioration despite aggressive measures and potentially new superimposed bacterial pneumonia with a lactate of >6, worsening renal function with bun of 103. He continued to desire aggressive care.    Cont aggressive measures.

## 2020-03-08 NOTE — Procedures (Signed)
Central Venous Catheter Insertion Procedure Note  Avish Torry  [id number]  1933/09/29  Date:03/08/20  Time:5:31 PM   Provider Performing:[facility] Duan [name]   Procedure: Insertion of Non-tunneled Central Venous Catheter(36556) with US guidance (45625)   Indication(s) Medication administration  Consent Unable to obtain consent due to emergent nature of procedure.  Anesthesia see mar   Timeout Verified patient identification, verified procedure, site/side was marked, verified correct patient position, special equipment/implants available, medications/allergies/relevant history reviewed, required imaging and test results available.  Sterile Technique Maximal sterile technique including full sterile barrier drape, hand hygiene, sterile gown, sterile gloves, mask, hair covering, sterile ultrasound probe cover (if used).  Procedure Description Area of catheter insertion was cleaned with chlorhexidine and draped in sterile fashion.  With real-time ultrasound guidance a central venous catheter was placed into the left internal jugular vein. Nonpulsatile blood flow and easy flushing noted in all ports.  The catheter was sutured in place and sterile dressing applied.  Complications/Tolerance None; patient tolerated the procedure well. Chest X-ray is ordered to verify placement for internal jugular or subclavian cannulation.   Chest x-ray is not ordered for femoral cannulation.  EBL Minimal  Specimen(s) None   Line was done under u/s guidance. Easily compressible vein noted with neighboring pulsatile artery. Upon stick dark red non pulsatile blood was noted. Wire easily advanced. Wire was verified to be in easily compressible/non pulsatile vessel in both cross sectional and longitudinal views under ultrasound guidance. Skin was easily dilated and catheter advanced without issue. Wire was withdrawn from vessel. No air was aspirated thru entirety of the procedure. Pt tolerated well. No  complications were appreciated.

## 2020-03-08 NOTE — H&P (Signed)
NAMEChristianjames Santos, MRN:  [id number], DOB:  Jul 11, 1933, LOS: 22 ADMISSION DATE:  03/05/2020, CONSULTATION DATE:  03/08/2020 REFERRING MD:  Elgergawy, CHIEF COMPLAINT:  Acute Respiratory Failure   Brief History   86 yo unvaccinated male not feeling well for 9-10 days with increasing SOB. Found to be covid positive. Quickly deteriorated and required 15LPM per NRB. New secondary  bacterial pneumonia per CT 9/14, suspect aspiration. Now  tiring out , and decompensating, increased WOB and drop in sats.  PCCM have been asked to intubate transfer to ICU and manage care.  History of present illness   86 yo unvaccinated male not feeling well for 9-10 days with increasing SOB. Found to be covid positive. Quickly deteriorated and required 15LPM per NRB. New secondary  bacterial pneumonia per CT 9/14, suspect aspiration. Now  tiring out , increased WOB and drop in sats.with tenuous respiratory status and severe hypoxemia.  He  is on 15 L heated high flow and 15 L NRB. He has received Actemra 9/3,  IV steroids and IV remdesivir. He has not been compliant with incentive spirometry, flutter valve and getting OOB to chair.   WBC has bumped from 24.2 ( 9/13) to 49.1 on 9/14. CT Chest 03/08/2020  shows patchy largely peripheral ground-glass type infiltrates throughout both lungs consistent with atypical/viral pneumonia such as COVID pneumonia. There is also dense airspace consolidation in the left lower lobe which may be a secondary bacterial pneumonia or possible aspiration. Unasyn was started Empirically by [facility].   Lactic Acid 9/14 was 6.2  Pro calcitonin 9/14>> 0.30  [facility] spoke with son  extensively about  Intubation, but patient remains a full code . Patient wife is currently on 69M, also unvaccinated and  Covid +  PCCM have been asked to intubate transfer to ICU and manage care.  Past Medical History  PMH DM, HLD, HTN, lumbar fusion.  Significant Hospital Events   9/2>>Chest x-ray: Patchy bilateral  lung infiltrates 9/5>> renal ultrasound: Increased cortical echogenicity of both kidneys suggesting underlying medical renal disease, no hydronephrosis. 9/6>> chest x-ray: Stable bilateral lung opacities-concerning for multifocal pneumonia 9/9>> diffuse bilateral interstitial changes 9/9>> worsening hypoxemia-transition to heated high flow with NRB 9/10>> chest x-ray: Slight improvement in bilateral airspace disease when compared with the prior study  Consults:  9/14 PCCM  Procedures:  03/08/2020 >> Intubation  Significant Diagnostic Tests:  9/14 CT Chest  Changes consistent with atypical/viral pneumonia such as COVID pneumonia. Dense airspace consolidation in the left lower lobe could be a secondary bacterial pneumonia or possible aspiration.  CT Abdomen and Pelvis 9/14 No acute abdominal/pelvic findings or adenopathy. Partially calcified mesenteric lesion, possibly a benign process such as mesenteric infarct or other remote mesenteric inflammatory process. A carcinoid tumor would be another possibility. Cholelithiasis. Aortic atherosclerosis.  CXR 9/14 Interstitial prominence with bibasilar airspace opacities most compatible with pneumonia. No change since prior study KUB 9/14 Bowel gas pattern is unremarkable. LEFT flank is however excluded from view. Stool and gas in the RIGHT lower quadrant.   Micro Data:  Urine Cx 9/14>> SARS Coronavirus 2 on 03/19/2020>> Positive 9/14>> Blood 9/14>> Sputum   Antimicrobials:  Unasyn 9/14>>    Interim history/subjective:  Pt remains on  15 L heated high flow and 15 L NRB., and is decompensating in setting of a secondary bacterial pneumonia most likely 2/2 to aspiration.  WBC 49.1 HGB 18.4 Lactate 6.2 Pro calcitonin  0.30 CRP 9/13 < 0.5 Ferretin 9/13 >>346 D dimer 03/07/2020>> 0.44 Needs transfer  to ICU and intubation Will obtain BC and sputum Cx [facility] has started Unasyn Net negative 2.551 L  Afebrile   Objective   Blood  pressure 132/67, pulse 77, temperature (!) 97 F (36.1 C), temperature source Axillary, resp. rate (!) 27, height 5\' 6"  (1.676 m), weight 85 kg, SpO2 90 %.    FiO2 (%):  [60 %] 60 %   Intake/Output Summary (Last 24 hours) at 03/08/2020 1503 Last data filed at 03/08/2020 1100 Gross per 24 hour  Intake 400 ml  Output --  Net 400 ml   Filed Weights   02/24/2020 1200 02/26/20 0800  Weight: 84.5 kg 85 kg    Examination: General: Arousable, answering simple questions, fatigued, respiratory distress HENT: NCAT, No LAD, MM dry, No JVD, Lungs: Bilateral chest excursion. Coarse throughout , diminished per bases Cardiovascular: S1, S2, RRR, No RMG Abdomen: Soft, ND, NT, BS + Extremities: Warm and dry, no obvious deformities,  Neuro: Tired, but follows some commands, MAE x 4 weakly, Alert to self and place GU: Did not assess  Resolved Hospital Problem list     Assessment & Plan:  Acute on Chronic Respiratory Failure Covid 19 Pneumonia in unvaccinated patient  Secondary Bacterial Pneumonia 2/2 Aspiration  Afebrile Plan Transfer to ICU Intubate upon arrival CXR post intubation and in am  ABG 1 hour after intubation and in am  Lung Protective strategy, and ARDS Net protocol as indicated Wean FiO2 and PEEP as able  No SBT in am   Leukocytosis Increased overnight 9/13 - 9/14 from 24.2>>49.1 Afebrile Plan BC x 2 Sputum Cx Urine Cx Follow micro Unasyn as ordered Consider coverage for atypicals Trend WBC and Fever Curve  Elevated Lactic Acid  Currently hemodynamically stable  Plan Trend lactate NS bolus 500 cc's now , fluid resuscitation once in ICU Maintain MAP > 65  Acute on chronic Kidney Injury Plan Trend BMET Monitor UO Maintain renal perfusion Avoid nephrotoxic medications     Best practice:  Diet: NPO Pain/Anxiety/Delirium protocol (if indicated): Fentanyl  VAP protocol (if indicated): Initiated DVT prophylaxis: Lovenox GI prophylaxis: Protonix Glucose  control: CBG with SSI Mobility: BR Code Status: Full Family Communication: Dr. Kennon Rounds updated son Disposition: ICU  Labs   CBC: Recent Labs  Lab 03/04/20 1206 03/05/20 0300 03/06/20 1746 03/07/20 0500 03/08/20 0549  WBC 21.0* 20.2* 23.4* 24.2* 49.1*  HGB 16.6 15.3 16.4 17.0 18.4*  HCT 49.1 45.0 49.4 51.4 54.4*  MCV 87.8 86.9 87.3 89.2 87.7  PLT 364 368 335 330 625    Basic Metabolic Panel: Recent Labs  Lab 03/04/20 1206 03/05/20 0300 03/06/20 1746 03/07/20 0500 03/08/20 0549  NA 142 142 144 145 146*  K 4.0 4.4 4.4 4.5 4.9  CL 100 102 105 108 108  CO2 27 27 25 25  17*  GLUCOSE 131* 167* 197* 177* 199*  BUN 51* 59* 72* 77* 103*  CREATININE 1.42* 1.53* 1.59* 1.53* 1.89*  CALCIUM 8.9 8.9 9.2 9.3 9.5   GFR: Estimated Creatinine Clearance: 30.3 mL/min (A) (by C-G formula based on SCr of 1.89 mg/dL (H)). Recent Labs  Lab 03/05/20 0300 03/06/20 1746 03/07/20 0500 03/08/20 0549 03/08/20 1402  PROCALCITON  --   --   --   --  0.30  WBC 20.2* 23.4* 24.2* 49.1*  --   LATICACIDVEN  --   --   --   --  6.2*    Liver Function Tests: Recent Labs  Lab 03/03/20 0621 03/04/20 1206 03/05/20 0300 03/06/20 1746 03/07/20 0500  AST 126* 49* 53* 22 21  ALT 245* 171* 168* 99* 84*  ALKPHOS 63 68 64 63 68  BILITOT 0.8 1.0 1.0 1.0 1.1  PROT 5.7* 6.1* 5.5* 5.9* 6.2*  ALBUMIN 2.6* 2.9* 2.6* 2.7* 2.8*   No results for input(s): LIPASE, AMYLASE in the last 168 hours. No results for input(s): AMMONIA in the last 168 hours.  ABG    Component Value Date/Time   PHART 7.500 (H) 03/04/2020 2220   PCO2ART 35.1 03/04/2020 2220   PO2ART 53.8 (L) 03/04/2020 2220   HCO3 27.1 03/04/2020 2220   O2SAT 87.1 03/04/2020 2220     Coagulation Profile: No results for input(s): INR, PROTIME in the last 168 hours.  Cardiac Enzymes: No results for input(s): CKTOTAL, CKMB, CKMBINDEX, TROPONINI in the last 168 hours.  HbA1C: Hemoglobin A1C  Date/Time Value Ref Range Status  08/10/2019  08:22 AM 5.9 (A) 4.0 - 5.6 % Final   Hgb A1c MFr Bld  Date/Time Value Ref Range Status  02/04/2020 09:57 AM 7.0 (H) <5.7 % of total Hgb Final    Comment:    For someone without known diabetes, a hemoglobin A1c value of 6.5% or greater indicates that they may have  diabetes and this should be confirmed with a follow-up  test. . For someone with known diabetes, a value <7% indicates  that their diabetes is well controlled and a value  greater than or equal to 7% indicates suboptimal  control. A1c targets should be individualized based on  duration of diabetes, age, comorbid conditions, and  other considerations. . Currently, no consensus exists regarding use of hemoglobin A1c for diagnosis of diabetes for children. Marland Kitchen   01/29/2019 09:57 AM 6.0 4.6 - 6.5 % Final    Comment:    Glycemic Control Guidelines for People with Diabetes:Non Diabetic:  <6%Goal of Therapy: <7%Additional Action Suggested:  >8%     CBG: Recent Labs  Lab 03/07/20 1157 03/07/20 1646 03/07/20 2120 03/08/20 0750 03/08/20 1152  GLUCAP 216* 208* 285* 190* 215*    Review of Systems:   Unable as patient is too lethargic to answer questions  Past Medical History  He,  has a past medical history of BPH (benign prostatic hyperplasia), Colon polyp, Diabetes mellitus without complication ([place]), Hemorrhoid, Hyperlipemia, Hypertension, and Spinal stenosis.   Surgical History    Past Surgical History:  Procedure Laterality Date   APPENDECTOMY  1966   COLONOSCOPY  2013   had it close to end of year, exam was normal   Drain fluid from lungs  1938   states "I was 86 year old"   LUMBAR WOUND DEBRIDEMENT N/A 02/24/2014   Procedure: Revision of Lumbar Wound;  Surgeon: Eustace Moore, MD;  Location: [place];  Service: Neurosurgery;  Laterality: N/A;  Revision of Lumbar Wound   MAXIMUM ACCESS (MAS)POSTERIOR LUMBAR INTERBODY FUSION (PLIF) 1 LEVEL N/A 01/21/2014   Procedure: FOR MAXIMUM ACCESS (MAS) POSTERIOR  LUMBAR INTERBODY FUSION (PLIF) LUMBAR FOUR FIVE;  Surgeon: Eustace Moore, MD;  Location: [place] [facility];  Service: Neurosurgery;  Laterality: N/A;     Social History   reports that he has never smoked. He has never used smokeless tobacco. He reports that he does not drink alcohol and does not use drugs.   Family History   His family history includes Heart disease in his father. There is no history of Colon cancer.   Allergies No Known Allergies   Home Medications  Prior to Admission medications  Medication Sig Start Date End Date Taking? Authorizing Provider  amLODipine (NORVASC) 5 MG tablet Take 1 tablet (5 mg total) by mouth daily. 08/10/19  Yes Vivi Barrack, MD  lisinopril-hydrochlorothiazide (ZESTORETIC) 20-12.5 MG tablet Take 2 tablets by mouth daily. 08/10/19  Yes Vivi Barrack, MD  metFORMIN (GLUCOPHAGE) 1000 MG tablet Take 1 tablet (1,000 mg total) by mouth 2 (two) times daily with a meal. 08/10/19  Yes Vivi Barrack, MD  metoprolol tartrate (LOPRESSOR) 25 MG tablet Take 1 tablet (25 mg total) by mouth 2 (two) times daily. 08/10/19  Yes Vivi Barrack, MD  Multiple Vitamins-Minerals (CENTRUM PO) Take 1 tablet by mouth daily.    Yes [provider]  ROCKLATAN 0.02-0.005 % SOLN Apply 1 drop to eye in the morning and at bedtime.  12/29/19  Yes [provider]  simvastatin (ZOCOR) 20 MG tablet Take 1 tablet (20 mg total) by mouth every evening. 08/10/19  Yes Vivi Barrack, MD  timolol (TIMOPTIC) 0.5 % ophthalmic solution Place 1 drop into both eyes in the morning and at bedtime.  02/04/16  Yes [provider]  glucose blood (ONETOUCH VERIO) test strip Use as instructed to check blood sugar once a day. 02/05/14   Lucretia Kern, DO  [facility] DELICA LANCETS 80K MISC Use as directed to check blood sugar once a day 02/05/14   Lucretia Kern, DO     Critical care time: 71 minutes    Magdalen Spatz, MSN, AGACNP-BC [place] for personal pager PCCM on call pager [phone] 03/08/2020 4:43 PM

## 2020-03-08 NOTE — Progress Notes (Signed)
eLink Physician-Brief Progress Note Patient Name: Brandon Santos DOB: 1934-01-28 MRN: [id number]   Date of Service  03/08/2020  HPI/Events of Note  on levo. No order for it. Can you make it quad strength please?  eICU Interventions  ordered     Intervention Category Minor Interventions: Other:  Elmer Sow 03/08/2020, 9:42 PM

## 2020-03-08 NOTE — Progress Notes (Signed)
   03/08/20 0740  Assess: MEWS Score  Temp 97.9 F (36.6 C)  BP (!) 146/72  Pulse Rate 87  ECG Heart Rate 86  Resp (!) 26  SpO2 92 %  Assess: MEWS Score  MEWS Temp 0  MEWS Systolic 0  MEWS Pulse 0  MEWS RR 2  MEWS LOC 0  MEWS Score 2  MEWS Score Color Yellow  Assess: if the MEWS score is Yellow or Red  Were vital signs taken at a resting state? Yes  Focused Assessment No change from prior assessment  Early Detection of Sepsis Score *See Row Information* Low  MEWS guidelines implemented *See Row Information* Yes  Treat  MEWS Interventions Administered scheduled meds/treatments  Pain Scale 0-10  Take Vital Signs  Increase Vital Sign Frequency  Yellow: Q 2hr X 2 then Q 4hr X 2, if remains yellow, continue Q 4hrs  Escalate  MEWS: Escalate Yellow: discuss with charge nurse/RN and consider discussing with provider and RRT  Notify: Charge Nurse/RN  Name of Charge Nurse/RN Notified [place]  Date Charge Nurse/RN Notified 03/08/20  Time Charge Nurse/RN Notified 0900  Notify: Provider  Provider Name/Title Elgergawy  Date Provider Notified 03/08/20  Time Provider Notified 0930  Notification Type Face-to-face  Notification Reason Change in status  Response See new orders  Date of Provider Response 03/08/20  Time of Provider Response 0930  Document  Patient Outcome Not stable and remains on department  Progress note created (see row info) Yes

## 2020-03-08 NOTE — Progress Notes (Signed)
Inpatient Diabetes Program Recommendations  AACE/ADA: New Consensus Statement on Inpatient Glycemic Control   Target Ranges:  Prepandial:   less than 140 mg/dL      Peak postprandial:   less than 180 mg/dL (1-2 hours)      Critically ill patients:  140 - 180 mg/dL   Results for JWAN, HORNBAKER (MRN [id number]) as of 03/08/2020 11:40  Ref. Range 03/07/2020 08:07 03/07/2020 11:57 03/07/2020 16:46 03/07/2020 21:20 03/08/2020 07:50  Glucose-Capillary Latest Ref Range: 70 - 99 mg/dL 196 (H) 216 (H) 208 (H) 285 (H) 190 (H)   Review of Glycemic Control  Current orders for Inpatient glycemic control: Levemir 7 units BID, Novolog 0-20 units TID with meals, Novolog 0-5 units QHS; Solumedrol 40 mg Q12H  Inpatient Diabetes Program Recommendations:    Insulin: If steroids are continued as ordered, please consider ordering Novolog 4 units TID with meals if patient eats at least 50% of meals.  Thanks, Barnie Alderman, RN, MSN, CDE Diabetes Coordinator Inpatient Diabetes Program [phone] (Team Pager from 8am to 5pm)

## 2020-03-08 NOTE — Progress Notes (Signed)
Pt bladder scanned at 0000 per orders, less than 315ml in bladder at this time, encouraged pt to attempt to void when he feels the urge to urinate.   0600 pt has had no measured urine output since previous bladder scan, pt bladder scanned at this time, currently 434ml in bladder. Pt has no complaints of abdominal pain, pt stated he was actively voiding but had no output. MD paged and made aware of situation. Orders placed at this time.   Intermittent cath performed using sterile procedure, 383ml of clear, amber colored urine collected, urine sample sent to lab. Pt has no complaints at this time will CTM.   Spoke with son this AM, updated on pts condition.

## 2020-03-08 NOTE — Progress Notes (Signed)
   03/08/20 1405  Assess: MEWS Score  Temp (!) 97 F (36.1 C)  BP 132/67  Pulse Rate 77  ECG Heart Rate 77  Resp (!) 27  Level of Consciousness Alert  SpO2 90 %  O2 Device HFNC;Non-rebreather Mask  Patient Activity (if Appropriate) In bed  O2 Flow Rate (L/min) 15 L/min  Document  Patient Outcome Not stable and remains on department  Progress note created (see row info) Yes

## 2020-03-08 NOTE — Progress Notes (Signed)
   03/08/20 1313  Assess: MEWS Score  Temp (!) 96.6 F (35.9 C)  Assess: MEWS Score  MEWS Temp 1  MEWS Systolic 0  MEWS Pulse 0  MEWS RR 2  MEWS LOC 1  MEWS Score 4  MEWS Score Color Red  Take Vital Signs  Increase Vital Sign Frequency  Red: Q 1hr X 4 then Q 4hr X 4, if remains red, continue Q 4hrs  Escalate  MEWS: Escalate Red: discuss with charge nurse/RN and provider, consider discussing with RRT  Notify: Charge Nurse/RN  Name of Charge Nurse/RN Notified [place]  Date Charge Nurse/RN Notified 03/08/20  Time Charge Nurse/RN Notified [id number]  Document  Patient Outcome Not stable and remains on department  Progress note created (see row info) Yes

## 2020-03-08 NOTE — Progress Notes (Addendum)
   03/08/20 1405  Assess: MEWS Score  Temp (!) 97 F (36.1 C)  BP 132/67  Pulse Rate 77  ECG Heart Rate 77  Resp (!) 27  Level of Consciousness Alert  SpO2 90 %  O2 Device HFNC;Non-rebreather Mask  Patient Activity (if Appropriate) In bed  O2 Flow Rate (L/min) 15 L/min  Assess: MEWS Score  MEWS Temp 0  MEWS Systolic 0  MEWS Pulse 0  MEWS RR 2  MEWS LOC 0  MEWS Score 2  MEWS Score Color Yellow  Take Vital Signs  Increase Vital Sign Frequency  Yellow: Q 2hr X 2 then Q 4hr X 2, if remains yellow, continue Q 4hrs  Document  Patient Outcome Not stable and remains on department  Progress note created (see row info) Yes   Patient Axox4, slow to respond and cool to the touch,lung sounds diminshed. Patient color pale, skin dry and darkening around peripheral extermities. Notifed MD of patient changes new orders to transfer patient off unit.

## 2020-03-08 NOTE — Progress Notes (Addendum)
PROGRESS NOTE                                                                                                                                                                                                             Patient Demographics:    Brandon Santos, is a 86 y.o. male, DOB - 07-11-1933, OXB:[id number]  Outpatient Primary MD for the patient is Vivi Barrack, MD   Admit date - 03/06/2020   LOS - 44  Chief Complaint  Patient presents with   Shortness of Breath       Brief Narrative: Patient is a 86 y.o. male with PMHx of HTN, HLD, DM-2-who was not feeling well for the past 9-10 days-presented to the ED with several days of worsening shortness of breath.  He was found to have acute hypoxic respiratory failure secondary to COVID-19 pneumonia and admitted to the hospitalist service.  Post admission-he deteriorated with worsening oxygen requirement-and was placed on 15 L HFNC with NRB.  COVID-19 vaccinated status: Unvaccinated  Significant Events: 9/2>> Admit to [facility] for for severe hypoxemia secondary to COVID-19 pneumonia requiring 15 L of salter high flow  Significant studies: 9/2>>Chest x-ray: Patchy bilateral lung infiltrates 9/5>> renal ultrasound: Increased cortical echogenicity of both kidneys suggesting underlying medical renal disease, no hydronephrosis. 9/6>> chest x-ray: Stable bilateral lung opacities-concerning for multifocal pneumonia 9/9>> diffuse bilateral interstitial changes 9/9>> worsening hypoxemia-transition to heated high flow with NRB 9/10>> chest x-ray: Slight improvement in bilateral airspace disease when compared with the prior study.  COVID-19 medications: Steroids: 9/2>> Remdesivir: 9/2>>9/6 Actemra: 9/3 x 1   Antibiotics: Unasyn 9/14  Microbiology data: None  Procedures: None  Consults: None  DVT prophylaxis: Prophylactic Lovenox-at twice daily dosing.    Subjective:    Patient appears to be more confused today, he has been noncompliant with out of bed to chair and incentive spirometer .   Assessment  & Plan :   Acute Hypoxic Resp Failure due to Covid 19 Viral pneumonia:  -Respiratory status remains very tenuous, continues to have severe hypoxemia.  He does have some improvement in oxygen requirement, this morning he is on 15 L heated high flow and 15 L NRB. - No signs of volume overload  -Received Actemra 9/3 -Treated with IV steroids -Treated with IV remdesivir -I have discussed with him again importance about incentive  spirometry, flutter valve and out of bed to chair, as he did not comply with these recommendations yesterday. -Respiratory status remains very tenuous, high risk for decompensation. -Patient with worsening leukocytosis at 40 9K today, I will check procalcitonin, will check lactic acid, I have obtain CT chest/abdomen/pelvis, significant for dense left lung base opacity, highly suspicious for bacterial pneumonia, will go ahead and start empirically on IV Unasyn.  Will check procalcitonin, lactic acid, as patient appears septic, PCCM has been consulted.  Fever: afebrile O2 requirements:  SpO2: 91 % O2 Flow Rate (L/min): 15 L/min FiO2 (%): 60 %   COVID-19 Labs: Recent Labs    03/06/20 1746 03/07/20 0500  DDIMER 0.57* 0.44  FERRITIN 380* 346*  CRP 0.5 <0.5       Component Value Date/Time   BNP 122.6 (H) 02/27/2020 0536    No results for input(s): PROCALCITON in the last 168 hours.  Lab Results  Component Value Date   SARSCOV2NAA POSITIVE (A) 03/18/2020     Prone/Incentive Spirometry: encouraged  incentive spirometry use 3-4/hour, encourage prone position-2-3 hours at a time for up to 15 hours a day.  AKI:  - Likely hemodynamically mediated-suspect has some amount of underlying CKD.  -UA without proteinuria, but renal ultrasound without hydronephrosis, but some suggestion of medical renal disease. -On Lasix as  needed. -Patient had worsening creatinine 1.89 today, he had evidence of retention overnight where he required 1 time in and out.  Urinary retention/BPH -He did require in and out catheter overnight, he is started on Flomax, will monitor bladder scan every 8 hours, and insert Foley catheter if he has recurrent urinary retention.  Transaminitis:  - Appears to be mild-stable for close monitoring-abdominal exam is benign.  Acute metabolic encephalopathy:  - Secondary to hypoxemia/acute illness-relatively awake and alert this morning.  However he does get intermittently confused.  Continue to maintain delirium precautions.  He has a nonfocal exam.  -As well some hospital related delirium  Choking/dysphagia:  -He is being followed by SLP.  DM-2 (A1c 7.0 on 8/12) with uncontrolled hyperglycemia due to steroids:  -CBG on the higher side, but he has a poor appetite, so I will hold on increasing his Levemir   Recent Labs    03/07/20 2120 03/08/20 0750 03/08/20 1152  GLUCAP 285* 190* 215*   HTN:  -Blood pressure is acceptable today, continue with Norvasc, I will stop hydralazine for now HLD: Continue statin  Deconditioning/debility: Secondary to acute illness-PT following-recommendations are for SNF.  Obesity: Estimated body mass index is 30.25 kg/m as calculated from the following:   Height as of this encounter: 5\' 6"  (1.676 m).   Weight as of this encounter: 85 kg.   Will start on low dose Lexapro   GI prophylaxis: PPI  ABG:    Component Value Date/Time   PHART 7.500 (H) 03/04/2020 2220   PCO2ART 35.1 03/04/2020 2220   PO2ART 53.8 (L) 03/04/2020 2220   HCO3 27.1 03/04/2020 2220   O2SAT 87.1 03/04/2020 2220    Vent Settings: N/A  Condition - Extremely Guarded-very tenuous with risk for further deterioration clinical  Family Communication  :  Son Milta Deiters [phone]) updated daily.  I had a lengthy discussion with the son, I have updated him about the new diagnosis of  bacterial pneumonia, and worsening medical status of the patient, and the patient is extremely ill. Addendum: I have called son at 4:10 PM, updated him patient status, transferred to ICU, possible need for intubation, son  reports patient  is a full code, with full scope of treatment.  Code Status :  Full Code  Diet :  Diet Order            DIET - DYS 1 Room service appropriate? Yes; Fluid consistency: Thin  Diet effective now                  Disposition Plan  :   Status is: Inpatient  Remains inpatient appropriate because:Inpatient level of care appropriate due to severity of illness  Dispo: The patient is from: Home              Anticipated d/c is to: TBD              Anticipated d/c date is: > 3 days              Patient currently is not medically stable to d/c.   Barriers to discharge: Hypoxia requiring O2 supplementation  Antimicorbials  :    Anti-infectives (From admission, onward)   Start     Dose/Rate Route Frequency Ordered Stop   02/29/20 1045  remdesivir 100 mg in sodium chloride 0.9 % 100 mL IVPB        100 mg 200 mL/hr over 30 Minutes Intravenous Daily 02/29/20 1039 02/29/20 1135   02/26/20 1000  remdesivir 100 mg in sodium chloride 0.9 % 100 mL IVPB       "Followed by" Linked Group Details   100 mg 200 mL/hr over 30 Minutes Intravenous Daily 03/06/2020 1014 02/29/20 1140   03/06/2020 1100  remdesivir 200 mg in sodium chloride 0.9% 250 mL IVPB       "Followed by" Linked Group Details   200 mg 580 mL/hr over 30 Minutes Intravenous Once 02/26/2020 1014 03/08/2020 1327      Inpatient Medications  Scheduled Meds:  (feeding supplement) PROSource Plus  30 mL Oral TID BM   amLODipine  10 mg Oral Daily   chlorhexidine  15 mL Mouth Rinse BID   enoxaparin (LOVENOX) injection  40 mg Subcutaneous Q12H   escitalopram  5 mg Oral Daily   feeding supplement (GLUCERNA SHAKE)  237 mL Oral QID   hydrALAZINE  25 mg Oral Q6H   insulin aspart  0-20 Units Subcutaneous  TID WC   insulin aspart  0-5 Units Subcutaneous QHS   insulin detemir  7 Units Subcutaneous BID   latanoprost  1 drop Both Eyes QHS   mouth rinse  15 mL Mouth Rinse BID   methylPREDNISolone (SOLU-MEDROL) injection  40 mg Intravenous Q12H   metoprolol tartrate  5 mg Intravenous Q6H   pantoprazole  40 mg Oral Q1200   simvastatin  20 mg Oral QPM   sodium chloride flush  3 mL Intravenous Q12H   tamsulosin  0.4 mg Oral QPC supper   timolol  1 drop Both Eyes Daily   Continuous Infusions:  sodium chloride     PRN Meds:.sodium chloride, acetaminophen, albuterol, bisacodyl, chlorpheniramine-HYDROcodone, guaiFENesin-dextromethorphan, hydrALAZINE, melatonin, ondansetron **OR** ondansetron (ZOFRAN) IV, oxyCODONE, polyethylene glycol, Resource ThickenUp Clear, sodium chloride, sodium chloride flush, sodium phosphate   Time Spent in minutes  35   See all Orders from today for further details   Phillips Climes M.D on 03/08/2020 at 1:09 PM  To page go to [email or web address] - use universal password  [facility] -  Office  [phone]    Objective:   Vitals:   03/08/20 0740 03/08/20 0934 03/08/20 1108 03/08/20 1300  BP: (!) 146/72 (!) 147/72 Marland Kitchen)  144/69 135/85  Pulse: 87 83 83 83  Resp: (!) 26 (!) 26 (!) 32 (!) 27  Temp: 97.9 F (36.6 C) 97.6 F (36.4 C) (!) 97.1 F (36.2 C) (!) 96.7 F (35.9 C)  TempSrc: Axillary Axillary Axillary Axillary  SpO2: 92% 91% 91% 91%  Weight:      Height:        Wt Readings from Last 3 Encounters:  02/26/20 85 kg  02/04/20 84.5 kg  08/10/19 85.4 kg     Intake/Output Summary (Last 24 hours) at 03/08/2020 1309 Last data filed at 03/08/2020 1100 Gross per 24 hour  Intake 400 ml  Output --  Net 400 ml     Physical Exam  Patient is more lethargic today, less interactive, he is more confused, extremely frail, deconditioned.   Symmetrical chest wall movement bilaterally, diminished at the bases, with significant Rales in left  lung RRR,No Gallops,Rubs or new Murmurs, No Parasternal Heave +ve B.Sounds, Abd Soft, No tenderness, No rebound - guarding or rigidity. No Cyanosis, Clubbing or edema, No new Rash or bruise       Data Review:    CBC Recent Labs  Lab 03/04/20 1206 03/05/20 0300 03/06/20 1746 03/07/20 0500 03/08/20 0549  WBC 21.0* 20.2* 23.4* 24.2* 49.1*  HGB 16.6 15.3 16.4 17.0 18.4*  HCT 49.1 45.0 49.4 51.4 54.4*  PLT 364 368 335 330 231  MCV 87.8 86.9 87.3 89.2 87.7  MCH 29.7 29.5 29.0 29.5 29.7  MCHC 33.8 34.0 33.2 33.1 33.8  RDW 13.4 13.3 13.4 13.8 13.8    Chemistries  Recent Labs  Lab 03/03/20 0621 03/03/20 0621 03/04/20 1206 03/05/20 0300 03/06/20 1746 03/07/20 0500 03/08/20 0549  NA 139   < > 142 142 144 145 146*  K 4.4   < > 4.0 4.4 4.4 4.5 4.9  CL 105   < > 100 102 105 108 108  CO2 21*   < > 27 27 25 25  17*  GLUCOSE 143*   < > 131* 167* 197* 177* 199*  BUN 43*   < > 51* 59* 72* 77* 103*  CREATININE 1.26*   < > 1.42* 1.53* 1.59* 1.53* 1.89*  CALCIUM 8.8*   < > 8.9 8.9 9.2 9.3 9.5  AST 126*  --  49* 53* 22 21  --   ALT 245*  --  171* 168* 99* 84*  --   ALKPHOS 63  --  68 64 63 68  --   BILITOT 0.8  --  1.0 1.0 1.0 1.1  --    < > = values in this interval not displayed.   ------------------------------------------------------------------------------------------------------------------ No results for input(s): CHOL, HDL, LDLCALC, TRIG, CHOLHDL, LDLDIRECT in the last 72 hours.  Lab Results  Component Value Date   HGBA1C 7.0 (H) 02/04/2020   ------------------------------------------------------------------------------------------------------------------ No results for input(s): TSH, T4TOTAL, T3FREE, THYROIDAB in the last 72 hours.  Invalid input(s): FREET3 ------------------------------------------------------------------------------------------------------------------ Recent Labs    03/06/20 1746 03/07/20 0500  FERRITIN 380* 346*    Coagulation profile No  results for input(s): INR, PROTIME in the last 168 hours.  Recent Labs    03/06/20 1746 03/07/20 0500  DDIMER 0.57* 0.44    Cardiac Enzymes No results for input(s): CKMB, TROPONINI, MYOGLOBIN in the last 168 hours.  Invalid input(s): CK ------------------------------------------------------------------------------------------------------------------    Component Value Date/Time   BNP 122.6 (H) 02/27/2020 0536    Micro Results Recent Results (from the past 240 hour(s))  MRSA PCR Screening  Status: None   Collection Time: 03/03/20  5:57 AM   Specimen: Nasal Mucosa; Nasopharyngeal  Result Value Ref Range Status   MRSA by PCR NEGATIVE NEGATIVE Final    Comment:        The GeneXpert MRSA Assay (FDA approved for NASAL specimens only), is one component of a comprehensive MRSA colonization surveillance program. It is not intended to diagnose MRSA infection nor to guide or monitor treatment for MRSA infections. Performed at [facility], [place] [address] [place], [place] [address]     Radiology Reports CT ABDOMEN PELVIS WO CONTRAST  Result Date: 03/08/2020 CLINICAL DATA:  Abdominal pain and leukocytosis. COVID-19 positive. EXAM: CT CHEST, ABDOMEN AND PELVIS WITHOUT CONTRAST TECHNIQUE: Multidetector CT imaging of the chest, abdomen and pelvis was performed following the standard protocol without IV contrast. COMPARISON:  None. FINDINGS: CT CHEST FINDINGS Cardiovascular: The heart is normal in size. No pericardial effusion. There is moderate tortuosity, mild ectasia and moderate atherosclerotic calcification involving the thoracic aorta. Three-vessel coronary artery calcifications are noted. Mediastinum/Nodes: Small scattered mediastinal and hilar lymph nodes but no mass or overt adenopathy. The esophagus is grossly normal. Lungs/Pleura: Patchy largely peripheral ground-glass type infiltrates throughout both lungs consistent with atypical/viral pneumonia such as COVID  pneumonia. There is also dense airspace consolidation in the left lower lobe which may be a secondary bacterial pneumonia or possible aspiration. No pleural effusion. No worrisome pulmonary lesions. The central tracheobronchial tree is unremarkable. Musculoskeletal: No significant bony findings. CT ABDOMEN PELVIS FINDINGS Hepatobiliary: No hepatic lesions are identified without contrast. There is a gallstone noted in the gallbladder but no CT findings to suggest acute cholecystitis. Normal caliber common bile duct. Pancreas: No mass, inflammation or ductal dilatation. Spleen: Normal size. No focal lesions. Adrenals/Urinary Tract: The adrenal glands are unremarkable. Left renal cysts but no worrisome renal lesions or renal calculi. No hydronephrosis. The bladder is unremarkable. Stomach/Bowel: The stomach, duodenum, small bowel and colon are unremarkable. No acute inflammatory process, mass lesions or obstructive findings. Fairly significantly distended rectum with contrast stool and air. Vascular/Lymphatic: Atherosclerotic calcifications involving the aorta iliac arteries but no aneurysm. There is a partially calcified mesenteric lesion. I do not see a discrete solid nodule but carcinoid tumor would certainly be a pelvis ability. Is also possible this is an old focus of mesenteric infarct or other prior mesenteric inflammatory process. Recommend clinical correlation. Reproductive: The prostate gland and seminal vesicles are unremarkable. There is mild median lobe hypertrophy of the prostate gland with mild impression on the base of the bladder. Other: No pelvic mass or pelvic adenopathy. No free pelvic fluid collections. Musculoskeletal: No significant bony findings. L4-5 lumbar fusion changes are noted. IMPRESSION: 1. Changes consistent with atypical/viral pneumonia such as COVID pneumonia. 2. Dense airspace consolidation in the left lower lobe could be a secondary bacterial pneumonia or possible aspiration. 3. No  acute abdominal/pelvic findings or adenopathy. 4. Partially calcified mesenteric lesion, possibly a benign process such as mesenteric infarct or other remote mesenteric inflammatory process. A carcinoid tumor would be another possibility. 5. Cholelithiasis. 6. Aortic atherosclerosis. Aortic Atherosclerosis (ICD10-I70.0). Electronically Signed   By: Marijo Sanes M.D.   On: 03/08/2020 12:41   CT CHEST WO CONTRAST  Result Date: 03/08/2020 CLINICAL DATA:  Abdominal pain and leukocytosis. COVID-19 positive. EXAM: CT CHEST, ABDOMEN AND PELVIS WITHOUT CONTRAST TECHNIQUE: Multidetector CT imaging of the chest, abdomen and pelvis was performed following the standard protocol without IV contrast. COMPARISON:  None. FINDINGS: CT CHEST FINDINGS Cardiovascular: The  heart is normal in size. No pericardial effusion. There is moderate tortuosity, mild ectasia and moderate atherosclerotic calcification involving the thoracic aorta. Three-vessel coronary artery calcifications are noted. Mediastinum/Nodes: Small scattered mediastinal and hilar lymph nodes but no mass or overt adenopathy. The esophagus is grossly normal. Lungs/Pleura: Patchy largely peripheral ground-glass type infiltrates throughout both lungs consistent with atypical/viral pneumonia such as COVID pneumonia. There is also dense airspace consolidation in the left lower lobe which may be a secondary bacterial pneumonia or possible aspiration. No pleural effusion. No worrisome pulmonary lesions. The central tracheobronchial tree is unremarkable. Musculoskeletal: No significant bony findings. CT ABDOMEN PELVIS FINDINGS Hepatobiliary: No hepatic lesions are identified without contrast. There is a gallstone noted in the gallbladder but no CT findings to suggest acute cholecystitis. Normal caliber common bile duct. Pancreas: No mass, inflammation or ductal dilatation. Spleen: Normal size. No focal lesions. Adrenals/Urinary Tract: The adrenal glands are unremarkable.  Left renal cysts but no worrisome renal lesions or renal calculi. No hydronephrosis. The bladder is unremarkable. Stomach/Bowel: The stomach, duodenum, small bowel and colon are unremarkable. No acute inflammatory process, mass lesions or obstructive findings. Fairly significantly distended rectum with contrast stool and air. Vascular/Lymphatic: Atherosclerotic calcifications involving the aorta iliac arteries but no aneurysm. There is a partially calcified mesenteric lesion. I do not see a discrete solid nodule but carcinoid tumor would certainly be a pelvis ability. Is also possible this is an old focus of mesenteric infarct or other prior mesenteric inflammatory process. Recommend clinical correlation. Reproductive: The prostate gland and seminal vesicles are unremarkable. There is mild median lobe hypertrophy of the prostate gland with mild impression on the base of the bladder. Other: No pelvic mass or pelvic adenopathy. No free pelvic fluid collections. Musculoskeletal: No significant bony findings. L4-5 lumbar fusion changes are noted. IMPRESSION: 1. Changes consistent with atypical/viral pneumonia such as COVID pneumonia. 2. Dense airspace consolidation in the left lower lobe could be a secondary bacterial pneumonia or possible aspiration. 3. No acute abdominal/pelvic findings or adenopathy. 4. Partially calcified mesenteric lesion, possibly a benign process such as mesenteric infarct or other remote mesenteric inflammatory process. A carcinoid tumor would be another possibility. 5. Cholelithiasis. 6. Aortic atherosclerosis. Aortic Atherosclerosis (ICD10-I70.0). Electronically Signed   By: Marijo Sanes M.D.   On: 03/08/2020 12:41   US RENAL  Result Date: 02/28/2020 CLINICAL DATA:  Acute renal failure EXAM: RENAL / URINARY TRACT ULTRASOUND COMPLETE COMPARISON:  None. FINDINGS: Right Kidney: Renal measurements: 9.2 x 5.2 x 5.0 cm = volume: 125 mL. Renal cortical echogenicity is diffusely increased  suggesting changes of underlying medical renal disease. There is mild diffuse renal cortical atrophy. No hydronephrosis. No intrarenal masses or calcifications. Left Kidney: Renal measurements: 9.1 x 5.2 x 4.5 cm = volume: 106 mL. Renal cortical echogenicity is diffusely increased suggesting changes of underlying medical renal disease. There is mild diffuse renal cortical atrophy. No hydronephrosis. No intrarenal masses or calcifications. 2 cm simple cortical cyst arises exophytically from the lower pole. Bladder: Is decompressed and is not well visualized. Other: None. IMPRESSION: 1. Increased cortical echogenicity of both kidneys suggesting changes of underlying medical renal disease. 2. Mild diffuse renal cortical atrophy. Electronically Signed   By: Fidela Salisbury MD   On: 02/28/2020 16:11   DG Chest Port 1 View  Result Date: 03/08/2020 CLINICAL DATA:  COVID EXAM: PORTABLE CHEST 1 VIEW COMPARISON:  03/04/2020 FINDINGS: Bilateral lower lobe airspace opacities are again noted, unchanged. Diffuse interstitial prominence. Heart is normal size. No effusions or  pneumothorax. IMPRESSION: Interstitial prominence with bibasilar airspace opacities most compatible with pneumonia. No change since prior study. Electronically Signed   By: Rolm Baptise M.D.   On: 03/08/2020 08:45   DG Chest Port 1 View  Result Date: 03/03/2020 CLINICAL DATA:  Shortness of breath.  COVID. EXAM: PORTABLE CHEST 1 VIEW COMPARISON:  02/29/2020. FINDINGS: Mediastinum hilar structures are unremarkable. Heart size normal. Low lung volumes. Diffuse prominent bilateral interstitial infiltrates again noted. Similar findings noted on prior exam. No prominent pleural effusion. No pneumothorax. Degenerative change thoracic spine. IMPRESSION: Low lung volumes. Diffuse prominent bilateral interstitial filtrates again noted. Similar findings noted on prior exam. Electronically Signed   By: [place]   On: 03/03/2020 07:04   DG Chest Port 1  View  Result Date: 02/29/2020 CLINICAL DATA:  Shortness of breath, COVID-19. EXAM: PORTABLE CHEST 1 VIEW COMPARISON:  February 25, 2020. FINDINGS: Stable cardiomediastinal silhouette. No pneumothorax or pleural effusion is noted. Stable bilateral lung opacities are noted concerning for multifocal pneumonia due to COVID-19. Bony thorax is unremarkable. IMPRESSION: Stable bilateral lung opacities are noted concerning for multifocal pneumonia due to COVID-19. Electronically Signed   By: Marijo Conception M.D.   On: 02/29/2020 08:13   DG Chest Port 1 View  Result Date: 02/24/2020 CLINICAL DATA:  COVID shortness of breath EXAM: PORTABLE CHEST 1 VIEW COMPARISON:  January 14, 2014. FINDINGS: Trachea midline. Cardiomediastinal contours are stable. Interval development of patchy basilar and mid lung predominant opacities in the LEFT and RIGHT chest both interstitial and airspace component. Process more pronounced at the LEFT lung base. Visualized skeletal structures on limited assessment without acute process. No sign of pleural effusion. IMPRESSION: Patchy basilar and mid lung predominant opacities in the LEFT and RIGHT chest both interstitial and airspace component, findings could be seen in the setting of viral or atypical pneumonia, including COVID-19 infection. Electronically Signed   By: Zetta Bills M.D.   On: 03/15/2020 08:12   DG Chest Port 1V same Day  Result Date: 03/04/2020 CLINICAL DATA:  COVID-19 positivity with hypoxia EXAM: PORTABLE CHEST 1 VIEW COMPARISON:  03/03/2020 FINDINGS: Cardiac shadow is stable. Bibasilar opacities are noted consistent with the given clinical history. The overall appearance is slightly improved when compared with the prior study. No bony abnormality is noted. IMPRESSION: Slight improvement in bilateral airspace disease when compared with the prior study. Electronically Signed   By: Inez Catalina M.D.   On: 03/04/2020 08:09   DG Abd Portable 1V  Result Date:  03/08/2020 CLINICAL DATA:  COVID 19 infection, history of diabetes EXAM: PORTABLE ABDOMEN - 1 VIEW COMPARISON:  Chest x-ray of the same date. FINDINGS: Bowel gas pattern is unremarkable. LEFT flank is however excluded from view. Stool and gas in the RIGHT lower quadrant. No abnormal calcifications.  No sign of organomegaly. Signs of L4-5 spinal fusion. No acute skeletal process on limited assessment. IMPRESSION: No signs of bowel obstruction or acute process on supine radiograph. Electronically Signed   By: Zetta Bills M.D.   On: 03/08/2020 08:47

## 2020-03-08 NOTE — Progress Notes (Signed)
Pharmacy Antibiotic Note  Brandon Santos is a 86 y.o. male admitted on 02/29/2020 with pneumonia.  Pharmacy has been consulted for unasyn dosing.  Pt has been here for COVID. His oxygenation has worsened. Unasyn has been ordered to cover PNA. Abd xray neg. CXR showed no effusion but suggested PNA.   Scr trended up to 1.89>CrCl around 30 ml/min  Plan: Unasyn 3g IV q8 F/u with renal function  Height: 5\' 6"  (167.6 cm) Weight: 85 kg (187 lb 6.3 oz) IBW/kg (Calculated) : 63.8  Temp (24hrs), Avg:97.7 F (36.5 C), Min:96.7 F (35.9 C), Max:98.4 F (36.9 C)  Recent Labs  Lab 03/04/20 1206 03/05/20 0300 03/06/20 1746 03/07/20 0500 03/08/20 0549  WBC 21.0* 20.2* 23.4* 24.2* 49.1*  CREATININE 1.42* 1.53* 1.59* 1.53* 1.89*    Estimated Creatinine Clearance: 30.3 mL/min (A) (by C-G formula based on SCr of 1.89 mg/dL (H)).    No Known Allergies  Antimicrobials this admission: 9/2 remdesivir>9/6 9/14 unasyn>>  Dose adjustments this admission:   Microbiology results: 9/14 urine>> MRSA neg  Onnie Boer, PharmD, BCIDP, AAHIVP, CPP Infectious Disease Pharmacist 03/08/2020 1:17 PM

## 2020-03-08 NOTE — Progress Notes (Signed)
Physical Therapy Treatment Patient Details Name: Brandon Santos MRN: [id number] DOB: 07/20/1933 Today's Date: 03/08/2020    History of Present Illness Pt is an 86 y.o. male admitted 03/06/2020 after 9-10 days of not feeling well with increase SOB; workup for acute hypoxic respiratory failure secondary to COVID-19 PNA requiring NRB. PMH includes DM, HLD, HTN, lumbar fusion.   PT Comments    Pt not progressing with mobility; limited by increased fatigue/lethargy this session. Mod-maxA for bed mobility, tolerated a few minutes sitting EOB with assist before needing to return to supine. SpO2 71-91% on 15L O2 HFNC + 15L NRB. Pt declining any additional mobility attempts, stating, "I'm too tired... I can't," then keeping eyes closed rest of session.     Follow Up Recommendations  SNF;Supervision/Assistance - 24 hour     Equipment Recommendations   (defer)    Recommendations for Other Services       Precautions / Restrictions Precautions Precautions: Fall;Other (comment) Precaution Comments: watch O2 (HFNC + NRB) Restrictions Weight Bearing Restrictions: No    Mobility  Bed Mobility Overal bed mobility: Needs Assistance Bed Mobility: Supine to Sit     Supine to sit: Mod assist;Max assist;HOB elevated Sit to supine: Min assist   General bed mobility comments: Pt agreeable to sit up but then not initiating movement, modA to initiate BLE movement, maxA for trunk elevation; suspect more related to fatigue than actual muscle weakness. Initiating return to supine well with minA to manage BLEs. TotalA for repositioning/sliding up  Transfers                 General transfer comment: Deferred secondary to fatigue/lethargy and lack of command following while seated EOB  Ambulation/Gait                 Stairs             Wheelchair Mobility    Modified Rankin (Stroke Patients Only)       Balance Overall balance assessment: Needs assistance Sitting-balance  support: Feet supported;No upper extremity supported Sitting balance-Leahy Scale: Poor Sitting balance - Comments: Brief periods of sitting balance without assist, but reliant on UE support, up to maxA for static sitting balance; pt restless and reports fatigue                                    Cognition Arousal/Alertness: Lethargic Behavior During Therapy: WFL for tasks assessed/performed;Flat affect Overall Cognitive Status: No family/caregiver present to determine baseline cognitive functioning Area of Impairment: Attention;Following commands;Problem solving                   Current Attention Level: Focused;Sustained   Following Commands: Follows one step commands inconsistently;Follows one step commands with increased time     Problem Solving: Slow processing;Decreased initiation;Requires verbal cues;Difficulty sequencing General Comments: Increased fatigue/lethargy this session; initially with eyes open and conversant, becoming less interactive throughout session with increasing fatigue. "I can't... I'm too tired"      Exercises      General Comments General comments (skin integrity, edema, etc.): Resting SpO2 88-91% on 15(+)L HFNC + 15(+)L NRB, HR 83, BP 147/72, RR 30s. SpO2 down to 71% sitting EOB (unreliable pleth) returning to low 80s with stating sitting; return to 87% once supine. Pt required maxA and encouragement to continue drinking contrast for upcoming abdominal CT (RN/NT notified he still hadn't drank it all)  Pertinent Vitals/Pain Pain Assessment: Faces Faces Pain Scale: Hurts little more Pain Location: Generalized Pain Descriptors / Indicators: Tiring Pain Intervention(s): Monitored during session;Limited activity within patient's tolerance    Home Living                      Prior Function            PT Goals (current goals can now be found in the care plan section) Progress towards PT goals: Not progressing toward  goals - comment (limited by fatigue/lethargy/DOE)    Frequency    Min 2X/week      PT Plan Frequency needs to be updated    Co-evaluation              AM-PAC PT "6 Clicks" Mobility   Outcome Measure  Help needed turning from your back to your side while in a flat bed without using bedrails?: A Lot Help needed moving from lying on your back to sitting on the side of a flat bed without using bedrails?: A Lot Help needed moving to and from a bed to a chair (including a wheelchair)?: A Lot Help needed standing up from a chair using your arms (e.g., wheelchair or bedside chair)?: A Lot Help needed to walk in hospital room?: Total Help needed climbing 3-5 steps with a railing? : Total 6 Click Score: 10    End of Session Equipment Utilized During Treatment: Oxygen Activity Tolerance: Patient limited by fatigue;Treatment limited secondary to medical complications (Comment) Patient left: in bed;with call bell/phone within reach;with bed alarm set Nurse Communication: Mobility status PT Visit Diagnosis: Unsteadiness on feet (R26.81);Difficulty in walking, not elsewhere classified (R26.2);Muscle weakness (generalized) (M62.81)     Time: 3662-9476 PT Time Calculation (min) (ACUTE ONLY): 21 min  Charges:  $Therapeutic Activity: 8-22 mins                    Mabeline Caras, PT, DPT Acute Rehabilitation Services  Pager [phone] Office [phone]  Derry Lory 03/08/2020, 12:40 PM

## 2020-03-08 NOTE — Progress Notes (Signed)
OT Cancellation Note  Patient Details Name: Ashutosh Dieguez MRN: [id number] DOB: 1934-06-22   Cancelled Treatment:    Reason Eval/Treat Not Completed: Medical issues which prohibited therapy;Patient not medically ready (Planning for transfer to ICU for intubation. ) Will hold and return as pt medically ready.  [place], OTR/L Acute Rehab Pager: [phone] Office: [phone] 03/08/2020, 3:28 PM

## 2020-03-08 NOTE — Procedures (Signed)
Intubation Procedure Note  Brandon Santos  [id number]  06-28-1933  Date:03/08/20  Time:5:33 PM   Provider Performing:Brandon Santos Brandon Santos    Procedure: Intubation (31500)  Indication(s) Respiratory Failure  Consent Risks of the procedure as well as the alternatives and risks of each were explained to the patient and/or caregiver.  Consent for the procedure was obtained and is signed in the bedside chart   Anesthesia Etomidate, Versed and Fentanyl   Time Out Verified patient identification, verified procedure, site/side was marked, verified correct patient position, special equipment/implants available, medications/allergies/relevant history reviewed, required imaging and test results available.   Sterile Technique Usual hand hygeine, masks, and gloves were used   Procedure Description Patient positioned in bed supine.  Sedation given as noted above.  Patient was intubated with endotracheal tube using Glidescope.  View was Grade 1 full glottis .  Number of attempts was 1.  Colorimetric CO2 detector was consistent with tracheal placement.   Complications/Tolerance None; patient tolerated the procedure well. Chest X-ray is ordered to verify placement.   EBL none   Specimen(s) None

## 2020-03-08 NOTE — Progress Notes (Signed)
  Speech Language Pathology Treatment: Dysphagia  Patient Details Name: Brandon Santos MRN: [id number] DOB: 1934-01-22 Today's Date: 03/08/2020 Time: 3235-5732 SLP Time Calculation (min) (ACUTE ONLY): 8 min  Assessment / Plan / Recommendation Clinical Impression  Pt on Speech therapy caseload however re-assessment ordered for pt's worsening respiratory status. Compared to 9/10 his subjective work of breathing appeared less effortful although his O2 liters now 15. He is pale today and did not want to eat/drink saying "I want to sleep" although agreeable to one sip thin water via straw and one nectar thick. There was no difference from oral or pharyngeal subjective observation. He politely refused further. ST removed dried food from lips. Dr. Waldron Labs arrived reporting that critical care team assessment pt and plans are to transfer to ICU due to finding of acute sepsis and is now NPO. Therapist will follow for status. Oral care as pt tolerates.    HPI HPI: 86 yo male not feeling well for 9-10 days with increasing SOB. Found to be covid positive. Quickly deteriorated and required 15LPM per NRB. PMH DM, HLD, HTN, lumbar fusion. Pt confused yesterday, initiated HFNC and choked on a pill with water. Today, RN reported he is less confused and did well with pill whole in applesauce today. CXR Slight improvement in bilateral airspace disease when compared with prior exam      SLP Plan  Continue with current plan of care       Recommendations  Diet recommendations: Dysphagia 1 (puree);Nectar-thick liquid Liquids provided via: Cup;Straw Medication Administration: Crushed with puree Supervision: Staff to assist with self feeding;Full supervision/cueing for compensatory strategies Compensations: Minimize environmental distractions;Slow rate;Small sips/bites Postural Changes and/or Swallow Maneuvers: Seated upright 90 degrees;Upright 30-60 min after meal                Oral Care Recommendations:  Oral care BID Follow up Recommendations:  (TBD) SLP Visit Diagnosis: Dysphagia, unspecified (R13.10) Plan: Continue with current plan of care       GO                [name] 03/08/2020, 2:31 PM  [name] Tige Meas M.Ed Risk analyst [phone] Office [phone]

## 2020-03-09 ENCOUNTER — Inpatient Hospital Stay (HOSPITAL_COMMUNITY): Payer: BC Managed Care – PPO

## 2020-03-09 DIAGNOSIS — E875 Hyperkalemia: Secondary | ICD-10-CM

## 2020-03-09 DIAGNOSIS — N179 Acute kidney failure, unspecified: Secondary | ICD-10-CM

## 2020-03-09 DIAGNOSIS — Z9911 Dependence on respirator [ventilator] status: Secondary | ICD-10-CM

## 2020-03-09 DIAGNOSIS — J80 Acute respiratory distress syndrome: Secondary | ICD-10-CM

## 2020-03-09 LAB — POCT I-STAT 7, (LYTES, BLD GAS, ICA,H+H)
Acid-base deficit: 7 mmol/L — ABNORMAL HIGH (ref 0.0–2.0)
Acid-base deficit: 6 mmol/L — ABNORMAL HIGH (ref 0.0–2.0)
Acid-base deficit: 5 mmol/L — ABNORMAL HIGH (ref 0.0–2.0)
Acid-base deficit: 4 mmol/L — ABNORMAL HIGH (ref 0.0–2.0)
Bicarbonate: 23.6 mmol/L (ref 20.0–28.0)
Bicarbonate: 23.7 mmol/L (ref 20.0–28.0)
Bicarbonate: 23.1 mmol/L (ref 20.0–28.0)
Bicarbonate: 24.8 mmol/L (ref 20.0–28.0)
Calcium, Ion: 1.09 mmol/L — ABNORMAL LOW (ref 1.15–1.40)
Calcium, Ion: 1.17 mmol/L (ref 1.15–1.40)
Calcium, Ion: 1.03 mmol/L — ABNORMAL LOW (ref 1.15–1.40)
Calcium, Ion: 0.98 mmol/L — ABNORMAL LOW (ref 1.15–1.40)
HCT: 46 % (ref 39.0–52.0)
HCT: 43 % (ref 39.0–52.0)
HCT: 42 % (ref 39.0–52.0)
HCT: 40 % (ref 39.0–52.0)
Hemoglobin: 15.6 g/dL (ref 13.0–17.0)
Hemoglobin: 14.6 g/dL (ref 13.0–17.0)
Hemoglobin: 14.3 g/dL (ref 13.0–17.0)
Hemoglobin: 13.6 g/dL (ref 13.0–17.0)
O2 Saturation: 80 %
O2 Saturation: 71 %
O2 Saturation: 79 %
O2 Saturation: 93 %
Patient temperature: 96.8
Patient temperature: 96.8
Patient temperature: 97.7
Patient temperature: 97.8
Potassium: 6.9 mmol/L (ref 3.5–5.1)
Potassium: 5.8 mmol/L — ABNORMAL HIGH (ref 3.5–5.1)
Potassium: 7.2 mmol/L (ref 3.5–5.1)
Potassium: 7.1 mmol/L (ref 3.5–5.1)
Sodium: 140 mmol/L (ref 135–145)
Sodium: 143 mmol/L (ref 135–145)
Sodium: 139 mmol/L (ref 135–145)
Sodium: 140 mmol/L (ref 135–145)
TCO2: 26 mmol/L (ref 22–32)
TCO2: 26 mmol/L (ref 22–32)
TCO2: 25 mmol/L (ref 22–32)
TCO2: 27 mmol/L (ref 22–32)
pCO2 arterial: 68.7 mmHg (ref 32.0–48.0)
pCO2 arterial: 62.7 mmHg — ABNORMAL HIGH (ref 32.0–48.0)
pCO2 arterial: 54.4 mmHg — ABNORMAL HIGH (ref 32.0–48.0)
pCO2 arterial: 60.5 mmHg — ABNORMAL HIGH (ref 32.0–48.0)
pH, Arterial: 7.138 — CL (ref 7.350–7.450)
pH, Arterial: 7.18 — CL (ref 7.350–7.450)
pH, Arterial: 7.233 — ABNORMAL LOW (ref 7.350–7.450)
pH, Arterial: 7.219 — ABNORMAL LOW (ref 7.350–7.450)
pO2, Arterial: 56 mmHg — ABNORMAL LOW (ref 83.0–108.0)
pO2, Arterial: 45 mmHg — ABNORMAL LOW (ref 83.0–108.0)
pO2, Arterial: 51 mmHg — ABNORMAL LOW (ref 83.0–108.0)
pO2, Arterial: 80 mmHg — ABNORMAL LOW (ref 83.0–108.0)

## 2020-03-09 LAB — GLUCOSE, CAPILLARY
Glucose-Capillary: 127 mg/dL — ABNORMAL HIGH (ref 70–99)
Glucose-Capillary: 173 mg/dL — ABNORMAL HIGH (ref 70–99)
Glucose-Capillary: 191 mg/dL — ABNORMAL HIGH (ref 70–99)
Glucose-Capillary: 195 mg/dL — ABNORMAL HIGH (ref 70–99)
Glucose-Capillary: 197 mg/dL — ABNORMAL HIGH (ref 70–99)
Glucose-Capillary: 215 mg/dL — ABNORMAL HIGH (ref 70–99)
Glucose-Capillary: 221 mg/dL — ABNORMAL HIGH (ref 70–99)

## 2020-03-09 LAB — COMPREHENSIVE METABOLIC PANEL
ALT: 58 U/L — ABNORMAL HIGH (ref 0–44)
AST: 31 U/L (ref 15–41)
Albumin: 2.2 g/dL — ABNORMAL LOW (ref 3.5–5.0)
Alkaline Phosphatase: 88 U/L (ref 38–126)
Anion gap: 18 — ABNORMAL HIGH (ref 5–15)
BUN: 120 mg/dL — ABNORMAL HIGH (ref 8–23)
CO2: 21 mmol/L — ABNORMAL LOW (ref 22–32)
Calcium: 7.6 mg/dL — ABNORMAL LOW (ref 8.9–10.3)
Chloride: 105 mmol/L (ref 98–111)
Creatinine, Ser: 2.89 mg/dL — ABNORMAL HIGH (ref 0.61–1.24)
GFR calc Af Amer: 22 mL/min — ABNORMAL LOW (ref >60)
GFR calc non Af Amer: 19 mL/min — ABNORMAL LOW (ref >60)
Glucose, Bld: 245 mg/dL — ABNORMAL HIGH (ref 70–99)
Potassium: 7.1 mmol/L (ref 3.5–5.1)
Sodium: 144 mmol/L (ref 135–145)
Total Bilirubin: 1.1 mg/dL (ref 0.3–1.2)
Total Protein: 4.9 g/dL — ABNORMAL LOW (ref 6.5–8.1)

## 2020-03-09 LAB — RENAL FUNCTION PANEL
Albumin: 2 g/dL — ABNORMAL LOW (ref 3.5–5.0)
Anion gap: 17 — ABNORMAL HIGH (ref 5–15)
BUN: 124 mg/dL — ABNORMAL HIGH (ref 8–23)
CO2: 21 mmol/L — ABNORMAL LOW (ref 22–32)
Calcium: 7.6 mg/dL — ABNORMAL LOW (ref 8.9–10.3)
Chloride: 105 mmol/L (ref 98–111)
Creatinine, Ser: 3.65 mg/dL — ABNORMAL HIGH (ref 0.61–1.24)
GFR calc Af Amer: 17 mL/min — ABNORMAL LOW (ref >60)
GFR calc non Af Amer: 14 mL/min — ABNORMAL LOW (ref >60)
Glucose, Bld: 269 mg/dL — ABNORMAL HIGH (ref 70–99)
Phosphorus: >30 mg/dL — ABNORMAL HIGH (ref 2.5–4.6)
Potassium: 6.8 mmol/L (ref 3.5–5.1)
Sodium: 143 mmol/L (ref 135–145)

## 2020-03-09 LAB — MAGNESIUM: Magnesium: 2.9 mg/dL — ABNORMAL HIGH (ref 1.7–2.4)

## 2020-03-09 LAB — COOXEMETRY PANEL
Carboxyhemoglobin: 0.3 % — ABNORMAL LOW (ref 0.5–1.5)
Methemoglobin: 0.9 % (ref 0.0–1.5)
O2 Saturation: 59.5 %
Total hemoglobin: 14.1 g/dL (ref 12.0–16.0)

## 2020-03-09 LAB — CBC
HCT: 47.9 % (ref 39.0–52.0)
Hemoglobin: 14.8 g/dL (ref 13.0–17.0)
MCH: 28.8 pg (ref 26.0–34.0)
MCHC: 30.9 g/dL (ref 30.0–36.0)
MCV: 93.4 fL (ref 80.0–100.0)
Platelets: 207 10*3/uL (ref 150–400)
RBC: 5.13 MIL/uL (ref 4.22–5.81)
RDW: 13.9 % (ref 11.5–15.5)
WBC: 46.8 10*3/uL — ABNORMAL HIGH (ref 4.0–10.5)
nRBC: 0 % (ref 0.0–0.2)

## 2020-03-09 LAB — PROCALCITONIN: Procalcitonin: 0.86 ng/mL

## 2020-03-09 LAB — URINE CULTURE: Culture: <10000 — AB

## 2020-03-09 LAB — NA AND K (SODIUM & POTASSIUM), RAND UR
Potassium Urine: 48 mmol/L
Sodium, Ur: 46 mmol/L

## 2020-03-09 LAB — POTASSIUM
Potassium: 6 mmol/L — ABNORMAL HIGH (ref 3.5–5.1)
Potassium: 6.3 mmol/L (ref 3.5–5.1)
Potassium: 5.5 mmol/L — ABNORMAL HIGH (ref 3.5–5.1)
Potassium: 5.6 mmol/L — ABNORMAL HIGH (ref 3.5–5.1)

## 2020-03-09 LAB — D-DIMER, QUANTITATIVE: D-Dimer, Quant: 1.2 ug/mL-FEU — ABNORMAL HIGH (ref 0.00–0.50)

## 2020-03-09 LAB — LACTIC ACID, PLASMA
Lactic Acid, Venous: 6 mmol/L (ref 0.5–1.9)
Lactic Acid, Venous: 6.4 mmol/L (ref 0.5–1.9)

## 2020-03-09 LAB — GLUCOSE, RANDOM: Glucose, Bld: 201 mg/dL — ABNORMAL HIGH (ref 70–99)

## 2020-03-09 MED ORDER — VITAL 1.5 CAL PO LIQD
1000.0000 mL | ORAL | Status: DC
  Administered 2020-03-09 – 2020-03-11 (×3): 1000 mL
  Filled 2020-03-09 (×2): qty 1000

## 2020-03-09 MED ORDER — LACTATED RINGERS IV BOLUS
500.0000 mL | Freq: Once | INTRAVENOUS | Status: AC
  Administered 2020-03-09: 500 mL via INTRAVENOUS

## 2020-03-09 MED ORDER — VECURONIUM BROMIDE 10 MG IV SOLR: 0.0800 mg/kg | INTRAVENOUS | Status: DC | PRN

## 2020-03-09 MED ORDER — PHENYLEPHRINE CONCENTRATED 100MG/250ML (0.4 MG/ML) INFUSION SIMPLE
0.0000 ug/min | INTRAVENOUS | Status: DC
  Administered 2020-03-09: 200 ug/min via INTRAVENOUS
  Administered 2020-03-10 – 2020-03-11 (×8): 400 ug/min via INTRAVENOUS
  Filled 2020-03-09 (×12): qty 250

## 2020-03-09 MED ORDER — SODIUM ZIRCONIUM CYCLOSILICATE 10 G PO PACK
10.0000 g | PACK | Freq: Three times a day (TID) | ORAL | Status: DC
  Administered 2020-03-09 (×3): 10 g
  Filled 2020-03-09 (×3): qty 1

## 2020-03-09 MED ORDER — ARTIFICIAL TEARS OPHTHALMIC OINT
1.0000 "application " | TOPICAL_OINTMENT | Freq: Three times a day (TID) | OPHTHALMIC | Status: DC
  Administered 2020-03-09 – 2020-03-11 (×6): 1 via OPHTHALMIC
  Filled 2020-03-09: qty 3.5

## 2020-03-09 MED ORDER — SODIUM BICARBONATE 8.4 % IV SOLN
50.0000 meq | Freq: Once | INTRAVENOUS | Status: AC
  Administered 2020-03-09: 50 meq via INTRAVENOUS
  Filled 2020-03-09: qty 50

## 2020-03-09 MED ORDER — ORAL CARE MOUTH RINSE
15.0000 mL | OROMUCOSAL | Status: DC
  Administered 2020-03-09 – 2020-03-11 (×21): 15 mL via OROMUCOSAL

## 2020-03-09 MED ORDER — STERILE WATER FOR INJECTION IV SOLN
INTRAVENOUS | Status: DC
  Filled 2020-03-09: qty 850

## 2020-03-09 MED ORDER — POLYETHYLENE GLYCOL 3350 17 G PO PACK
17.0000 g | PACK | Freq: Every day | ORAL | Status: DC
  Administered 2020-03-09 – 2020-03-10 (×2): 17 g
  Filled 2020-03-09: qty 1

## 2020-03-09 MED ORDER — SODIUM BICARBONATE 8.4 % IV SOLN
INTRAVENOUS | Status: AC
  Filled 2020-03-09: qty 50

## 2020-03-09 MED ORDER — CALCIUM GLUCONATE 10 % IV SOLN
1.0000 g | Freq: Once | INTRAVENOUS | Status: AC
  Administered 2020-03-09: 1 g via INTRAVENOUS
  Filled 2020-03-09: qty 10

## 2020-03-09 MED ORDER — ALBUMIN HUMAN 25 % IV SOLN
12.5000 g | Freq: Once | INTRAVENOUS | Status: AC
  Administered 2020-03-09: 12.5 g via INTRAVENOUS
  Filled 2020-03-09: qty 50

## 2020-03-09 MED ORDER — STERILE WATER FOR INJECTION IV SOLN
INTRAVENOUS | Status: DC
  Filled 2020-03-09 (×2): qty 850

## 2020-03-09 MED ORDER — PHENYLEPHRINE HCL-NACL 10-0.9 MG/250ML-% IV SOLN
0.0000 ug/min | INTRAVENOUS | Status: DC
  Administered 2020-03-09: 50 ug/min via INTRAVENOUS
  Filled 2020-03-09 (×2): qty 250

## 2020-03-09 MED ORDER — PRISMASOL BGK 0/2.5 32-2.5 MEQ/L IV SOLN
INTRAVENOUS | Status: DC
  Filled 2020-03-09 (×14): qty 5000

## 2020-03-09 MED ORDER — SODIUM CHLORIDE 0.9% FLUSH
10.0000 mL | Freq: Two times a day (BID) | INTRAVENOUS | Status: DC
  Administered 2020-03-09 – 2020-03-10 (×2): 10 mL

## 2020-03-09 MED ORDER — SODIUM CHLORIDE 0.9 % IV BOLUS
250.0000 mL | Freq: Once | INTRAVENOUS | Status: AC
  Administered 2020-03-09: 250 mL via INTRAVENOUS

## 2020-03-09 MED ORDER — DEXTROSE 50 % IV SOLN
50.0000 mL | Freq: Once | INTRAVENOUS | Status: AC
  Administered 2020-03-09: 50 mL via INTRAVENOUS
  Filled 2020-03-09: qty 50

## 2020-03-09 MED ORDER — MIDAZOLAM BOLUS VIA INFUSION
1.0000 mg | INTRAVENOUS | Status: DC | PRN
  Filled 2020-03-09: qty 2

## 2020-03-09 MED ORDER — INSULIN ASPART 100 UNIT/ML IV SOLN
5.0000 [IU] | Freq: Once | INTRAVENOUS | Status: AC
  Administered 2020-03-09: 5 [IU] via INTRAVENOUS

## 2020-03-09 MED ORDER — DEXTROSE 50 % IV SOLN
1.0000 | Freq: Once | INTRAVENOUS | Status: AC
  Administered 2020-03-09: 50 mL via INTRAVENOUS
  Filled 2020-03-09: qty 50

## 2020-03-09 MED ORDER — ALBUTEROL SULFATE (2.5 MG/3ML) 0.083% IN NEBU
10.0000 mg | INHALATION_SOLUTION | Freq: Once | RESPIRATORY_TRACT | Status: AC
  Administered 2020-03-09: 10 mg via RESPIRATORY_TRACT
  Filled 2020-03-09: qty 12

## 2020-03-09 MED ORDER — HYDROCORTISONE NA SUCCINATE PF 100 MG IJ SOLR
50.0000 mg | Freq: Four times a day (QID) | INTRAMUSCULAR | Status: DC
  Administered 2020-03-09 – 2020-03-11 (×9): 50 mg via INTRAVENOUS
  Filled 2020-03-09 (×9): qty 2

## 2020-03-09 MED ORDER — ALBUTEROL SULFATE (2.5 MG/3ML) 0.083% IN NEBU
2.5000 mg | INHALATION_SOLUTION | RESPIRATORY_TRACT | Status: AC
  Administered 2020-03-09: 2.5 mg via RESPIRATORY_TRACT
  Filled 2020-03-09: qty 3

## 2020-03-09 MED ORDER — POLYETHYLENE GLYCOL 3350 17 G PO PACK: 17.0000 g | PACK | Freq: Every day | ORAL | Status: DC

## 2020-03-09 MED ORDER — SODIUM CHLORIDE 0.9% FLUSH: 10.0000 mL | INTRAVENOUS | Status: DC | PRN

## 2020-03-09 MED ORDER — INSULIN ASPART 100 UNIT/ML IV SOLN
10.0000 [IU] | Freq: Once | INTRAVENOUS | Status: AC
  Administered 2020-03-09: 10 [IU] via INTRAVENOUS

## 2020-03-09 MED ORDER — LINEZOLID 600 MG/300ML IV SOLN
600.0000 mg | Freq: Two times a day (BID) | INTRAVENOUS | Status: DC
  Administered 2020-03-09 – 2020-03-10 (×4): 600 mg via INTRAVENOUS
  Filled 2020-03-09 (×5): qty 300

## 2020-03-09 MED ORDER — HEPARIN SODIUM (PORCINE) 1000 UNIT/ML IJ SOLN: 3000.0000 [IU] | Freq: Once | INTRAMUSCULAR | Status: DC

## 2020-03-09 MED ORDER — B COMPLEX-C PO TABS
1.0000 | ORAL_TABLET | Freq: Every day | ORAL | Status: DC
  Administered 2020-03-09: 1
  Filled 2020-03-09: qty 1

## 2020-03-09 MED ORDER — SODIUM CHLORIDE 0.9 % IV SOLN: 1.0000 g | Freq: Once | INTRAVENOUS | Status: DC

## 2020-03-09 MED ORDER — HEPARIN SODIUM (PORCINE) 5000 UNIT/ML IJ SOLN
5000.0000 [IU] | Freq: Three times a day (TID) | INTRAMUSCULAR | Status: DC
  Administered 2020-03-09 – 2020-03-11 (×6): 5000 [IU] via SUBCUTANEOUS
  Filled 2020-03-09 (×6): qty 1

## 2020-03-09 MED ORDER — SODIUM ZIRCONIUM CYCLOSILICATE 10 G PO PACK
10.0000 g | PACK | Freq: Once | ORAL | Status: AC
  Administered 2020-03-09: 10 g
  Filled 2020-03-09: qty 1

## 2020-03-09 MED ORDER — SODIUM CHLORIDE 0.9 % IV SOLN
1.0000 g | Freq: Once | INTRAVENOUS | Status: AC
  Administered 2020-03-09: 1 g via INTRAVENOUS
  Filled 2020-03-09: qty 10

## 2020-03-09 MED ORDER — SODIUM BICARBONATE 8.4 % IV SOLN
50.0000 meq | Freq: Once | INTRAVENOUS | Status: AC
  Administered 2020-03-09: 50 meq via INTRAVENOUS

## 2020-03-09 MED ORDER — DOCUSATE SODIUM 50 MG/5ML PO LIQD: 100.0000 mg | Freq: Two times a day (BID) | ORAL | Status: DC

## 2020-03-09 MED ORDER — VASOPRESSIN 20 UNITS/100 ML INFUSION FOR SHOCK
0.0000 [IU]/min | INTRAVENOUS | Status: DC
  Administered 2020-03-09 (×2): 0.03 [IU]/min via INTRAVENOUS
  Administered 2020-03-09 – 2020-03-11 (×4): 0.04 [IU]/min via INTRAVENOUS
  Filled 2020-03-09 (×7): qty 100

## 2020-03-09 MED ORDER — INSULIN ASPART 100 UNIT/ML ~~LOC~~ SOLN: 0.0000 [IU] | SUBCUTANEOUS | Status: DC | PRN

## 2020-03-09 MED ORDER — PIPERACILLIN-TAZOBACTAM IN DEX 2-0.25 GM/50ML IV SOLN
2.2500 g | Freq: Three times a day (TID) | INTRAVENOUS | Status: DC
  Filled 2020-03-09 (×2): qty 50

## 2020-03-09 MED ORDER — PROSOURCE TF PO LIQD
45.0000 mL | Freq: Four times a day (QID) | ORAL | Status: DC
  Administered 2020-03-09 – 2020-03-10 (×6): 45 mL
  Filled 2020-03-09 (×6): qty 45

## 2020-03-09 MED ORDER — PRISMASOL BGK 0/2.5 32-2.5 MEQ/L REPLACEMENT SOLN
Status: DC
  Filled 2020-03-09 (×4): qty 5000

## 2020-03-09 MED ORDER — DOCUSATE SODIUM 50 MG/5ML PO LIQD
100.0000 mg | Freq: Two times a day (BID) | ORAL | Status: DC
  Administered 2020-03-09 – 2020-03-10 (×4): 100 mg
  Filled 2020-03-09 (×3): qty 10

## 2020-03-09 MED ORDER — MIDAZOLAM 50MG/50ML (1MG/ML) PREMIX INFUSION
0.0000 mg/h | INTRAVENOUS | Status: DC
  Administered 2020-03-11: 2 mg/h via INTRAVENOUS
  Filled 2020-03-09 (×2): qty 50

## 2020-03-09 MED ORDER — CHLORHEXIDINE GLUCONATE 0.12% ORAL RINSE (MEDLINE KIT)
15.0000 mL | Freq: Two times a day (BID) | OROMUCOSAL | Status: DC
  Administered 2020-03-09 – 2020-03-11 (×5): 15 mL via OROMUCOSAL

## 2020-03-09 MED ORDER — CALCIUM GLUCONATE 10 % IV SOLN: 1.0000 g | Freq: Once | INTRAVENOUS | Status: DC

## 2020-03-09 MED ORDER — HEPARIN SODIUM (PORCINE) 1000 UNIT/ML DIALYSIS
1000.0000 [IU] | INTRAMUSCULAR | Status: DC | PRN
  Administered 2020-03-09: 2000 [IU] via INTRAVENOUS_CENTRAL
  Administered 2020-03-11: 2400 [IU] via INTRAVENOUS_CENTRAL
  Filled 2020-03-09 (×5): qty 6

## 2020-03-09 MED ORDER — INSULIN ASPART 100 UNIT/ML ~~LOC~~ SOLN
0.0000 [IU] | SUBCUTANEOUS | Status: DC
  Administered 2020-03-09 (×2): 3 [IU] via SUBCUTANEOUS
  Administered 2020-03-09 (×2): 5 [IU] via SUBCUTANEOUS
  Administered 2020-03-10: 3 [IU] via SUBCUTANEOUS
  Administered 2020-03-10 – 2020-03-11 (×2): 2 [IU] via SUBCUTANEOUS

## 2020-03-09 MED ORDER — PIPERACILLIN-TAZOBACTAM 3.375 G IVPB
3.3750 g | Freq: Four times a day (QID) | INTRAVENOUS | Status: DC
  Administered 2020-03-09: 3.375 g via INTRAVENOUS
  Filled 2020-03-09: qty 50

## 2020-03-09 MED ORDER — PIPERACILLIN-TAZOBACTAM 3.375 G IVPB 30 MIN
3.3750 g | Freq: Four times a day (QID) | INTRAVENOUS | Status: DC
  Administered 2020-03-09 – 2020-03-11 (×7): 3.375 g via INTRAVENOUS
  Filled 2020-03-09 (×13): qty 50

## 2020-03-09 MED ORDER — ROCURONIUM BROMIDE 10 MG/ML (PF) SYRINGE
10.0000 mg | PREFILLED_SYRINGE | Freq: Once | INTRAVENOUS | Status: AC | PRN
  Administered 2020-03-09: 10 mg via INTRAVENOUS
  Filled 2020-03-09: qty 10

## 2020-03-09 MED ORDER — PRISMASOL BGK 0/2.5 32-2.5 MEQ/L REPLACEMENT SOLN
Status: DC
  Filled 2020-03-09 (×2): qty 5000

## 2020-03-09 MED ORDER — CALCIUM GLUCONATE-NACL 1-0.675 GM/50ML-% IV SOLN
1.0000 g | Freq: Once | INTRAVENOUS | Status: AC
  Administered 2020-03-09: 1000 mg via INTRAVENOUS
  Filled 2020-03-09: qty 50

## 2020-03-09 NOTE — Progress Notes (Signed)
eLink Physician-Brief Progress Note Patient Name: Brandon Santos DOB: 03-Dec-1933 MRN: [id number]   Date of Service  03/09/2020  HPI/Events of Note  MAP low.  Received yesterday IV fluids.  maxed on levo.    eICU Interventions  - get AM labs - LR 500 ml bolus - Vasopressin gtt. - discussed with RN.  Increase rate to 26.  Prone once BP stable if needed.     Intervention Category Intermediate Interventions: Hypotension - evaluation and management  Elmer Sow 03/09/2020, 1:48 AM

## 2020-03-09 NOTE — Progress Notes (Signed)
ABG and critical K results given to [facility] NP CCM . Verbal order received to hold off on proning pt at this time. No vent changes to be made at this time either. RT will continue to monitor.   Ph 7.23 PcO2 54.4 PaO2 51 HCO3 23.1 SaO2 2  K 7.2

## 2020-03-09 NOTE — Procedures (Signed)
Cortrak  Person Inserting Tube:  Brandon Santos, RD Tube Type:  Cortrak - 43 inches Tube Location:  Right nare Initial Placement:  Postpyloric Secured by: Bridle Technique Used to Measure Tube Placement:  Documented cm marking at nare/ corner of mouth Cortrak Secured At:  84 cm    Cortrak Tube Team Note:  Consult received to place a Cortrak feeding tube.   No x-ray is required. RN may begin using tube.   If the tube becomes dislodged please keep the tube and contact the Cortrak team at [email or web address] (password [email or web address]) for replacement.  If after hours and replacement cannot be delayed, place a NG tube and confirm placement with an abdominal x-ray.    Lockie Pares., RD, LDN, CNSC See AMiON for contact information

## 2020-03-09 NOTE — Consult Note (Addendum)
[facility] Renal Consultation Note  Requesting MD: Audria Nine, DO   Indication for Consultation:  AKI and hyperkalemia  Chief complaint: shortness of breath  HPI:  Brandon Santos is a 86 y.o. male with a history of hypertension, diabetes mellitus, and BPH who presented to the hospital with shortness of breath.  He was diagnosed with Covid pneumonia.  He received Actemra, Decadron, and remdesivir.  He represented to the ICU with hospital-acquired pneumonia and unfortunately his course has been complicated by intubation and multiorgan failure including acute kidney injury and ongoing hyperkalemia.  Nephrology is consulted for assistance with management of AKI and hyperkalemia.  Pulmonary does assess him as a candidate for CRRT and his son wants to pursue dialysis/RRT.   Pulmonology has discussed that this may be a last option for him to escalate care.  He has been on pressors.  Levo is at 55 mcg/min and vaso is on as of my exam.   Creat  Date/Time Value Ref Range Status  02/04/2020 09:57 AM 1.21 (H) 0.70 - 1.11 mg/dL Final    Comment:    For patients >58 years of age, the reference limit for Creatinine is approximately 13% higher for people identified as African-American. .    Creatinine, Ser  Date/Time Value Ref Range Status  03/09/2020 01:13 AM 2.89 (H) 0.61 - 1.24 mg/dL Final  03/08/2020 05:49 AM 1.89 (H) 0.61 - 1.24 mg/dL Final  03/07/2020 05:00 AM 1.53 (H) 0.61 - 1.24 mg/dL Final  03/06/2020 05:46 PM 1.59 (H) 0.61 - 1.24 mg/dL Final  03/05/2020 03:00 AM 1.53 (H) 0.61 - 1.24 mg/dL Final  03/04/2020 12:06 PM 1.42 (H) 0.61 - 1.24 mg/dL Final  03/03/2020 06:21 AM 1.26 (H) 0.61 - 1.24 mg/dL Final  03/02/2020 11:51 AM 1.36 (H) 0.61 - 1.24 mg/dL Final  03/01/2020 03:07 AM 1.32 (H) 0.61 - 1.24 mg/dL Final  02/29/2020 02:55 AM 1.47 (H) 0.61 - 1.24 mg/dL Final  02/28/2020 02:55 AM 1.66 (H) 0.61 - 1.24 mg/dL Final  02/27/2020 05:36 AM 1.60 (H) 0.61 - 1.24 mg/dL Final   02/26/2020 02:39 AM 1.48 (H) 0.61 - 1.24 mg/dL Final  03/05/2020 08:01 AM 1.85 (H) 0.61 - 1.24 mg/dL Final  01/29/2019 09:57 AM 1.15 0.40 - 1.50 mg/dL Final  05/29/2018 10:00 AM 1.30 0.40 - 1.50 mg/dL Final  11/28/2017 09:57 AM 1.18 0.40 - 1.50 mg/dL Final  05/23/2017 09:37 AM 1.24 0.40 - 1.50 mg/dL Final  11/22/2016 11:49 AM 1.28 0.40 - 1.50 mg/dL Final  05/10/2016 10:00 AM 1.38 0.40 - 1.50 mg/dL Final  12/01/2015 09:55 AM 1.16 0.40 - 1.50 mg/dL Final  11/03/2015 08:27 AM 1.59 (H) 0.40 - 1.50 mg/dL Final  04/28/2015 08:36 AM 1.32 0.40 - 1.50 mg/dL Final  10/06/2014 12:20 PM 1.32 0.40 - 1.50 mg/dL Final  02/24/2014 11:40 AM 1.18 0.50 - 1.35 mg/dL Final  01/14/2014 09:31 AM 1.23 0.50 - 1.35 mg/dL Final  09/30/2013 08:49 AM 1.1 0.40 - 1.50 mg/dL Final  08/27/2012 02:32 PM 1.2 0.40 - 1.50 mg/dL Final  08/08/2011 10:02 AM 1.1 0.40 - 1.50 mg/dL Final  07/26/2010 12:35 PM 1.1 0.40 - 1.50 mg/dL Final  07/04/2009 07:31 AM 1.1 0.4 - 1.5 mg/dL Final  06/29/2008 07:39 AM 1.0 0.4 - 1.5 mg/dL Final  04/30/2007 07:43 AM 0.9 0.4 - 1.5 mg/dL Final  04/23/2006 07:31 AM 1.0 0.4 - 1.5 mg/dL Final     PMHx:   Past Medical History:  Diagnosis Date  . BPH (benign prostatic hyperplasia)   .  Colon polyp   . Diabetes mellitus without complication ([place])   . Hemorrhoid   . Hyperlipemia   . Hypertension   . Spinal stenosis     Past Surgical History:  Procedure Laterality Date  . APPENDECTOMY  1966  . COLONOSCOPY  2013   had it close to end of year, exam was normal  . Drain fluid from lungs  1938   states "I was 86 year old"  . LUMBAR WOUND DEBRIDEMENT N/A 02/24/2014   Procedure: Revision of Lumbar Wound;  Surgeon: Eustace Moore, MD;  Location: [place] [facility];  Service: Neurosurgery;  Laterality: N/A;  Revision of Lumbar Wound  . MAXIMUM ACCESS (MAS)POSTERIOR LUMBAR INTERBODY FUSION (PLIF) 1 LEVEL N/A 01/21/2014   Procedure: FOR MAXIMUM ACCESS (MAS) POSTERIOR LUMBAR INTERBODY FUSION (PLIF) LUMBAR FOUR  FIVE;  Surgeon: Eustace Moore, MD;  Location: [place] [facility];  Service: Neurosurgery;  Laterality: N/A;    Family Hx:  Family History  Problem Relation Age of Onset  . Heart disease Father   . Colon cancer Neg Hx     Social History:  reports that he has never smoked. He has never used smokeless tobacco. He reports that he does not drink alcohol and does not use drugs.  Allergies: No Known Allergies  Medications: Prior to Admission medications   Medication Sig Start Date End Date Taking? Authorizing Provider  amLODipine (NORVASC) 5 MG tablet Take 1 tablet (5 mg total) by mouth daily. 08/10/19  Yes Vivi Barrack, MD  lisinopril-hydrochlorothiazide (ZESTORETIC) 20-12.5 MG tablet Take 2 tablets by mouth daily. 08/10/19  Yes Vivi Barrack, MD  metFORMIN (GLUCOPHAGE) 1000 MG tablet Take 1 tablet (1,000 mg total) by mouth 2 (two) times daily with a meal. 08/10/19  Yes Vivi Barrack, MD  metoprolol tartrate (LOPRESSOR) 25 MG tablet Take 1 tablet (25 mg total) by mouth 2 (two) times daily. 08/10/19  Yes Vivi Barrack, MD  Multiple Vitamins-Minerals (CENTRUM PO) Take 1 tablet by mouth daily.    Yes [provider]  ROCKLATAN 0.02-0.005 % SOLN Apply 1 drop to eye in the morning and at bedtime.  12/29/19  Yes [provider]  simvastatin (ZOCOR) 20 MG tablet Take 1 tablet (20 mg total) by mouth every evening. 08/10/19  Yes Vivi Barrack, MD  timolol (TIMOPTIC) 0.5 % ophthalmic solution Place 1 drop into both eyes in the morning and at bedtime.  02/04/16  Yes [provider]  glucose blood (ONETOUCH VERIO) test strip Use as instructed to check blood sugar once a day. 02/05/14   Lucretia Kern, DO  ONETOUCH DELICA LANCETS 60V MISC Use as directed to check blood sugar once a day 02/05/14   Lucretia Kern, DO    I have reviewed the patient's current and reported prior to admission medications.  Labs:  BMP Latest Ref Rng & Units 03/09/2020 03/09/2020 03/09/2020  Glucose 70 - 99  mg/dL - - 201(H)  BUN 8 - 23 mg/dL - - -  Creatinine 0.61 - 1.24 mg/dL - - -  BUN/Creat Ratio 6 - 22 (calc) - - -  Sodium 135 - 145 mmol/L 139 143 -  Potassium 3.5 - 5.1 mmol/L 7.2(HH) 5.8(H) 6.0(H)  Chloride 98 - 111 mmol/L - - -  CO2 22 - 32 mmol/L - - -  Calcium 8.9 - 10.3 mg/dL - - -    Urinalysis    Component Value Date/Time   COLORURINE YELLOW 03/08/2020 0636   APPEARANCEUR HAZY (  A) 03/08/2020 0636   LABSPEC 1.021 03/08/2020 0636   PHURINE 5.0 03/08/2020 0636   GLUCOSEU NEGATIVE 03/08/2020 0636   GLUCOSEU NEGATIVE 07/26/2010 1235   HGBUR NEGATIVE 03/08/2020 0636   BILIRUBINUR NEGATIVE 03/08/2020 0636   KETONESUR NEGATIVE 03/08/2020 0636   PROTEINUR NEGATIVE 03/08/2020 0636   UROBILINOGEN 0.2 07/26/2010 1235   NITRITE NEGATIVE 03/08/2020 0636   LEUKOCYTESUR NEGATIVE 03/08/2020 0636     ROS:  Unable to obtain 2/2 intubated    Physical Exam: Vitals:   03/09/20 1100 03/09/20 1130  BP:    Pulse: 84 84  Resp: (!) 27 (!) 23  Temp:    SpO2: (!) 89% (!) 86%     General: Adult male intubated critically ill HEENT: Normocephalic atraumatic Heart S1-S2 no rub appreciated Lungs: Reduced coarse mechanical breath sounds Abdomen: Soft distended Extremities: No edema appreciated no cyanosis or clubbing Neuro: Sedation running Psych unable to assess 2/2 obtunded GU has a Foley  Assessment/Plan:  # Hyperkalemia - Starting CRRT.  All 0 K fluids to start then transition as appropriate per BID labs  # AKI  - multifactorial with ATN from hypotension/shock and setting of covid PNA  - Starting CRRT; appreciate pulm/critical care placing line - Keep even as tolerated   # Acute hypoxic respiratory failure  - on mechanical ventilation per pulm  # covid PNA  - s/p therapies as above - per pulmonology   # Superimposed HCAP  - abx per primary team   # Septic shock  - Pressors per critical care  - NS 250 mL once now (not to be removed with CRRT) and may need additional  boluses  Claudia Desanctis 03/09/2020, 12:18 PM

## 2020-03-09 NOTE — Progress Notes (Signed)
Notified E-Link pt requiring increased vasopressor support and has decreased urinary output. Pt also has O2 saturations in the 80s on 100% FIO2 and 16 PEEP. Plan to give PRN paralyitc, obtain CXR and ABG. Considering proning pending ABG results. Awaiting new orders at this time, plan has been discussed with Dr. Prudencio Burly and RT.

## 2020-03-09 NOTE — Progress Notes (Addendum)
eLink Physician-Brief Progress Note Patient Name: Brandon Santos DOB: 1933/10/06 MRN: [id number]   Date of Service  03/09/2020  HPI/Events of Note  86 yr old male Covid AHRF, getting aneuric/ATN , on 2 pressors now. K 7.1. co2 was ok. [place] at midnight was at 7.13 with pco2 at 68, hco3 at 23.  eICU Interventions  - Ordered Severe Hyperkalemia protocol. - follow ABG/K level  Notified bed side team to evaluate once. Full code.      Intervention Category Intermediate Interventions: Electrolyte abnormality - evaluation and management  Elmer Sow 03/09/2020, 3:06 AM   4;06 EKG> sinus, RBBB. qrs 14. No tall T waves. Follow labs post K Rx.

## 2020-03-09 NOTE — Progress Notes (Signed)
NAMEMelquan Ernsberger, MRN:  [id number], DOB:  1934/03/13, LOS: 32 ADMISSION DATE:  03/19/2020, CONSULTATION DATE:  03/08/2020 REFERRING MD:  Elgergawy, CHIEF COMPLAINT:  Acute Respiratory Failure   Brief History   86 yo unvaccinated male not feeling well for 9-10 days with increasing SOB. Found to be covid positive. Quickly deteriorated and required 15LPM per NRB. New secondary  bacterial pneumonia per CT 9/14, suspect aspiration. Now  tiring out , and decompensating, increased WOB and drop in sats. PCCM asked to intubate transfer to ICU and manage care.  Past Medical History  PMH DM, HLD, HTN, lumbar fusion.  Significant Hospital Events   9/2>>Chest x-ray: Patchy bilateral lung infiltrates 9/5>> renal ultrasound: Increased cortical echogenicity of both kidneys suggesting underlying medical renal disease, no hydronephrosis. 9/6>> chest x-ray: Stable bilateral lung opacities-concerning for multifocal pneumonia 9/9>> diffuse bilateral interstitial changes 9/9>> worsening hypoxemia-transition to heated high flow with NRB 9/10>> chest x-ray: Slight improvement in bilateral airspace disease when compared with the prior study 9/14>> Patient with worsening hypoxia and increased work of breathing, intubated and transferred to the ICU 9/15 on maximized pressors. Now in MODS w/ worsening cr and on-going hyperkalemia.  Consults:  9/14 PCCM  Procedures:  9/14 >> Intubation 9/14>>Central line placement via Femoral vein 9/14> Arterial line placement  Significant Diagnostic Tests:   9/14> CT Chest consistent with atypical/viral pneumonia such as COVID pneumonia.Dense airspace consolidation in the left lower lobe could be secondary bacterial pneumonia or possible aspiration.  9/14 >CT Abdomen and Pelvis No acute abdominal/pelvic findings or adenopathy. Partially calcified mesenteric lesion, possibly a benign process such as mesenteric infarct or other remote mesenteric inflammatory process. A  carcinoid tumor would be another possibility.Cholelithiasis.Aortic atherosclerosis.  9/14>CXR interstitial prominence with bibasilar airspace opacities most compatible with pneumonia. No change since prior study 9/14>KUB Bowel gas pattern is unremarkable. LEFT flank is however excluded from view. Stool and gas in the RIGHT lower quadrant.   Micro Data:  SARS-CoV-2 PCR  9/14>> Positive Blood culture x2 9/14>> Sputum 9/14>>mod gpc and gnr>>> uc 9/14 < 10K Antimicrobials:  Unasyn 9/14>>  9/14 Zosyn 9/15>>> Remdesivir 9/14>>completed  Azithromycin 9/14>>completed  zyvox 9/15>>>  Interim history/subjective:  Increasing pressor needs w/ worsening MODS Objective   Blood pressure (!) 100/48, pulse 81, temperature 97.7 F (36.5 C), temperature source Axillary, resp. rate (!) 28, height 5\' 6"  (1.676 m), weight 85 kg, SpO2 (!) 89 %.    Vent Mode: PRVC FiO2 (%):  [60 %-100 %] 100 % Set Rate:  [20 bmp-26 bmp] 26 bmp Vt Set:  [380 mL] 380 mL PEEP:  [15 cmH20] 15 cmH20 Plateau Pressure:  [29 cmH20-40 cmH20] 29 cmH20   Intake/Output Summary (Last 24 hours) at 03/09/2020 0902 Last data filed at 03/09/2020 0554 Gross per 24 hour  Intake 2055.18 ml  Output 385 ml  Net 1670.18 ml   Filed Weights   03/21/2020 1200 02/26/20 0800  Weight: 84.5 kg 85 kg    Examination: General 86 year old male sedated on vent HENT NCAT no JVD MMM moist  Pulm decreased bilaterally currently pplat 29/ driving pressure 14 currently on FIO2 100% and peep 15 Card rrr  abd soft Ext warm pulses palp Neuro sedated.   Resolved Hospital Problem list     Assessment & Plan:   Acute Hypoxic Hypercapneic Respiratory Failure secondary to Covid-19 pneumonia and possible superimposed bacterial pneumonia. I suspect at this point HCAP driving factor  pcxr w/ diffuse bilateral airspace disease.   abg reviewed has  both hypoxic and hypercarbic resp failure  Got actemra on 9/3 Completed remdesivir 9/6 Plan Cont full  vent support -->RR inc to 30 to meet Ve needs VT 6 ml/kg pbw Goal PPlat < 30 goal driving pressure < 15 pao2 goal 55-65 vap bundle  Pad protocol rass goal -4 to -5  Sepsis shock/MODS w/ lactic acidosis  secondary to HCAP superimposed on COVID PNA Plan Cont to trend fever and wbc curve Cont zosyn day 2 Start zyvox  Ck scvo2 and repeat LA  Change steroids to stress dose  Cont to monitor fever and wbc curve  Keep euvolemic Stop all antihypertensives (remove from [facility]) Correct acidosis (goal ph >7.2)   Acute on chronic Kidney Injury now with worsening creatine and hyperkalemia. ->suspect that the acidosis is contributing to hyperkalemia  -treating hyperkalemia -started on bicarb gtt for refractory resp acidosis and Ph < 7.2) Plan Will likely need CRRT Cont bicarb gtt for now  Will ask nephrology to see but I do not think he is going to survive   Acute Metabolic Encephalopathy in the setting of severe illness as above and hypercapnia Plan Supportive care PAD protocol     Uncontrolled Diabetes mellitus  Recent HgbA1c 7.0 Plan ssi  Goal cbg 140-180   Best practice:  Diet: NPO Pain/Anxiety/Delirium protocol (if indicated): Fentanyl  VAP protocol (if indicated): Initiated DVT prophylaxis: Lovenox GI prophylaxis: Protonix Glucose control: CBG with SSI Mobility: BR Code Status: Full Family Communication: Dr. Kennon Rounds updated son Disposition: ICU   My cct 45 minutes  Erick Colace ACNP-BC [place]

## 2020-03-09 NOTE — Progress Notes (Signed)
PT Cancellation Note  Patient Details Name: Brandon Santos MRN: [id number] DOB: Mar 18, 1934   Cancelled Treatment:    Reason Eval/Treat Not Completed: Medical issues which prohibited therapy. Pt with medical decline; intubated and sedated in ICU. PT will sign off, please reconsult if pt improves. Thank you for this consult.  Mabeline Caras, PT, DPT Acute Rehabilitation Services  Pager [phone] Office [place] 03/09/2020, 1:37 PM

## 2020-03-09 NOTE — Progress Notes (Signed)
eLink Physician-Brief Progress Note Patient Name: Brandon Santos DOB: 09-02-1933 MRN: [id number]   Date of Service  03/09/2020  HPI/Events of Note  Episode of hypotensive SBP 60s now 84/45 K 5.5 after shift Ongoing CRRT  eICU Interventions  Increase vasopressin to 0.04 Will give albumin 25%. CVP relatively low at 12 as patient on positive pressure ventilation with PEEP of 15 and proned     Intervention Category Major Interventions: Hypotension - evaluation and management  Judd Lien 03/09/2020, 11:14 PM

## 2020-03-09 NOTE — Progress Notes (Addendum)
ABG reviewed w/Dr. Prudencio Burly. Plan discussed to start bicarb gtt.

## 2020-03-09 NOTE — Progress Notes (Signed)
Post prone critical abg results given to Dr. Carlis Abbott.   PH 7.21  PCO2 60.5  PAO2 80  HCO3 24.8  SAO2 93%   K 7.1  RT will continue to monitor.

## 2020-03-09 NOTE — Progress Notes (Signed)
Nutrition Follow-up  DOCUMENTATION CODES:   Non-severe (moderate) malnutrition in context of acute illness/injury  INTERVENTION:   Tube Feeding via Cortrak (post pyloric): Vital 1.5 at 50 ml/hr Pro-Source TF 45 mL QID Provides 1960 kcals, 125 g of protein and 912 mL of free water Meets 100% estimated calorie and protein needs  B-complex with C to account for losses on CRRT  Need new weight; last weight from 9/03  NUTRITION DIAGNOSIS:   Moderate Malnutrition related to acute illness (COVID 19) as evidenced by mild muscle depletion, mild fat depletion, moderate muscle depletion, energy intake < or equal to 50% for > or equal to 5 days.  Being addressed via TF  GOAL:   Patient will meet greater than or equal to 90% of their needs  Progressing  MONITOR:   Vent status, TF tolerance, Weight trends, Labs  REASON FOR ASSESSMENT:   Consult Assessment of nutrition requirement/status  ASSESSMENT:   86 y.o. male with PMHx of HTN, HLD, DM-2 presents with worsening shortness of breath. Pt found to have acute hypoxic respiratory failure secondary to COVID-19 pneumonia.  9/02 Admitted  9/14 Intubated  Worsening renal function, Starting on CRRT today Pt remains on full vent support, worsening pressor requirements; requiring levophed (55 mcg.min), vasopressin (0.03 units/min)  Hyperkalemia; should improve with CRRT (noted 0 K+ bath today)  Meal completion prior to intubation had been poor, 5-20% of meals. Taking some Glucerna and Pro-Stat  No weight since 9/03. Need new weight  Labs: potassium 6.3 (H) Meds: colace, ss novolog, levemir, miralax, lokelma   Diet Order:   Diet Order            Diet NPO time specified  Diet effective now                 EDUCATION NEEDS:   Not appropriate for education at this time  Skin:  Skin Assessment: Reviewed RN Assessment  Last BM:  9/14  Height:   Ht Readings from Last 1 Encounters:  03/08/20 5\' 6"  (1.676 m)     Weight:   Wt Readings from Last 1 Encounters:  02/26/20 85 kg    BMI:  Body mass index is 30.25 kg/m.  Estimated Nutritional Needs:   Kcal:  1850-2050  Protein:  120-140 g  Fluid:  >/= 1.8 L/day  Kerman Passey MS, RDN, LDN, CNSC Registered Dietitian III Clinical Nutrition RD Pager and On-Call Pager Number Located in [place]

## 2020-03-09 NOTE — Progress Notes (Addendum)
Pt BP decreasing despite change to 10x strength Neo and increase in Neo rate.Reginal Lutes to request increase in dose limit on Levo.  Will continue to monitor.

## 2020-03-09 NOTE — Progress Notes (Signed)
Speech Language Pathology Discharge Patient Details Name: Brandon Santos MRN: [id number] DOB: [birth year] Today's Date: 03/09/2020 Time:  -     Patient discharged from SLP services secondary to medical decline - will need to re-order SLP to resume therapy services.Pt intubated. Will sign off and please reconsult if he improves.   Please see latest therapy progress note for current level of functioning and progress toward goals.    Progress and discharge plan discussed with patient and/or caregiver: Patient unable to participate in discharge planning and no caregivers available  GO     [name] 03/09/2020, 8:26 AM   [name] Colvin Caroli.Ed Risk analyst [phone] Office [phone]

## 2020-03-09 NOTE — Progress Notes (Signed)
eLink Physician-Brief Progress Note Patient Name: Brandon Santos DOB: 1934-01-05 MRN: [id number]   Date of Service  03/09/2020  HPI/Events of Note  Follow up  K is 6.  eICU Interventions  Continue care, now on bicarb gtt. Consider CRRT. Nephrology consult in AM.        Intervention Category Minor Interventions: Electrolytes abnormality - evaluation and management  Elmer Sow 03/09/2020, 5:07 AM

## 2020-03-09 NOTE — Progress Notes (Signed)
Pt ETT hollister removed and ETT resecured with cloth tape at 26cm at the lip. Pt then placed into the prone position with RT and RN x5 without difficulty. RT will continue to monitor.

## 2020-03-09 NOTE — Progress Notes (Signed)
[place] Progress Note Patient Name: Brandon Santos DOB: [birth year] MRN: [id number]   Date of Service  03/09/2020  HPI/Events of Note  ABG reviewed. On 2 pressors. Cr > 2.5. metabolic and resp acidosis.   eICU Interventions  Start Sod bicarb gtt at 50 ml/hr. Follow ABG at 7 AM.      Intervention Category Intermediate Interventions: Other:  Elmer Sow 03/09/2020, 4:30 AM

## 2020-03-09 NOTE — Progress Notes (Signed)
[place] Progress Note Patient Name: Brandon Santos DOB: 1933/11/24 MRN: [id number]   Date of Service  03/09/2020  HPI/Events of Note  ABG worsening hypercarbic failure and P/F ratio. Camera: sats 85%.  CxR: no pneumo. ET in place. Basal interstitial changes bilaterally.  D dimer low at 0.44 on 38 th.    eICU Interventions  Follow d dimer. Increase rate to 26 and if not better, Trial of prone ventilation.     Intervention Category Intermediate Interventions: Other:;Diagnostic test evaluation  Elmer Sow 03/09/2020, 1:08 AM

## 2020-03-09 NOTE — Progress Notes (Signed)
When attempting to turn patient's head, patient became more unstable. Patient's blood pressure and oxygen saturations dropped. RN and RT decided to leave patient alone at that time. Blood pressure and oxygen saturations improved. Will reattempt to turn head later.

## 2020-03-09 NOTE — Progress Notes (Signed)
CRITICAL VALUE ALERT  Critical Value:  Potassium-6.8, Phosphorus >30  Date & Time Notied:  03/09/20 1835  Provider Notified: Dr. Carlis Abbott  Orders Received/Actions taken: No new orders at this time.  Irven [place], RN

## 2020-03-09 NOTE — Progress Notes (Signed)
RT Note  Transfer patient from 61M room 3 to 61M room 8 without any complications. RT will continue to monitor.

## 2020-03-09 NOTE — Progress Notes (Addendum)
PHARMACY NOTE:  ANTIMICROBIAL RENAL DOSAGE ADJUSTMENT  Current antimicrobial regimen includes a mismatch between antimicrobial dosage and estimated renal function.  As per policy approved by the Pharmacy & Therapeutics and Medical Executive Committees, the antimicrobial dosage will be adjusted accordingly.  Current antimicrobial dosage:  Zosyn EID 3.375gm IV Q8H  Indication: aspiration PNA  Renal Function:  Estimated Creatinine Clearance: 19.8 mL/min (A) (by C-G formula based on SCr of 2.89 mg/dL (H)). []      On intermittent HD, scheduled: []      On CRRT    Antimicrobial dosage has been changed to:  Zosyn 2.25gm IV Q8H  Additional comments: adjust if starts on CRRT   Brandon Santos D. Mina Marble, PharmD, BCPS, [place] 03/09/2020, 9:17 AM  ===============================  Addendum: Start CRRT Change Zosyn to 3.375gm IV Q6H, infuse each dose over 30 min  Brandon Santos D. Mina Marble, PharmD, BCPS, [place] [facility] 03/09/2020, 1:01 PM

## 2020-03-09 NOTE — Progress Notes (Addendum)
[place] Progress Note Patient Name: Brandon Santos DOB: [birth year] MRN: [id number]   Date of Service  03/09/2020  HPI/Events of Note  Notified of hypotension on vasopressin, neosynephrine and max norepinephrine 40. Request for increased max dose of norepinephrine which he was on 55 earlier.  On review BP 72/47, HR 65, K 7.1 Given Lokelma, on Bicarb drip, ongoing CRRT with zero K bath, positive 200/hr, CVP 12  eICU Interventions  Ordered to increase norepinephrine max for now Ordered calcium, D50/insulin and bicarb push and monitor K q 2     Intervention Category Major Interventions: Hypotension - evaluation and management;Electrolyte abnormality - evaluation and management  Judd Lien 03/09/2020, 8:38 PM

## 2020-03-09 NOTE — Progress Notes (Signed)
OT Cancellation Note  Patient Details Name: Brandon Santos MRN: [id number] DOB: December 01, 1933   Cancelled Treatment:    Reason Eval/Treat Not Completed: Other (comment) (Pt with medical decline; intubated and sedated in ICU. OT will sign off, please reconsult if pt improves. Thank you for this consult)  Zenovia Jarred, MSOT, OTR/L [facility] Office Number: [phone] Pager: [phone]  Zenovia Jarred 03/09/2020, 1:06 PM

## 2020-03-09 NOTE — Progress Notes (Signed)
[place] Progress Note Patient Name: Brandon Santos DOB: 08-10-1933 MRN: [id number]   Date of Service  03/09/2020  HPI/Events of Note  Camera eval: Sats 85%, PIP 33, PEEP 16. On and off asynchrony as per bed side RN.  On Levo at 35 mcg/min. MAP > 75. HR ok. Obese.  Asking for prone?Marland Kitchen  eICU Interventions  Will get ABG and CxR. Rocuronium or vecuronium once for asynchrony.      Intervention Category Intermediate Interventions: Respiratory distress - evaluation and management  Elmer Sow 03/09/2020, 12:10 AM

## 2020-03-09 NOTE — Procedures (Signed)
Central Venous Catheter Insertion Procedure Note  Brandon Santos  [id number]  03/07/1934  Date:03/09/20  Time:1:25 PM   Provider Performing:Pete Johnette Abraham Kary Kos   Procedure: Insertion of Non-tunneled Central Venous Catheter(36556)with US guidance (77373)    Indication(s) Medication administration and Hemodialysis  Consent Risks of the procedure as well as the alternatives and risks of each were explained to the patient and/or caregiver.  Consent for the procedure was obtained and is signed in the bedside chart  Anesthesia Topical only with 1% lidocaine   Timeout Verified patient identification, verified procedure, site/side was marked, verified correct patient position, special equipment/implants available, medications/allergies/relevant history reviewed, required imaging and test results available.  Sterile Technique Maximal sterile technique including full sterile barrier drape, hand hygiene, sterile gown, sterile gloves, mask, hair covering, sterile ultrasound probe cover (if used).  Procedure Description Area of catheter insertion was cleaned with chlorhexidine and draped in sterile fashion.   With real-time ultrasound guidance a HD catheter was placed into the right internal jugular vein.  Nonpulsatile blood flow and easy flushing noted in all ports.  The catheter was sutured in place and sterile dressing applied.  Complications/Tolerance None; patient tolerated the procedure well. Chest X-ray is ordered to verify placement for internal jugular or subclavian cannulation.  Chest x-ray is not ordered for femoral cannulation.  EBL Minimal  Specimen(s) None  Erick Colace ACNP-BC [place] Pager # [phone] OR # [phone] if no answer

## 2020-03-10 ENCOUNTER — Inpatient Hospital Stay (HOSPITAL_COMMUNITY): Payer: BC Managed Care – PPO

## 2020-03-10 DIAGNOSIS — N179 Acute kidney failure, unspecified: Secondary | ICD-10-CM

## 2020-03-10 DIAGNOSIS — L8990 Pressure ulcer of unspecified site, unspecified stage: Secondary | ICD-10-CM | POA: Insufficient documentation

## 2020-03-10 DIAGNOSIS — J9600 Acute respiratory failure, unspecified whether with hypoxia or hypercapnia: Secondary | ICD-10-CM

## 2020-03-10 DIAGNOSIS — E440 Moderate protein-calorie malnutrition: Secondary | ICD-10-CM | POA: Insufficient documentation

## 2020-03-10 LAB — POCT I-STAT 7, (LYTES, BLD GAS, ICA,H+H)
Acid-base deficit: 9 mmol/L — ABNORMAL HIGH (ref 0.0–2.0)
Acid-base deficit: 10 mmol/L — ABNORMAL HIGH (ref 0.0–2.0)
Acid-base deficit: 10 mmol/L — ABNORMAL HIGH (ref 0.0–2.0)
Bicarbonate: 22.9 mmol/L (ref 20.0–28.0)
Bicarbonate: 21 mmol/L (ref 20.0–28.0)
Bicarbonate: 22.2 mmol/L (ref 20.0–28.0)
Calcium, Ion: 0.97 mmol/L — ABNORMAL LOW (ref 1.15–1.40)
Calcium, Ion: 0.9 mmol/L — ABNORMAL LOW (ref 1.15–1.40)
Calcium, Ion: 1.02 mmol/L — ABNORMAL LOW (ref 1.15–1.40)
HCT: 43 % (ref 39.0–52.0)
HCT: 40 % (ref 39.0–52.0)
HCT: 38 % — ABNORMAL LOW (ref 39.0–52.0)
Hemoglobin: 14.6 g/dL (ref 13.0–17.0)
Hemoglobin: 13.6 g/dL (ref 13.0–17.0)
Hemoglobin: 12.9 g/dL — ABNORMAL LOW (ref 13.0–17.0)
O2 Saturation: 92 %
O2 Saturation: 92 %
O2 Saturation: 88 %
Patient temperature: 89.5
Patient temperature: 89.9
Patient temperature: 97.8
Potassium: 4.4 mmol/L (ref 3.5–5.1)
Potassium: 4.3 mmol/L (ref 3.5–5.1)
Potassium: 3.8 mmol/L (ref 3.5–5.1)
Sodium: 139 mmol/L (ref 135–145)
Sodium: 141 mmol/L (ref 135–145)
Sodium: 139 mmol/L (ref 135–145)
TCO2: 25 mmol/L (ref 22–32)
TCO2: 23 mmol/L (ref 22–32)
TCO2: 25 mmol/L (ref 22–32)
pCO2 arterial: 61.3 mmHg — ABNORMAL HIGH (ref 32.0–48.0)
pCO2 arterial: 57.7 mmHg — ABNORMAL HIGH (ref 32.0–48.0)
pCO2 arterial: 78.9 mmHg (ref 32.0–48.0)
pH, Arterial: 7.149 — CL (ref 7.350–7.450)
pH, Arterial: 7.137 — CL (ref 7.350–7.450)
pH, Arterial: 7.054 — CL (ref 7.350–7.450)
pO2, Arterial: 66 mmHg — ABNORMAL LOW (ref 83.0–108.0)
pO2, Arterial: 67 mmHg — ABNORMAL LOW (ref 83.0–108.0)
pO2, Arterial: 77 mmHg — ABNORMAL LOW (ref 83.0–108.0)

## 2020-03-10 LAB — MAGNESIUM
Magnesium: 2.6 mg/dL — ABNORMAL HIGH (ref 1.7–2.4)
Magnesium: 2.4 mg/dL (ref 1.7–2.4)

## 2020-03-10 LAB — RENAL FUNCTION PANEL
Albumin: 1.9 g/dL — ABNORMAL LOW (ref 3.5–5.0)
Anion gap: 12 (ref 5–15)
BUN: 29 mg/dL — ABNORMAL HIGH (ref 8–23)
CO2: 22 mmol/L (ref 22–32)
Calcium: 6.7 mg/dL — ABNORMAL LOW (ref 8.9–10.3)
Chloride: 104 mmol/L (ref 98–111)
Creatinine, Ser: 1.22 mg/dL (ref 0.61–1.24)
GFR calc Af Amer: >60 mL/min (ref >60)
GFR calc non Af Amer: 54 mL/min — ABNORMAL LOW (ref >60)
Glucose, Bld: 100 mg/dL — ABNORMAL HIGH (ref 70–99)
Phosphorus: 4.6 mg/dL (ref 2.5–4.6)
Potassium: 4.8 mmol/L (ref 3.5–5.1)
Sodium: 138 mmol/L (ref 135–145)

## 2020-03-10 LAB — COMPREHENSIVE METABOLIC PANEL
ALT: 50 U/L — ABNORMAL HIGH (ref 0–44)
AST: 45 U/L — ABNORMAL HIGH (ref 15–41)
Albumin: 2.4 g/dL — ABNORMAL LOW (ref 3.5–5.0)
Alkaline Phosphatase: 89 U/L (ref 38–126)
Anion gap: 19 — ABNORMAL HIGH (ref 5–15)
BUN: 61 mg/dL — ABNORMAL HIGH (ref 8–23)
CO2: 19 mmol/L — ABNORMAL LOW (ref 22–32)
Calcium: 7.1 mg/dL — ABNORMAL LOW (ref 8.9–10.3)
Chloride: 103 mmol/L (ref 98–111)
Creatinine, Ser: 1.95 mg/dL — ABNORMAL HIGH (ref 0.61–1.24)
GFR calc Af Amer: 36 mL/min — ABNORMAL LOW (ref >60)
GFR calc non Af Amer: 31 mL/min — ABNORMAL LOW (ref >60)
Glucose, Bld: 125 mg/dL — ABNORMAL HIGH (ref 70–99)
Potassium: 3.9 mmol/L (ref 3.5–5.1)
Sodium: 141 mmol/L (ref 135–145)
Total Bilirubin: 1.1 mg/dL (ref 0.3–1.2)
Total Protein: 5.2 g/dL — ABNORMAL LOW (ref 6.5–8.1)

## 2020-03-10 LAB — GLUCOSE, CAPILLARY
Glucose-Capillary: 104 mg/dL — ABNORMAL HIGH (ref 70–99)
Glucose-Capillary: 105 mg/dL — ABNORMAL HIGH (ref 70–99)
Glucose-Capillary: 113 mg/dL — ABNORMAL HIGH (ref 70–99)
Glucose-Capillary: 129 mg/dL — ABNORMAL HIGH (ref 70–99)
Glucose-Capillary: 188 mg/dL — ABNORMAL HIGH (ref 70–99)
Glucose-Capillary: 62 mg/dL — ABNORMAL LOW (ref 70–99)
Glucose-Capillary: 74 mg/dL (ref 70–99)

## 2020-03-10 LAB — LACTIC ACID, PLASMA
Lactic Acid, Venous: 7.4 mmol/L (ref 0.5–1.9)
Lactic Acid, Venous: 6.7 mmol/L (ref 0.5–1.9)

## 2020-03-10 LAB — POTASSIUM: Potassium: 5 mmol/L (ref 3.5–5.1)

## 2020-03-10 LAB — PROCALCITONIN: Procalcitonin: 0.9 ng/mL

## 2020-03-10 LAB — PHOSPHORUS: Phosphorus: 6.8 mg/dL — ABNORMAL HIGH (ref 2.5–4.6)

## 2020-03-10 MED ORDER — CALCIUM GLUCONATE-NACL 1-0.675 GM/50ML-% IV SOLN
1.0000 g | Freq: Once | INTRAVENOUS | Status: AC
  Administered 2020-03-10: 1000 mg via INTRAVENOUS
  Filled 2020-03-10: qty 50

## 2020-03-10 MED ORDER — DEXTROSE 50 % IV SOLN
INTRAVENOUS | Status: AC
  Administered 2020-03-10: 12.5 g via INTRAVENOUS
  Filled 2020-03-10: qty 50

## 2020-03-10 MED ORDER — DEXTROSE 50 % IV SOLN: 12.5000 g | INTRAVENOUS | Status: AC

## 2020-03-10 MED ORDER — PRISMASOL BGK 4/2.5 32-4-2.5 MEQ/L REPLACEMENT SOLN
Status: DC
  Filled 2020-03-10 (×4): qty 5000

## 2020-03-10 MED ORDER — PRISMASOL BGK 4/2.5 32-4-2.5 MEQ/L REPLACEMENT SOLN
Status: DC
  Filled 2020-03-10 (×3): qty 5000

## 2020-03-10 MED ORDER — SODIUM CHLORIDE 0.9 % IV BOLUS
500.0000 mL | Freq: Once | INTRAVENOUS | Status: AC
  Administered 2020-03-10: 500 mL via INTRAVENOUS

## 2020-03-10 MED ORDER — PRISMASOL BGK 4/2.5 32-4-2.5 MEQ/L IV SOLN
INTRAVENOUS | Status: DC
  Filled 2020-03-10 (×16): qty 5000

## 2020-03-10 NOTE — Progress Notes (Signed)
Bite block in place due to patient clenched down on tube causing patient to not get proper ventilation.

## 2020-03-10 NOTE — Progress Notes (Signed)
Patient placed in supine position by RT x 2 and RN x 3 without complications.  ETT re-secured at 26 cm on the right.

## 2020-03-10 NOTE — Progress Notes (Signed)
Head not turned at this time due to patient being unstable.

## 2020-03-10 NOTE — Progress Notes (Signed)
While attempting to turn patient's head, patient became more unstable (blood pressure and oxygen saturations dropped). Patient's neck is stiff and is an additional issue when attempted to turn head. RN and RT decided to leave patient in current position.

## 2020-03-10 NOTE — Progress Notes (Signed)
[facility] Progress Note  Name: Brandon Santos MRN: [id number] DOB: [birth year]  Subjective:  Has been on CRRT with goal of keep even - only 186 mL UF charted over 9/15.  He has been on 0 K fluids.  maxed on levo and epi and on vaso  Review of systems:  Unable to obtain 2/2 intubated  ------------------- Background on consult:  Alok Audino is a 86 y.o. male with a history of hypertension, diabetes mellitus, and BPH who presented to the hospital with shortness of breath.  He was diagnosed with Covid pneumonia.  He received Actemra, Decadron, and remdesivir.  He represented to the ICU with hospital-acquired pneumonia and unfortunately his course has been complicated by intubation and multiorgan failure including acute kidney injury and ongoing hyperkalemia.  Nephrology is consulted for assistance with management of AKI and hyperkalemia.  Pulmonary does assess him as a candidate for CRRT and his son wants to pursue dialysis/RRT.   Pulmonology has discussed that this may be a last option for him to escalate care.  He has been on pressors.  Levo is at 55 mcg/min and vaso is on as of my exam.   Intake/Output Summary (Last 24 hours) at 03/10/2020 0730 Last data filed at 03/10/2020 0700 Gross per 24 hour  Intake 5305.57 ml  Output 286 ml  Net 5019.57 ml    Vitals:  Vitals:   03/10/20 0452 03/10/20 0500 03/10/20 0600 03/10/20 0700  BP:      Pulse:  (!) 49 (!) 50 (!) 53  Resp:  (!) 28 15 19   Temp:      TempSrc:      SpO2:  96% 94% 94%  Weight: 79 kg     Height:         Physical Exam:   General: Adult male intubated critically ill HEENT: Normocephalic atraumatic Heart S1-S2 no rub appreciated Lungs: Reduced coarse mechanical breath sounds Abdomen: Soft distended Extremities: No edema appreciated no cyanosis or clubbing Neuro: Sedation running Psych unable to assess 2/2 obtunded GU has a Foley Access nontunneled HD catheter  Medications reviewed   Labs:  BMP  Latest Ref Rng & Units 03/10/2020 03/10/2020 03/10/2020  Glucose 70 - 99 mg/dL - 125(H) -  BUN 8 - 23 mg/dL - 61(H) -  Creatinine 0.61 - 1.24 mg/dL - 1.95(H) -  BUN/Creat Ratio 6 - 22 (calc) - - -  Sodium 135 - 145 mmol/L 141 141 139  Potassium 3.5 - 5.1 mmol/L 4.3 3.9 4.4  Chloride 98 - 111 mmol/L - 103 -  CO2 22 - 32 mmol/L - 19(L) -  Calcium 8.9 - 10.3 mg/dL - 7.1(L) -     Assessment/Plan:   # Hyperkalemia - Continue CRRT.  Transition to 4 K fluids   # AKI  - multifactorial with ATN from hypotension/shock and setting of covid PNA and subsequent HCAP - Continue CRRT - transition to 4K fluids for now and follow BID labs  - Keep even as tolerated   # Acute hypoxic respiratory failure  - on mechanical ventilation per pulm  # covid PNA  - s/p therapies as above - per pulmonology   # Superimposed HCAP  - abx per primary team   # Septic shock  - Pressors per critical care  - NS 500 mL once now (not to be removed with CRRT)  # Hypocalcemia - replete   Claudia Desanctis, MD 03/10/2020 8:01 AM

## 2020-03-10 NOTE — Progress Notes (Addendum)
Pt became agitated and dyssynchronous upon attempting to turn his head with respiratory therapist.  His hypotension worsened at this time as well.  Vent dyssynchrony improved with the placement of a bite block and agitation and hypotension slowly improved once the patient was no longer being repositioned.  Pt head unable to be turned at this time.  Will continue to monitor.

## 2020-03-10 NOTE — Progress Notes (Addendum)
NAMEElden Santos, MRN:  [id number], DOB:  05/24/1934, LOS: 24 ADMISSION DATE:  03/10/2020, CONSULTATION DATE:  03/08/2020 REFERRING MD:  Elgergawy, CHIEF COMPLAINT:  Acute Respiratory Failure   Brief History   86 yo unvaccinated male not feeling well for 9-10 days with increasing SOB. Found to be covid positive. Quickly deteriorated and required 15LPM per NRB. New secondary  bacterial pneumonia per CT 9/14, suspect aspiration. Now  tiring out , and decompensating, increased WOB and drop in sats. PCCM asked to intubate transfer to ICU and manage care.  Past Medical History  PMH DM, HLD, HTN, lumbar fusion.  Significant Hospital Events   9/2>>Chest x-ray: Patchy bilateral lung infiltrates 9/5>> renal ultrasound: Increased cortical echogenicity of both kidneys suggesting underlying medical renal disease, no hydronephrosis. 9/6>> chest x-ray: Stable bilateral lung opacities-concerning for multifocal pneumonia 9/9>> diffuse bilateral interstitial changes 9/9>> worsening hypoxemia-transition to heated high flow with NRB 9/10>> chest x-ray: Slight improvement in bilateral airspace disease when compared with the prior study 9/14>> Patient with worsening hypoxia and increased work of breathing, intubated and transferred to the ICU 9/15 on maximized pressors. Now in MODS w/ worsening cr and on-going hyperkalemia.  9/15> Nephrology consulted for worsening AKI and Hyperkalemia. CRRT started 9/15> Remained hypotensive on vasopressin, Neo and max Norepinephrine.Vasopressin increased to 0.04, s/p Albumin administration due to low CVP of 12.    Consults:  9/14 PCCM  Procedures:  9/14 >> Intubation 9/14>>Central line placement via Femoral vein 9/14> Arterial line placement 9/15> Right IJ Trialysis catheter placed  Significant Diagnostic Tests:   9/14> CT Chest consistent with atypical/viral pneumonia such as COVID pneumonia.Dense airspace consolidation in the left lower lobe could be secondary  bacterial pneumonia or possible aspiration.  9/14 >CT Abdomen and Pelvis No acute abdominal/pelvic findings or adenopathy. Partially calcified mesenteric lesion, possibly a benign process such as mesenteric infarct or other remote mesenteric inflammatory process. A carcinoid tumor would be another possibility.Cholelithiasis.Aortic atherosclerosis.  9/14>CXR interstitial prominence with bibasilar airspace opacities most compatible with pneumonia. No change since prior study 9/14>KUB Bowel gas pattern is unremarkable. LEFT flank is however excluded from view. Stool and gas in the RIGHT lower quadrant.   Micro Data:  SARS-CoV-2 PCR  9/14>> Positive Blood culture x2 9/14>> Sputum 9/14>>mod gpc and gnr>>> uc 9/14 < 10K Antimicrobials:  Unasyn 9/14>>  9/14 Zosyn 9/15>>> Remdesivir 9/14>>completed  Azithromycin 9/14>>completed  zyvox 9/15>>>  Interim history/subjective:  Pressor requirements up. Still hypotensive  Objective   Blood pressure (!) 98/45, pulse (!) 53, temperature (!) 89.9 F (32.2 C), temperature source Rectal, resp. rate (!) 8, height 5\' 6"  (1.676 m), weight 79 kg, SpO2 96 %. CVP:  [8 mmHg-15 mmHg] 9 mmHg  Vent Mode: PRVC FiO2 (%):  [100 %] 100 % Set Rate:  [30 bmp] 30 bmp Vt Set:  [380 mL] 380 mL PEEP:  [15 cmH20] 15 cmH20 Plateau Pressure:  [32 cmH20-35 cmH20] 33 cmH20   Intake/Output Summary (Last 24 hours) at 03/10/2020 0926 Last data filed at 03/10/2020 0900 Gross per 24 hour  Intake 5681.26 ml  Output 282 ml  Net 5399.26 ml   Filed Weights   03/12/2020 1200 02/26/20 0800 03/10/20 0452  Weight: 84.5 kg 85 kg 79 kg    Examination: General: This is an 86 year old male patient currently heavily sedated on a fentanyl infusion and remains on full ventilatory support HEENT: Nontraumatic, orally intubated.  Does have scleral edema but is noninjected no JVD mucous membranes moist Pulmonary: Decreased bilaterally.  Currently on  FiO2 of 100%/PEEP 15%.  Current plateau  pressures at 37,Driving pressures are currently 22.  We are unable to pick up pulse oximetry.  He is just been placed in the supine position Cardiac: Bradycardic, with what appears to be intermittent AV block Abdomen: Soft, hypoactive Extremities: Cool, pulses weak.  Capillary refill sluggish Neuro: Heavily sedated and unresponsive currently Resolved Hospital Problem list     Assessment & Plan:   Acute Hypoxic Hypercapneic Respiratory Failure secondary to Covid-19 pneumonia and possible superimposed bacterial pneumonia. I suspect at this point HCAP driving factor    abg reviewed no improvement.  Continues to have significant respiratory acidosis, also underlying metabolic derangements.  His minute ventilation is maximized within the limitations of his ventilator mechanics Got actemra on 9/3 Completed remdesivir 9/6 Plan Continue full ventilator support, low tidal volume ventilation at 60 mils per kilogram predicted body weight P plat goal less than 30, driving pressure goal less than 15 PaO2 goal 55-65, were currently meeting this  Prone positioning to continue 16 hours a day as long as P/F ratio less than 150  PAD protocol RASS goal -4 to -5  Intermittent neuromuscular blockade  VAP bundle  Chest x-ray today  Repeat arterial blood gas now the he is in supine position  Sepsis shock/MODS w/ lactic acidosis  secondary to HCAP superimposed on COVID PNA Plan Continue trend fever and white blood cell curve Day #3 Zosyn Day #2 Zyvox Stress dose steroids Holding antihypertensives Continuing bicarbonate drip with goal to keep pH above 7.2 Continue telemetry monitoring Continue current vasoactive drips, no further titration available: He is on high dose norepinephrine, phenylephrine, and vasopressin He is not a candidate giapreza  Keep euvolemic, CVP goal 8-12 at this point  Acute on chronic Kidney Injury now with worsening creatine and hyperkalemia. CRRT initiated on 9/15 Keeping  even volume status since initiating CRRT, acid-base is actually worse, but potassium now normalized Plan Continue CRRT as directed by nephrology  Nephrology changing dialysate to 4K bath today, continue twice daily lab checks  Acute Metabolic Encephalopathy in the setting of severe illness as above and hypercapnia Plan PAD protocol RASS goal -5 to -4 given intermittent need for neuromuscular blockade Supportive care    Uncontrolled Diabetes mellitus  Recent HgbA1c 7.0 Plan Continue sliding scale insulin, for CBG goal 140-180. Stop levemir (glucose actually trending lower)   Best practice:  Diet: NPO-->tubefeeds Pain/Anxiety/Delirium protocol (if indicated): Fentanyl  VAP protocol (if indicated): Initiated DVT prophylaxis: Lovenox GI prophylaxis: Protonix Glucose control: CBG with SSI Mobility: BR Code Status: Full Family Communication: Dr. Kennon Rounds updated son Disposition: ICU  My cct 34 minutes  Erick Colace ACNP-BC [place]

## 2020-03-10 NOTE — Progress Notes (Signed)
I called the son. I have nothing else to offer. He will come in to have an opportunity to say goodbye   Erick Colace ACNP-BC [place] Pager # [phone] OR # [phone] if no answer

## 2020-03-10 NOTE — Progress Notes (Signed)
Hypoglycemic Event  CBG: 62  Treatment: d5 12.5g  Symptoms: None  Follow-up CBG: Time:1140 CBG Result:113  Possible Reasons for Event: Decreased in TF rate  Comments/MD notified: Hypoglycemic protocol followed    Ernestene Kiel

## 2020-03-11 LAB — CULTURE, RESPIRATORY W GRAM STAIN
Culture: NORMAL
Gram Stain: NONE SEEN

## 2020-03-11 LAB — COMPREHENSIVE METABOLIC PANEL
ALT: 96 U/L — ABNORMAL HIGH (ref 0–44)
AST: 151 U/L — ABNORMAL HIGH (ref 15–41)
Albumin: 1.8 g/dL — ABNORMAL LOW (ref 3.5–5.0)
Alkaline Phosphatase: 88 U/L (ref 38–126)
Anion gap: 13 (ref 5–15)
BUN: 20 mg/dL (ref 8–23)
CO2: 19 mmol/L — ABNORMAL LOW (ref 22–32)
Calcium: 6.6 mg/dL — ABNORMAL LOW (ref 8.9–10.3)
Chloride: 105 mmol/L (ref 98–111)
Creatinine, Ser: 1.06 mg/dL (ref 0.61–1.24)
GFR calc Af Amer: >60 mL/min (ref >60)
GFR calc non Af Amer: >60 mL/min (ref >60)
Glucose, Bld: 114 mg/dL — ABNORMAL HIGH (ref 70–99)
Potassium: 4.7 mmol/L (ref 3.5–5.1)
Sodium: 137 mmol/L (ref 135–145)
Total Bilirubin: 1 mg/dL (ref 0.3–1.2)
Total Protein: 3.9 g/dL — ABNORMAL LOW (ref 6.5–8.1)

## 2020-03-11 LAB — GLUCOSE, CAPILLARY
Glucose-Capillary: 128 mg/dL — ABNORMAL HIGH (ref 70–99)
Glucose-Capillary: 98 mg/dL (ref 70–99)
Glucose-Capillary: 109 mg/dL — ABNORMAL HIGH (ref 70–99)

## 2020-03-11 LAB — MAGNESIUM: Magnesium: 2.4 mg/dL (ref 1.7–2.4)

## 2020-03-11 LAB — PHOSPHORUS: Phosphorus: 4.1 mg/dL (ref 2.5–4.6)

## 2020-03-11 MED ORDER — DIPHENHYDRAMINE HCL 50 MG/ML IJ SOLN: 25.0000 mg | INTRAMUSCULAR | Status: DC | PRN

## 2020-03-11 MED ORDER — GLYCOPYRROLATE 0.2 MG/ML IJ SOLN: 0.2000 mg | INTRAMUSCULAR | Status: DC | PRN

## 2020-03-11 MED ORDER — CALCIUM GLUCONATE-NACL 1-0.675 GM/50ML-% IV SOLN
1.0000 g | Freq: Once | INTRAVENOUS | Status: AC
  Administered 2020-03-11: 1000 mg via INTRAVENOUS
  Filled 2020-03-11: qty 50

## 2020-03-11 MED ORDER — GLYCOPYRROLATE 1 MG PO TABS: 1.0000 mg | ORAL_TABLET | ORAL | Status: DC | PRN

## 2020-03-11 MED ORDER — LORAZEPAM 2 MG/ML IJ SOLN: 0.5000 mg/h | INTRAVENOUS | Status: DC

## 2020-03-11 MED ORDER — DEXTROSE 5 % IV SOLN: INTRAVENOUS | Status: DC

## 2020-03-13 LAB — CULTURE, BLOOD (ROUTINE X 2)
Culture: NO GROWTH
Culture: NO GROWTH
Special Requests: ADEQUATE

## 2020-03-23 ENCOUNTER — Encounter (INDEPENDENT_AMBULATORY_CARE_PROVIDER_SITE_OTHER): Payer: BC Managed Care – PPO | Admitting: Ophthalmology

## 2020-03-25 NOTE — Progress Notes (Signed)
Long discussion w/ son Milta Deiters. No improvement. Clinically appears to be actively dying.  Plan Dc vasoactive gtts Titrate fent and versed for comfort Extubate once we know he is comfortable   Erick Colace ACNP-BC [place] Pager # [phone] OR # [phone] if no answer

## 2020-03-25 NOTE — Progress Notes (Addendum)
NAMEAtlas Santos, MRN:  [id number], DOB:  [birth year], LOS: 49 ADMISSION DATE:  03/19/2020, CONSULTATION DATE:  03/08/2020 REFERRING MD:  Brandon Santos, CHIEF COMPLAINT:  Acute Respiratory Failure   Brief History   86 yo unvaccinated male not feeling well for 9-10 days with increasing SOB. Found to be covid positive. Quickly deteriorated and required 15LPM per NRB. New secondary  bacterial pneumonia per CT 9/14, suspect aspiration. Now  tiring out , and decompensating, increased WOB and drop in sats. PCCM asked to intubate transfer to ICU and manage care.  Past Medical History  PMH DM, HLD, HTN, lumbar fusion.  Significant Hospital Events   9/2>>Chest x-ray: Patchy bilateral lung infiltrates 9/5>> renal ultrasound: Increased cortical echogenicity of both kidneys suggesting underlying medical renal disease, no hydronephrosis. 9/6>> chest x-ray: Stable bilateral lung opacities-concerning for multifocal pneumonia 9/9>> diffuse bilateral interstitial changes 9/9>> worsening hypoxemia-transition to heated high flow with NRB 9/10>> chest x-ray: Slight improvement in bilateral airspace disease when compared with the prior study 9/14>> Patient with worsening hypoxia and increased work of breathing, intubated and transferred to the ICU 9/15 on maximized pressors. Now in MODS w/ worsening cr and on-going hyperkalemia.  9/15> Nephrology consulted for worsening AKI and Hyperkalemia. CRRT started 9/15> Remained hypotensive on vasopressin, Neo and max Norepinephrine.Vasopressin increased to 0.04, s/p Albumin administration due to low CVP of 12.  9/16 now w/ SBP in 50s. Entire body is mottled. Family was in last evening. Orders written to stop CRRT as has refractory hypotension and is actively dying in spite of aggressive care and max pressors.    Consults:  9/14 PCCM  Procedures:  9/14 >> Intubation 9/14>>Central line placement via Femoral vein 9/14> Arterial line placement 9/15> Right IJ Trialysis  catheter placed  Significant Diagnostic Tests:   9/14> CT Chest consistent with atypical/viral pneumonia such as COVID pneumonia.Dense airspace consolidation in the left lower lobe could be secondary bacterial pneumonia or possible aspiration.  9/14 >CT Abdomen and Pelvis No acute abdominal/pelvic findings or adenopathy. Partially calcified mesenteric lesion, possibly a benign process such as mesenteric infarct or other remote mesenteric inflammatory process. A carcinoid tumor would be another possibility.Cholelithiasis.Aortic atherosclerosis.  9/14>CXR interstitial prominence with bibasilar airspace opacities most compatible with pneumonia. No change since prior study 9/14>KUB Bowel gas pattern is unremarkable. LEFT flank is however excluded from view. Stool and gas in the RIGHT lower quadrant.   Micro Data:  SARS-CoV-2 PCR  9/14>> Positive Blood culture x2 9/14>> Sputum 9/14>>mod gpc and gnr>>> uc 9/14 < 10K Antimicrobials:  Unasyn 9/14>>  9/14 Zosyn 9/15>>> Remdesivir 9/14>>completed  Azithromycin 9/14>>completed  zyvox 9/15>>>9/17  Interim history/subjective:  Looks physically worse.  Objective   Blood pressure (Abnormal) 83/46, pulse 75, temperature (Abnormal) 93.7 F (34.3 C), temperature source Axillary, resp. rate 18, height 5\' 6"  (1.676 m), weight 84.5 kg, SpO2 91 %. CVP:  [7 mmHg-10 mmHg] 8 mmHg  Vent Mode: PRVC FiO2 (%):  [100 %] 100 % Set Rate:  [30 bmp] 30 bmp Vt Set:  [380 mL] 380 mL PEEP:  [15 cmH20] 15 cmH20 Plateau Pressure:  [29 cmH20-36 cmH20] 29 cmH20   Intake/Output Summary (Last 24 hours) at 26-Mar-2020 0841 Last data filed at 26-Mar-2020 0800 Gross per 24 hour  Intake 7195.54 ml  Output 71 ml  Net 7124.54 ml   Filed Weights   02/26/20 0800 03/10/20 0452 2020-03-26 0404  Weight: 85 kg 79 kg 84.5 kg    Examination: General this is a 86 year old male w/ multiple  organ failure and refractory shock. He appears to be slowly but actively dying HENT  sclera are icteric, has worsening scleral edema orally intubated. Mucous membranes are dry Pulm decreased t/o. Still on 100% and 15 PEEP pplat 32  Card Regular  abd soft no bowel sounds Ext cold and mottled. Can't palp pulses Neuro unresponsive gu no UOP  Resolved Hospital Problem list     Assessment & Plan:   Acute Hypoxic Hypercapneic Respiratory Failure secondary to Covid-19 pneumonia  bacterial pneumonia (NOS): this was the complicating factor and responsible for his acute decline Sepsis shock/MODS w/ lactic acidosis: refractory to aggressive hemodynamic support, stress dose steroids and renal replacement  Acute on chronic Kidney Injury now with worsening creatine and hyperkalemia: no longer tolerating CRRT hemodynamically Acute metabolic  Encephalopathy: in the setting of severe illness  Uncontrolled Diabetes mellitus: Recent HgbA1c 7.0  Discussion He is actively dying w/ refractory hypotension and shock in spite of all aggressive therapy provided. At this point he is NOT tolerating even to slow CRRT flow rates hemodynamically so I have discontinued this therapy.   Plan Cont current supportive care No further titration of pressors (we do not have any room for this->he is maxed out). Will dc neo-->not adding any thing here. Also change vasopressin to 0.3 (shock dosing) Cont full vent support KVO IVFs Hold tubefeeds (clinically looks like he has ileus) Cont stress dose steroids Stop zyvox Cont day 4 zosyn No longer Prone positioning candidate Cont heavy sedation    Suspect that he will pass away today   Best practice:  Diet: NPO-->tubefeeds Pain/Anxiety/Delirium protocol (if indicated): Fentanyl  VAP protocol (if indicated): Initiated DVT prophylaxis: Lovenox GI prophylaxis: Protonix Glucose control: CBG with SSI Mobility: BR Code Status: DNR Family Communication: Dr. Kennon Santos updated son Disposition: ICU  My cct 34 minutes  Brandon Santos ACNP-BC [place]

## 2020-03-25 NOTE — Death Summary Note (Signed)
DEATH SUMMARY   Patient Details  Name: Geral Coker MRN: [id number] DOB: Sep 15, 1933  Admission/Discharge Information   Admit Date:  02/26/20  Date of Death: Date of Death: 2020/03/12  Time of Death: Time of Death: 09/16/02  Length of Stay: 09-08-22  Referring Physician: Vivi Barrack, MD   Reason(s) for Hospitalization  COVID 2022-09-12  Diagnoses  Preliminary cause of death:   COVID 2022/09/12 Secondary Diagnoses (including complications and co-morbidities):  Principal Problem:   Acute hypoxemic respiratory failure due to COVID-19 [facility]) Active Problems:   Diabetes ([place])   Hypertension associated with diabetes ([place])   Hyperlipidemia associated with type 2 diabetes mellitus ([place])   Acute respiratory failure ([place])   AKI (acute kidney injury) ([place])   Malnutrition of moderate degree   Pressure injury of skin Septic shock  Brief Hospital Course (including significant findings, care, treatment, and services provided and events leading to death)  86 yo unvaccinated male not feeling well for 9-10 days with increasing SOB. Found to be covid positive. He received remdesivir, steroids, actemra shortly after admission.  Quickly deteriorated and required 15LPM per NRB. New secondary  bacterial pneumonia per CT 9/14, suspect aspiration. Now  tiring out , and decompensating, increased WOB and drop in sats. PCCM asked to intubate, transfer to ICU and manage care on 9/14.    Hospital significant events.  9/2>>Chest x-ray: Patchy bilateral lung infiltrates 9/5>> renal ultrasound: Increased cortical echogenicity of both kidneys suggesting underlying medical renal disease, no hydronephrosis. 9/6>> chest x-ray: Stable bilateral lung opacities-concerning for multifocal pneumonia 9/9>> diffuse bilateral interstitial changes 9/9>> worsening hypoxemia-transition to heated high flow with NRB 9/10>> chest x-ray: Slight improvement in bilateral airspace disease when compared with the prior study 9/14>> Patient with worsening  hypoxia and increased work of breathing, intubated and transferred to the ICU 9/15 on maximized pressors. Now in MODS w/ worsening cr and on-going hyperkalemia.  9/15> Nephrology consulted for worsening AKI and Hyperkalemia. CRRT started 9/15> Remained hypotensive on vasopressin, Neo and max Norepinephrine.Vasopressin increased to 0.04, s/p Albumin administration due to low CVP of 12.  9/16 now w/ SBP in 50s. Entire body is mottled. Family was in last evening. Orders written to stop CRRT as has refractory hypotension and is actively dying in spite of aggressive care and max pressors. Multiple conversations were held with the patient's son advising that Mr. Seals had a condition which was incurable and he would not survive.  He voiced understanding and visited. Palliative care measures were put in place and he was compassionately extubated on 03-13-23 after family visitation.    Pertinent Labs and Studies  Significant Diagnostic Studies CT ABDOMEN PELVIS WO CONTRAST  Result Date: 03/08/2020 CLINICAL DATA:  Abdominal pain and leukocytosis. COVID-19 positive. EXAM: CT CHEST, ABDOMEN AND PELVIS WITHOUT CONTRAST TECHNIQUE: Multidetector CT imaging of the chest, abdomen and pelvis was performed following the standard protocol without IV contrast. COMPARISON:  None. FINDINGS: CT CHEST FINDINGS Cardiovascular: The heart is normal in size. No pericardial effusion. There is moderate tortuosity, mild ectasia and moderate atherosclerotic calcification involving the thoracic aorta. Three-vessel coronary artery calcifications are noted. Mediastinum/Nodes: Small scattered mediastinal and hilar lymph nodes but no mass or overt adenopathy. The esophagus is grossly normal. Lungs/Pleura: Patchy largely peripheral ground-glass type infiltrates throughout both lungs consistent with atypical/viral pneumonia such as COVID pneumonia. There is also dense airspace consolidation in the left lower lobe which may be a secondary  bacterial pneumonia or possible aspiration. No pleural effusion. No worrisome pulmonary lesions. The central  tracheobronchial tree is unremarkable. Musculoskeletal: No significant bony findings. CT ABDOMEN PELVIS FINDINGS Hepatobiliary: No hepatic lesions are identified without contrast. There is a gallstone noted in the gallbladder but no CT findings to suggest acute cholecystitis. Normal caliber common bile duct. Pancreas: No mass, inflammation or ductal dilatation. Spleen: Normal size. No focal lesions. Adrenals/Urinary Tract: The adrenal glands are unremarkable. Left renal cysts but no worrisome renal lesions or renal calculi. No hydronephrosis. The bladder is unremarkable. Stomach/Bowel: The stomach, duodenum, small bowel and colon are unremarkable. No acute inflammatory process, mass lesions or obstructive findings. Fairly significantly distended rectum with contrast stool and air. Vascular/Lymphatic: Atherosclerotic calcifications involving the aorta iliac arteries but no aneurysm. There is a partially calcified mesenteric lesion. I do not see a discrete solid nodule but carcinoid tumor would certainly be a pelvis ability. Is also possible this is an old focus of mesenteric infarct or other prior mesenteric inflammatory process. Recommend clinical correlation. Reproductive: The prostate gland and seminal vesicles are unremarkable. There is mild median lobe hypertrophy of the prostate gland with mild impression on the base of the bladder. Other: No pelvic mass or pelvic adenopathy. No free pelvic fluid collections. Musculoskeletal: No significant bony findings. L4-5 lumbar fusion changes are noted. IMPRESSION: 1. Changes consistent with atypical/viral pneumonia such as COVID pneumonia. 2. Dense airspace consolidation in the left lower lobe could be a secondary bacterial pneumonia or possible aspiration. 3. No acute abdominal/pelvic findings or adenopathy. 4. Partially calcified mesenteric lesion, possibly a  benign process such as mesenteric infarct or other remote mesenteric inflammatory process. A carcinoid tumor would be another possibility. 5. Cholelithiasis. 6. Aortic atherosclerosis. Aortic Atherosclerosis (ICD10-I70.0). Electronically Signed   By: Marijo Sanes M.D.   On: 03/08/2020 12:41   DG Abd 1 View  Result Date: 03/08/2020 CLINICAL DATA:  Nasogastric tube placement. EXAM: ABDOMEN - 1 VIEW COMPARISON:  March 08, 2020 FINDINGS: A nasogastric tube is seen with its distal tip overlying the expected region of the body of the stomach. Radiopaque contrast is seen within the large bowel. The bowel gas pattern is normal. No radio-opaque calculi or other significant radiographic abnormality are seen. Bilateral radiopaque pedicle screws are noted within the lower lumbar spine. IMPRESSION: Nasogastric tube positioning, as described above. Electronically Signed   By: Virgina Norfolk M.D.   On: 03/08/2020 20:33   CT CHEST WO CONTRAST  Result Date: 03/08/2020 CLINICAL DATA:  Abdominal pain and leukocytosis. COVID-19 positive. EXAM: CT CHEST, ABDOMEN AND PELVIS WITHOUT CONTRAST TECHNIQUE: Multidetector CT imaging of the chest, abdomen and pelvis was performed following the standard protocol without IV contrast. COMPARISON:  None. FINDINGS: CT CHEST FINDINGS Cardiovascular: The heart is normal in size. No pericardial effusion. There is moderate tortuosity, mild ectasia and moderate atherosclerotic calcification involving the thoracic aorta. Three-vessel coronary artery calcifications are noted. Mediastinum/Nodes: Small scattered mediastinal and hilar lymph nodes but no mass or overt adenopathy. The esophagus is grossly normal. Lungs/Pleura: Patchy largely peripheral ground-glass type infiltrates throughout both lungs consistent with atypical/viral pneumonia such as COVID pneumonia. There is also dense airspace consolidation in the left lower lobe which may be a secondary bacterial pneumonia or possible  aspiration. No pleural effusion. No worrisome pulmonary lesions. The central tracheobronchial tree is unremarkable. Musculoskeletal: No significant bony findings. CT ABDOMEN PELVIS FINDINGS Hepatobiliary: No hepatic lesions are identified without contrast. There is a gallstone noted in the gallbladder but no CT findings to suggest acute cholecystitis. Normal caliber common bile duct. Pancreas: No mass, inflammation or ductal dilatation.  Spleen: Normal size. No focal lesions. Adrenals/Urinary Tract: The adrenal glands are unremarkable. Left renal cysts but no worrisome renal lesions or renal calculi. No hydronephrosis. The bladder is unremarkable. Stomach/Bowel: The stomach, duodenum, small bowel and colon are unremarkable. No acute inflammatory process, mass lesions or obstructive findings. Fairly significantly distended rectum with contrast stool and air. Vascular/Lymphatic: Atherosclerotic calcifications involving the aorta iliac arteries but no aneurysm. There is a partially calcified mesenteric lesion. I do not see a discrete solid nodule but carcinoid tumor would certainly be a pelvis ability. Is also possible this is an old focus of mesenteric infarct or other prior mesenteric inflammatory process. Recommend clinical correlation. Reproductive: The prostate gland and seminal vesicles are unremarkable. There is mild median lobe hypertrophy of the prostate gland with mild impression on the base of the bladder. Other: No pelvic mass or pelvic adenopathy. No free pelvic fluid collections. Musculoskeletal: No significant bony findings. L4-5 lumbar fusion changes are noted. IMPRESSION: 1. Changes consistent with atypical/viral pneumonia such as COVID pneumonia. 2. Dense airspace consolidation in the left lower lobe could be a secondary bacterial pneumonia or possible aspiration. 3. No acute abdominal/pelvic findings or adenopathy. 4. Partially calcified mesenteric lesion, possibly a benign process such as mesenteric  infarct or other remote mesenteric inflammatory process. A carcinoid tumor would be another possibility. 5. Cholelithiasis. 6. Aortic atherosclerosis. Aortic Atherosclerosis (ICD10-I70.0). Electronically Signed   By: Marijo Sanes M.D.   On: 03/08/2020 12:41   US RENAL  Result Date: 03/08/2020 CLINICAL DATA:  Acute renal failure, hypertension, diabetes EXAM: RENAL / URINARY TRACT ULTRASOUND COMPLETE COMPARISON:  CT abdomen pelvis 03/08/2020. ultrasound renal 02/28/2020 FINDINGS: Right Kidney: Renal measurements: 9.7 x 4.6 x 5.6 = volume: 124 mL. Increased parenchymal echogenicity. No mass or hydronephrosis visualized. Left Kidney: Renal measurements: 9 x 4.9 x 4.7 = volume: 109 mL. Increased parenchymal echogenicity. There is a stable 2.1 x 1.8 x 1.7 cm anechoic lesion within the left kidney that is consistent with a simple renal cyst. No solid mass or hydronephrosis visualized. Urinary bladder: The urinary bladder is decompressed with a Foley catheter balloon identified within its lumen. Other: None. IMPRESSION: 1. Atrophic kidneys with increased renal echogenicity bilaterally suggestive of chronic renal parenchymal disease. 2. Otherwise unremarkable renal ultrasound with urinary bladder Foley. Electronically Signed   By: Iven Finn M.D.   On: 03/08/2020 20:51   US RENAL  Result Date: 02/28/2020 CLINICAL DATA:  Acute renal failure EXAM: RENAL / URINARY TRACT ULTRASOUND COMPLETE COMPARISON:  None. FINDINGS: Right Kidney: Renal measurements: 9.2 x 5.2 x 5.0 cm = volume: 125 mL. Renal cortical echogenicity is diffusely increased suggesting changes of underlying medical renal disease. There is mild diffuse renal cortical atrophy. No hydronephrosis. No intrarenal masses or calcifications. Left Kidney: Renal measurements: 9.1 x 5.2 x 4.5 cm = volume: 106 mL. Renal cortical echogenicity is diffusely increased suggesting changes of underlying medical renal disease. There is mild diffuse renal cortical atrophy.  No hydronephrosis. No intrarenal masses or calcifications. 2 cm simple cortical cyst arises exophytically from the lower pole. Bladder: Is decompressed and is not well visualized. Other: None. IMPRESSION: 1. Increased cortical echogenicity of both kidneys suggesting changes of underlying medical renal disease. 2. Mild diffuse renal cortical atrophy. Electronically Signed   By: Fidela Salisbury MD   On: 02/28/2020 16:11   DG Chest Port 1 View  Result Date: 03/10/2020 CLINICAL DATA:  Acute respiratory failure EXAM: PORTABLE CHEST 1 VIEW COMPARISON:  03/09/2020 FINDINGS: Interval removal of NG tube.  Remainder of the support devices are stable. Heart is normal size. Airspace disease in the mid and lower lungs bilaterally, left greater than right, worsening on the left since prior study. No effusions or pneumothorax. No acute bony abnormality. IMPRESSION: Bilateral airspace opacities, worsening on the left since prior study. Electronically Signed   By: Rolm Baptise M.D.   On: 03/10/2020 11:15   DG Chest Port 1 View  Result Date: 03/09/2020 CLINICAL DATA:  Central line placement. EXAM: PORTABLE CHEST 1 VIEW COMPARISON:  March 09, 2020. FINDINGS: The heart size and mediastinal contours are within normal limits. Endotracheal and nasogastric tubes are unchanged in position. Left internal jugular catheter is unchanged. Interval placement of right internal jugular catheter with distal tip in expected position of the SVC. No pneumothorax or pleural effusion is noted. Mild bibasilar atelectasis or infiltrates are noted. The visualized skeletal structures are unremarkable. IMPRESSION: Interval placement of right internal jugular catheter with distal tip in expected position of the SVC. No pneumothorax is noted. Mild bibasilar atelectasis or infiltrates are noted. Electronically Signed   By: Marijo Conception M.D.   On: 03/09/2020 14:20   DG CHEST PORT 1 VIEW  Result Date: 03/09/2020 CLINICAL DATA:  Hypoxia. EXAM:  PORTABLE CHEST 1 VIEW COMPARISON:  March 08, 2020 FINDINGS: There is stable endotracheal tube and left internal jugular venous catheter positioning. Interval nasogastric tube placement is noted with its distal tip overlying the body of the stomach. Mild, stable bibasilar atelectasis and/or infiltrate is noted. There is no evidence of a pleural effusion or pneumothorax. The heart size and mediastinal contours are within normal limits. Degenerative changes seen within the thoracic spine. IMPRESSION: 1. Mild, stable bibasilar atelectasis and/or infiltrate. 2. Interval nasogastric tube placement positioning, as described above. Electronically Signed   By: Virgina Norfolk M.D.   On: 03/09/2020 00:46   DG CHEST PORT 1 VIEW  Result Date: 03/08/2020 CLINICAL DATA:  Hypoxia EXAM: PORTABLE CHEST 1 VIEW COMPARISON:  March 08, 2020 chest radiograph obtained earlier in the day FINDINGS: Endotracheal tube tip is 2.9 cm above the carina. Central catheter tip is in the superior vena cava. No pneumothorax. There is consolidation in the left lower lobe with patchy airspace opacity in each lung base. Heart size and pulmonary vascularity are normal. No adenopathy. No bone lesions. IMPRESSION: Tube and catheter positions as described without pneumothorax. Consolidation left base with more patchy airspace opacity in each lower lung region, similar to earlier in the day. Stable cardiac silhouette. Electronically Signed   By: Lowella Grip III M.D.   On: 03/08/2020 19:22   DG Chest Port 1 View  Result Date: 03/08/2020 CLINICAL DATA:  COVID EXAM: PORTABLE CHEST 1 VIEW COMPARISON:  03/04/2020 FINDINGS: Bilateral lower lobe airspace opacities are again noted, unchanged. Diffuse interstitial prominence. Heart is normal size. No effusions or pneumothorax. IMPRESSION: Interstitial prominence with bibasilar airspace opacities most compatible with pneumonia. No change since prior study. Electronically Signed   By: Rolm Baptise M.D.   On: 03/08/2020 08:45   DG Chest Port 1 View  Result Date: 03/03/2020 CLINICAL DATA:  Shortness of breath.  COVID. EXAM: PORTABLE CHEST 1 VIEW COMPARISON:  02/29/2020. FINDINGS: Mediastinum hilar structures are unremarkable. Heart size normal. Low lung volumes. Diffuse prominent bilateral interstitial infiltrates again noted. Similar findings noted on prior exam. No prominent pleural effusion. No pneumothorax. Degenerative change thoracic spine. IMPRESSION: Low lung volumes. Diffuse prominent bilateral interstitial filtrates again noted. Similar findings noted on prior exam. Electronically Signed  By: Rayle   On: 03/03/2020 07:04   DG Chest Port 1 View  Result Date: 02/29/2020 CLINICAL DATA:  Shortness of breath, COVID-19. EXAM: PORTABLE CHEST 1 VIEW COMPARISON:  February 25, 2020. FINDINGS: Stable cardiomediastinal silhouette. No pneumothorax or pleural effusion is noted. Stable bilateral lung opacities are noted concerning for multifocal pneumonia due to COVID-19. Bony thorax is unremarkable. IMPRESSION: Stable bilateral lung opacities are noted concerning for multifocal pneumonia due to COVID-19. Electronically Signed   By: Marijo Conception M.D.   On: 02/29/2020 08:13   DG Chest Port 1 View  Result Date: 03/23/2020 CLINICAL DATA:  COVID shortness of breath EXAM: PORTABLE CHEST 1 VIEW COMPARISON:  January 14, 2014. FINDINGS: Trachea midline. Cardiomediastinal contours are stable. Interval development of patchy basilar and mid lung predominant opacities in the LEFT and RIGHT chest both interstitial and airspace component. Process more pronounced at the LEFT lung base. Visualized skeletal structures on limited assessment without acute process. No sign of pleural effusion. IMPRESSION: Patchy basilar and mid lung predominant opacities in the LEFT and RIGHT chest both interstitial and airspace component, findings could be seen in the setting of viral or atypical pneumonia, including  COVID-19 infection. Electronically Signed   By: Zetta Bills M.D.   On: 03/02/2020 08:12   DG Chest Port 1V same Day  Result Date: 03/04/2020 CLINICAL DATA:  COVID-19 positivity with hypoxia EXAM: PORTABLE CHEST 1 VIEW COMPARISON:  03/03/2020 FINDINGS: Cardiac shadow is stable. Bibasilar opacities are noted consistent with the given clinical history. The overall appearance is slightly improved when compared with the prior study. No bony abnormality is noted. IMPRESSION: Slight improvement in bilateral airspace disease when compared with the prior study. Electronically Signed   By: Inez Catalina M.D.   On: 03/04/2020 08:09   DG Abd Portable 1V  Result Date: 03/08/2020 CLINICAL DATA:  COVID 19 infection, history of diabetes EXAM: PORTABLE ABDOMEN - 1 VIEW COMPARISON:  Chest x-ray of the same date. FINDINGS: Bowel gas pattern is unremarkable. LEFT flank is however excluded from view. Stool and gas in the RIGHT lower quadrant. No abnormal calcifications.  No sign of organomegaly. Signs of L4-5 spinal fusion. No acute skeletal process on limited assessment. IMPRESSION: No signs of bowel obstruction or acute process on supine radiograph. Electronically Signed   By: Zetta Bills M.D.   On: 03/08/2020 08:47    Microbiology Recent Results (from the past 240 hour(s))  MRSA PCR Screening     Status: None   Collection Time: 03/03/20  5:57 AM   Specimen: Nasal Mucosa; Nasopharyngeal  Result Value Ref Range Status   MRSA by PCR NEGATIVE NEGATIVE Final    Comment:        The GeneXpert MRSA Assay (FDA approved for NASAL specimens only), is one component of a comprehensive MRSA colonization surveillance program. It is not intended to diagnose MRSA infection nor to guide or monitor treatment for MRSA infections. Performed at [facility], [place] [address], [place], [place] [address]   Culture, Urine     Status: Abnormal   Collection Time: 03/08/20  6:36 AM   Specimen: Urine, Random  Result  Value Ref Range Status   Specimen Description URINE, RANDOM  Final   Special Requests NONE  Final   Culture (A)  Final    <10,000 COLONIES/mL INSIGNIFICANT GROWTH Performed at [facility], [place] [address] [place], [place] [address]    Report Status 03/09/2020 FINAL  Final  Culture, respiratory  Status: None   Collection Time: 03/08/20  6:31 PM   Specimen: Tracheal Aspirate; Respiratory  Result Value Ref Range Status   Specimen Description TRACHEAL ASPIRATE  Final   Special Requests NONE  Final   Gram Stain   Final    NO WBC SEEN MODERATE GRAM POSITIVE COCCI FEW GRAM NEGATIVE COCCI    Culture   Final    Normal respiratory flora-no Staph aureus or Pseudomonas seen Performed at [facility], [address] [place], [place] [address]    Report Status April 07, 2020 FINAL  Final  Culture, blood (routine x 2)     Status: None (Preliminary result)   Collection Time: 03/08/20  9:00 PM   Specimen: BLOOD  Result Value Ref Range Status   Specimen Description BLOOD  Final   Special Requests   Final    BOTTLES DRAWN AEROBIC ONLY Blood Culture adequate volume   Culture   Final    NO GROWTH 3 DAYS Performed at [place] [address], [place], [place] [address]    Report Status PENDING  Incomplete  Culture, blood (routine x 2)     Status: None (Preliminary result)   Collection Time: 03/08/20  9:05 PM   Specimen: BLOOD  Result Value Ref Range Status   Specimen Description BLOOD RIGHT THUMB  Final   Special Requests   Final    BOTTLES DRAWN AEROBIC ONLY Blood Culture results may not be optimal due to an inadequate volume of blood received in culture bottles   Culture   Final    NO GROWTH 3 DAYS Performed at [facility], [place] [address], [place],  [address]    Report Status PENDING  Incomplete    Lab Basic Metabolic Panel: Recent Labs  Lab 03/09/20 0113 03/09/20 0113 03/09/20 0411 03/09/20 0413 03/09/20 1700 03/09/20 1849 03/10/20 0234  03/10/20 0503 03/10/20 1105 03/10/20 1605 04-07-2020 0404  NA 144  --   --    < > 143   < > 141 141 139 138 137  K 7.1*   < > 6.0*   < > 6.8*   < > 3.9 4.3 3.8 4.8 4.7  CL 105  --   --   --  105  --  103  --   --  104 105  CO2 21*  --   --   --  21*  --  19*  --   --  22 19*  GLUCOSE 245*   < > 201*  --  269*  --  125*  --   --  100* 114*  BUN 120*  --   --   --  124*  --  61*  --   --  29* 20  CREATININE 2.89*  --   --   --  3.65*  --  1.95*  --   --  1.22 1.06  CALCIUM 7.6*  --   --   --  7.6*  --  7.1*  --   --  6.7* 6.6*  MG  --   --   --   --  2.9*  --  2.6*  --   --  2.4 2.4  PHOS  --   --   --   --  >30.0*  --  6.8*  --   --  4.6 4.1   < > = values in this interval not displayed.   Liver Function Tests: Recent Labs  Lab 03/06/20 1746  03/06/20 1746 03/07/20 0500 03/07/20 0500 03/09/20 0113 03/09/20 1700 03/10/20 0234 03/10/20 1605 2020/03/27 0404  AST 22  --  21  --  31  --  45*  --  151*  ALT 99*  --  84*  --  58*  --  50*  --  96*  ALKPHOS 63  --  68  --  88  --  89  --  88  BILITOT 1.0  --  1.1  --  1.1  --  1.1  --  1.0  PROT 5.9*  --  6.2*  --  4.9*  --  5.2*  --  3.9*  ALBUMIN 2.7*   < > 2.8*   < > 2.2* 2.0* 2.4* 1.9* 1.8*   < > = values in this interval not displayed.   No results for input(s): LIPASE, AMYLASE in the last 168 hours. No results for input(s): AMMONIA in the last 168 hours. CBC: Recent Labs  Lab 03/05/20 0300 03/05/20 0300 03/06/20 1746 03/06/20 1746 03/07/20 0500 03/07/20 0500 03/08/20 0549 03/08/20 1742 03/09/20 0113 03/09/20 0413 03/09/20 0833 03/09/20 1849 03/10/20 0136 03/10/20 0503 03/10/20 1105  WBC 20.2*  --  23.4*  --  24.2*  --  49.1*  --  46.8*  --   --   --   --   --   --   HGB 15.3   < > 16.4   < > 17.0   < > 18.4*   < > 14.8   < > 14.3 13.6 14.6 13.6 12.9*  HCT 45.0   < > 49.4   < > 51.4   < > 54.4*   < > 47.9   < > 42.0 40.0 43.0 40.0 38.0*  MCV 86.9  --  87.3  --  89.2  --  87.7  --  93.4  --   --   --   --   --   --    PLT 368  --  335  --  330  --  231  --  207  --   --   --   --   --   --    < > = values in this interval not displayed.   Cardiac Enzymes: No results for input(s): CKTOTAL, CKMB, CKMBINDEX, TROPONINI in the last 168 hours. Sepsis Labs: Recent Labs  Lab 03/06/20 1746 03/07/20 0500 03/08/20 0549 03/08/20 1402 03/08/20 1905 03/09/20 0113 03/09/20 1109 03/09/20 1700 03/10/20 0234 03/10/20 1114 03/10/20 1405  PROCALCITON  --   --   --  0.30  --  0.86  --   --  0.90  --   --   WBC 23.4* 24.2* 49.1*  --   --  46.8*  --   --   --   --   --   LATICACIDVEN  --   --   --  6.2*   < >  --  6.0* 6.4*  --  7.4* 6.7*   < > = values in this interval not displayed.    Procedures/Operations  9/14 >> Intubation 9/14>>Central line placement via Femoral vein 9/14> Arterial line placement 9/14> HD cath R IJ   Brent Constantina Laseter 03/27/2020, 6:21 PM

## 2020-03-25 NOTE — Progress Notes (Signed)
[facility] notified of TOD. Full release of body to funeral home.

## 2020-03-25 NOTE — Progress Notes (Signed)
Time of death 66, verified by cardiac ascultation for 1 minute per protocol Devonne Doughty, RN and Pricilla Holm, RN.   Pt was accompanied by this RN until TOD, passed comfortably.   Ventilator turned off at 1253, pressor drips turned off at 1253. Fent and versed continued until TOD.

## 2020-03-25 NOTE — Progress Notes (Signed)
[facility] notified of plans to withdrawal care. Referral # [id number]. Call back with TOD.

## 2020-03-25 NOTE — Progress Notes (Addendum)
[facility] Progress Note  Name: Brandon Santos MRN: [id number] DOB: 20-Jul-1933  Subjective:  Has been on CRRT with goal of keep even - less than 100 mL UF charted over 9/16.  Anuric.  Critical care has spoken with his family and has communicated that he is not improving despite aggressive care and they recommend stopping CRRT at this point.  Has been maxed on levo, neo, and vaso.     Review of systems:  Unable to obtain 2/2 intubated  ------------------- Background on consult:  Brandon Santos is a 86 y.o. male with a history of hypertension, diabetes mellitus, and BPH who presented to the hospital with shortness of breath.  He was diagnosed with Covid pneumonia.  He received Actemra, Decadron, and remdesivir.  He represented to the ICU with hospital-acquired pneumonia and unfortunately his course has been complicated by intubation and multiorgan failure including acute kidney injury and ongoing hyperkalemia.  Nephrology is consulted for assistance with management of AKI and hyperkalemia.  Pulmonary does assess him as a candidate for CRRT and his son wants to pursue dialysis/RRT.   Pulmonology has discussed that this may be a last option for him to escalate care.  He has been on pressors.  Levo is at 55 mcg/min and vaso is on as of my exam.   Intake/Output Summary (Last 24 hours) at March 21, 2020 0818 Last data filed at Mar 21, 2020 0700 Gross per 24 hour  Intake 6957.79 ml  Output 82 ml  Net 6875.79 ml    Vitals:  Vitals:   Mar 21, 2020 0404 Mar 21, 2020 0500 03/21/20 0600 March 21, 2020 0700  BP:      Pulse:      Resp:  (!) 28 (!) 31 (!) 29  Temp:      TempSrc:      SpO2:      Weight: 84.5 kg     Height:         Physical Exam:   General: Adult male intubated critically ill  HEENT: Normocephalic atraumatic Heart S1-S2 no rub appreciated Lungs: Reduced coarse mechanical breath sounds Abdomen: Soft distended Extremities: No edema appreciated no cyanosis or clubbing Neuro: Sedation  running GU has a Foley Access nontunneled HD catheter  Medications reviewed   Labs:  BMP Latest Ref Rng & Units 03/21/20 03/10/2020 03/10/2020  Glucose 70 - 99 mg/dL 114(H) 100(H) -  BUN 8 - 23 mg/dL 20 29(H) -  Creatinine 0.61 - 1.24 mg/dL 1.06 1.22 -  BUN/Creat Ratio 6 - 22 (calc) - - -  Sodium 135 - 145 mmol/L 137 138 139  Potassium 3.5 - 5.1 mmol/L 4.7 4.8 3.8  Chloride 98 - 111 mmol/L 105 104 -  CO2 22 - 32 mmol/L 19(L) 22 -  Calcium 8.9 - 10.3 mg/dL 6.6(L) 6.7(L) -     Assessment/Plan:   # Hyperkalemia - improved with CRRT  # AKI  - multifactorial with ATN from hypotension/shock and setting of covid PNA and subsequent HCAP - has been on CRRT  # Acute hypoxic respiratory failure  - on mechanical ventilation per pulm  # covid PNA  - s/p therapies as above - per pulmonology   # Superimposed HCAP  - abx per primary team   # Septic shock  - Pressors per critical care  # Hypocalcemia - replete  Spoke with critical care.  Given his poor prognosis critical care recommends stopping the CRRT.  They are contacting his family regularly with updates and are having ongoing goals of care discussions.  Appreciate all teams  and staff involved with the care of this patient.  Nephrology will sign off.  Please do not hesitate to contact me if I can be of any assistance.   Claudia Desanctis, MD 03-18-2020  8:40 AM

## 2020-03-25 DEATH — deceased

## 2020-08-10 ENCOUNTER — Ambulatory Visit: Payer: BC Managed Care – PPO | Admitting: Family Medicine

## 2021-10-07 IMAGING — US US RENAL
1 series · 14 of 23 positions shown · non-contrast
Comparison: None.

CLINICAL DATA: Acute renal failure

EXAM:
RENAL / URINARY TRACT ULTRASOUND COMPLETE

[Series 1: us renal · 14 of 23 slices shown]
[im 1/23]
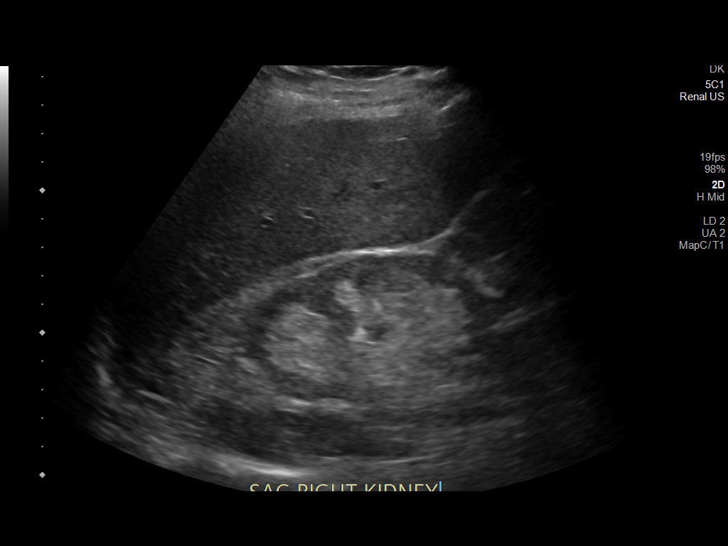
[im 3/23]
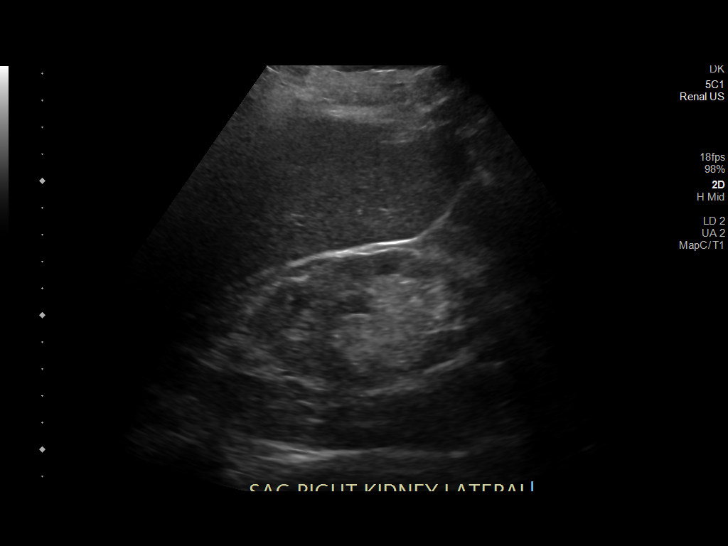
[im 5/23]
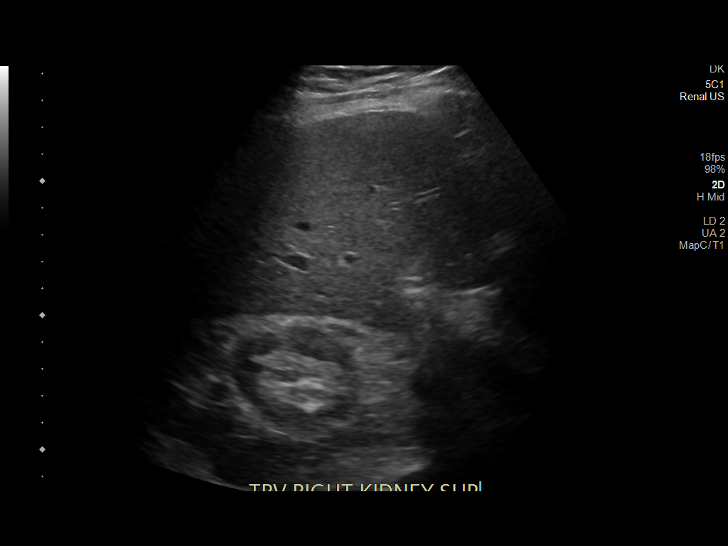
[im 6/23]
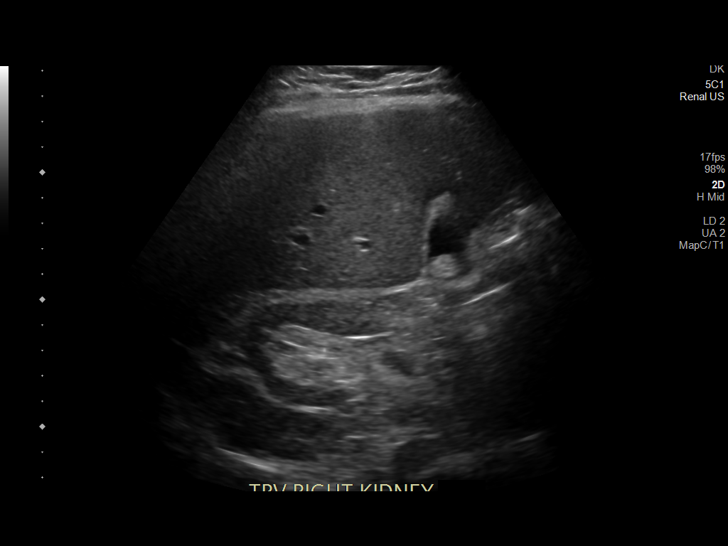
[im 8/23]
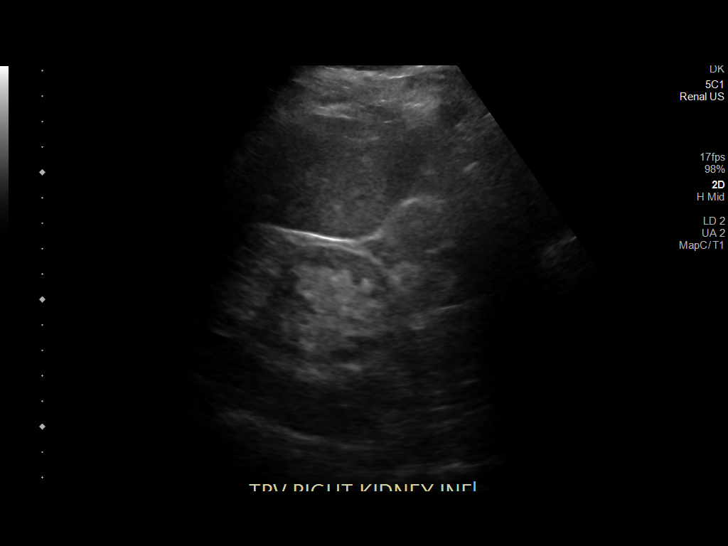
[im 10/23]
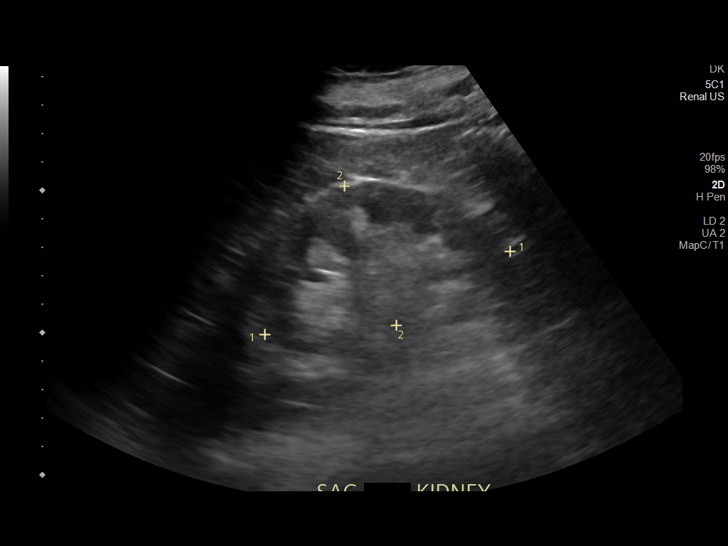
[im 11/23]
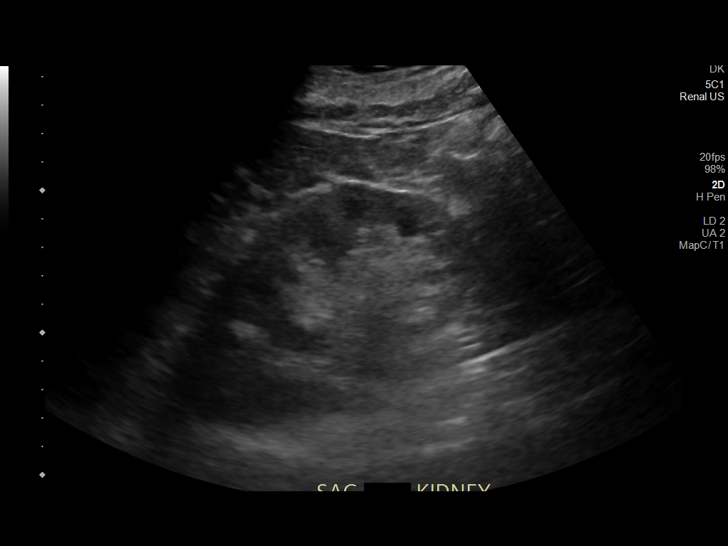
[im 13/23]
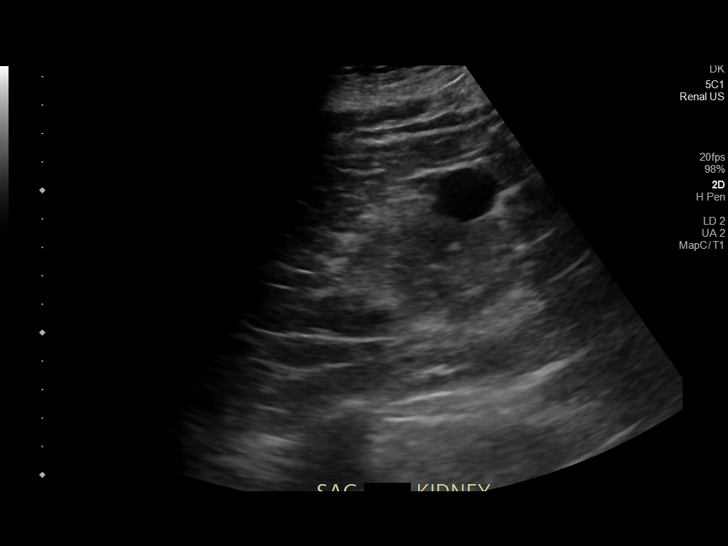
[im 14/23]
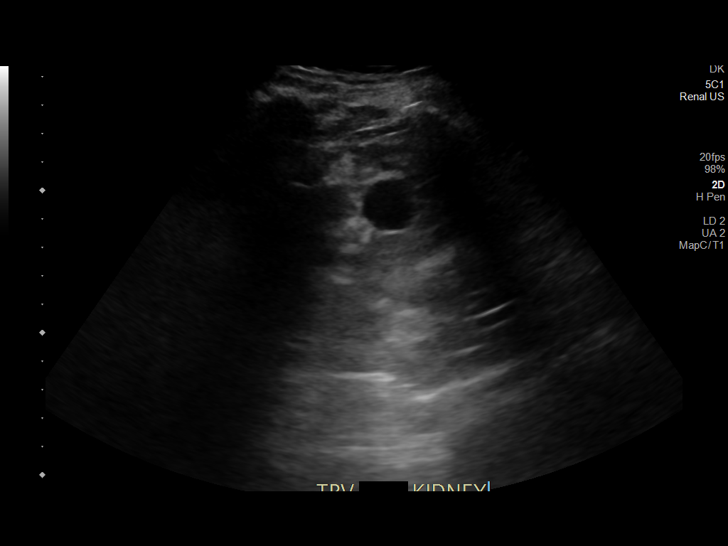
[im 16/23]
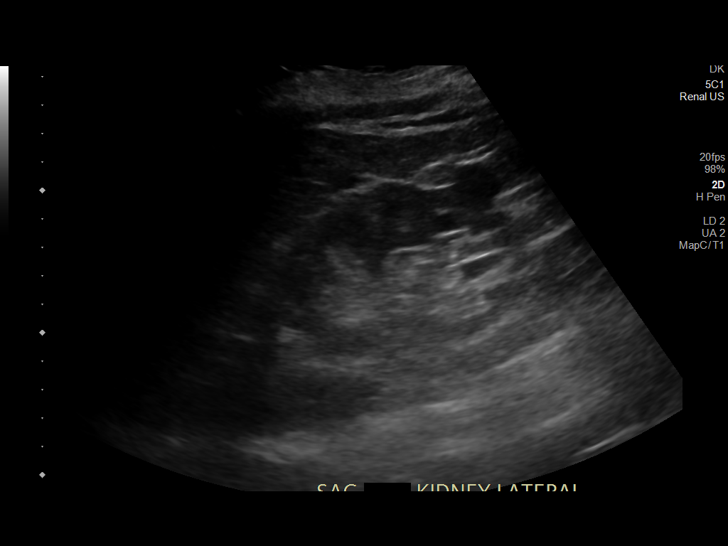
[im 18/23]
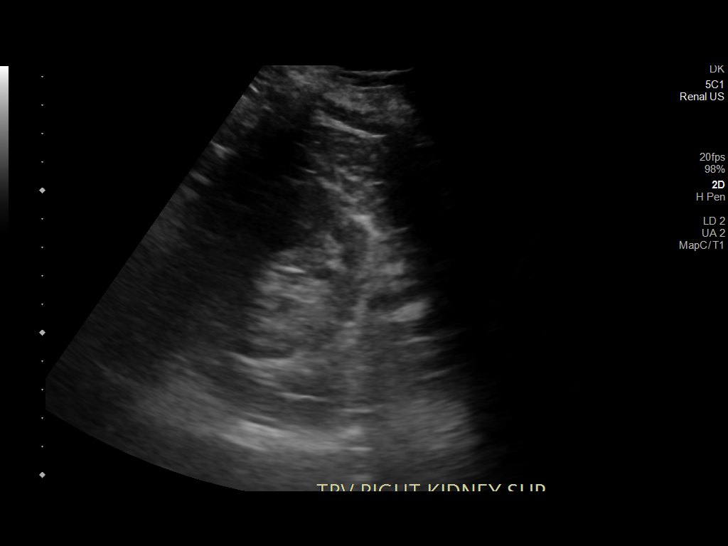
[im 19/23]
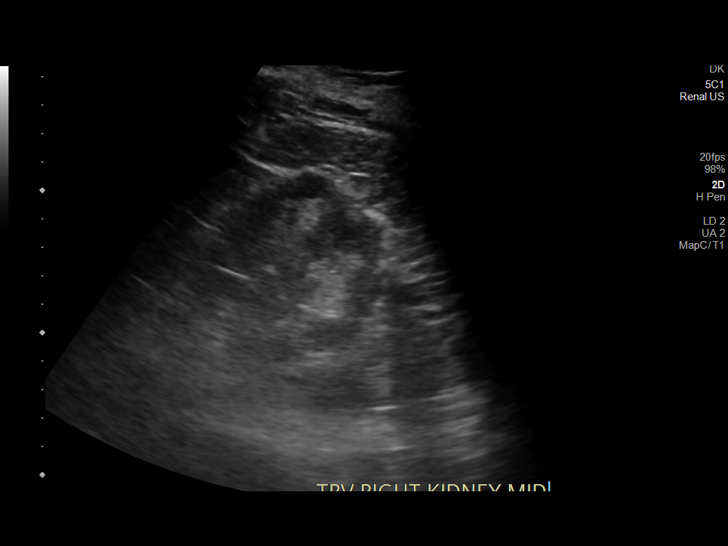
[im 21/23]
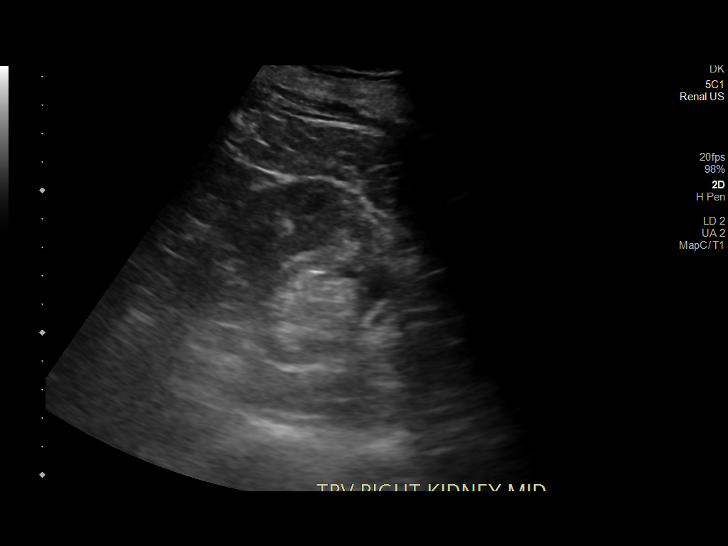
[im 23/23]
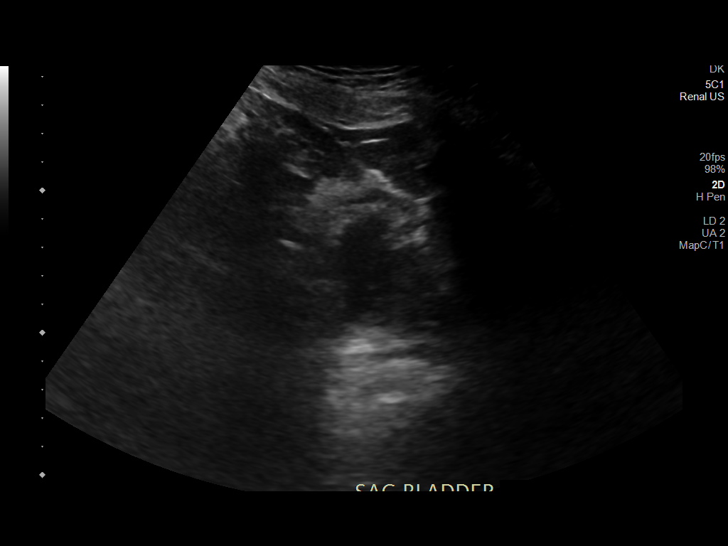

[14 of 23 positions shown; findings below may reference images not displayed]

FINDINGS: Right Kidney:

Renal measurements: 9.2 x 5.2 x 5.0 cm = volume: 125 mL. Renal
cortical echogenicity is diffusely increased suggesting changes of
underlying medical renal disease. There is mild diffuse renal
cortical atrophy. No hydronephrosis. No intrarenal masses or
calcifications.

Left Kidney:

Renal measurements: 9.1 x 5.2 x 4.5 cm = volume: 106 mL. Renal
cortical echogenicity is diffusely increased suggesting changes of
underlying medical renal disease. There is mild diffuse renal
cortical atrophy. No hydronephrosis. No intrarenal masses or
calcifications. 2 cm simple cortical cyst arises exophytically from
the lower pole.

Bladder:

Is decompressed and is not well visualized.

Other:

None.
IMPRESSION: 1. Increased cortical echogenicity of both kidneys suggesting
changes of underlying medical renal disease.
2. Mild diffuse renal cortical atrophy.

## 2021-10-11 IMAGING — DX DG CHEST 1V PORT
1 series · 1 of 1 positions shown · non-contrast
Comparison: 02/29/2020.

CLINICAL DATA: Shortness of breath.  COVID.

EXAM:
PORTABLE CHEST 1 VIEW

[chest ap]
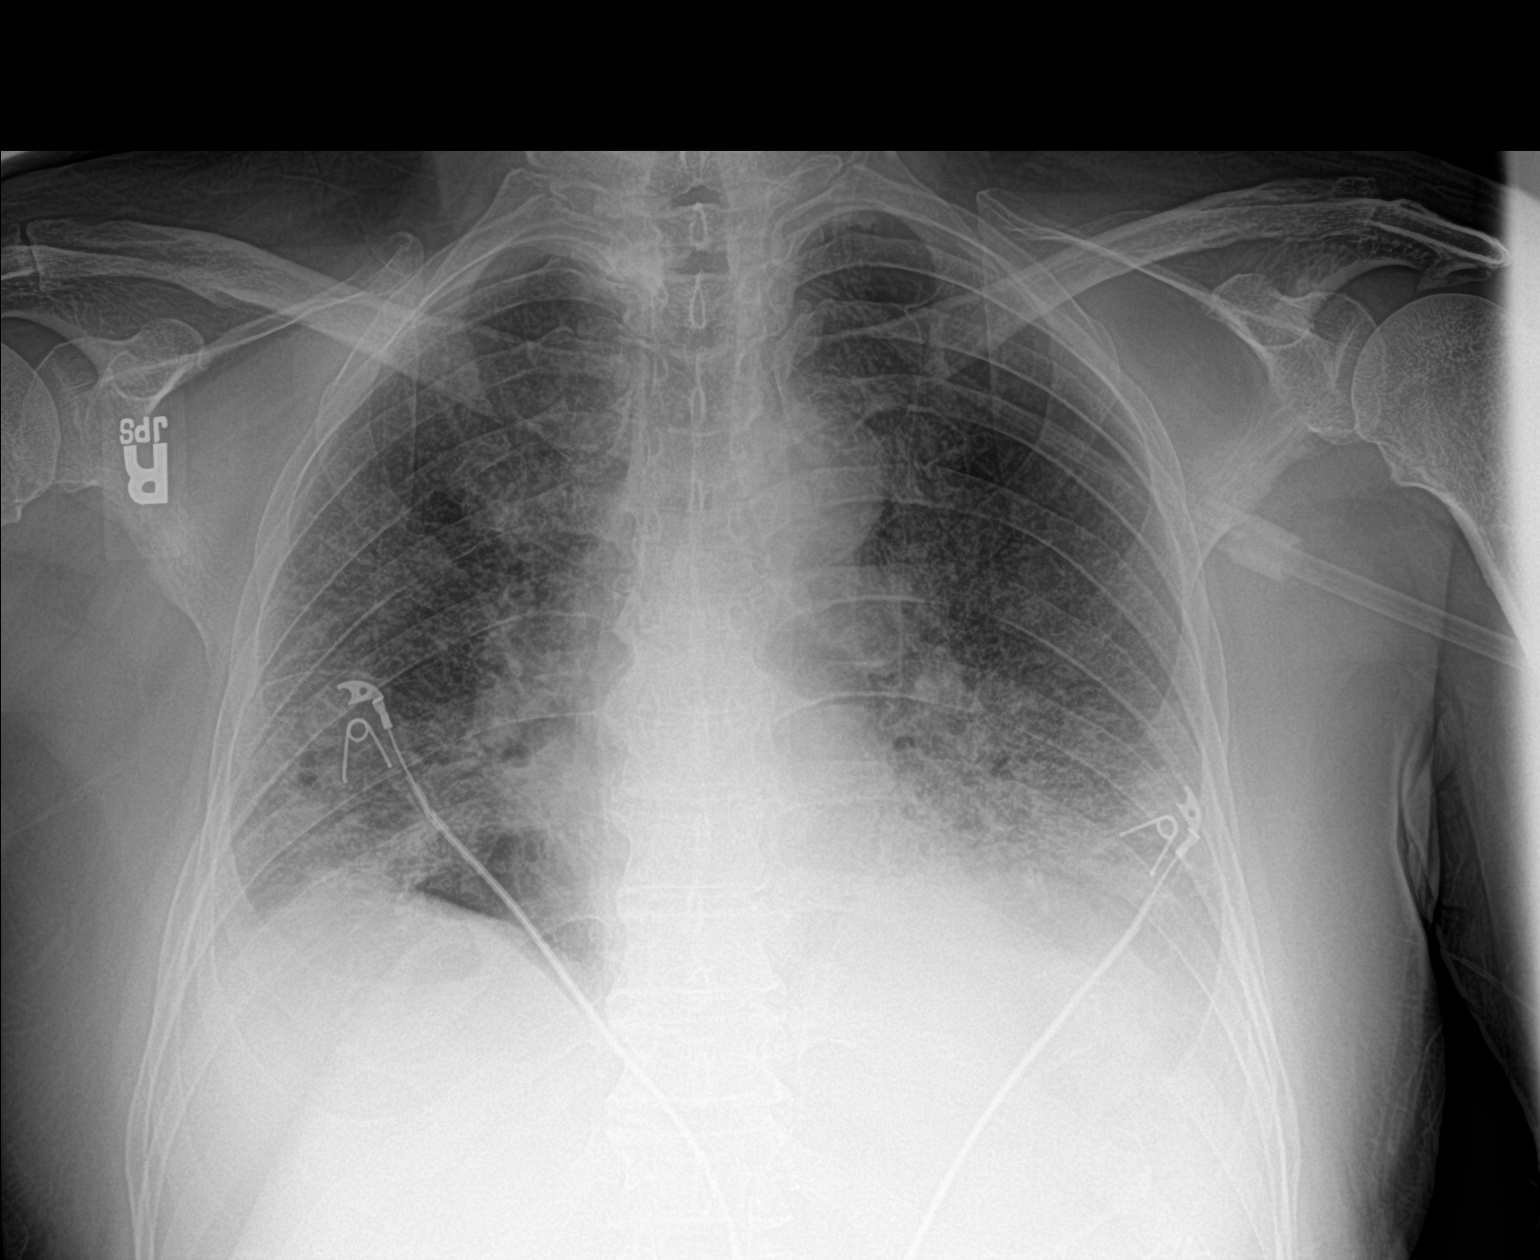

[1 of 1 positions shown; findings below may reference images not displayed]

FINDINGS: Mediastinum hilar structures are unremarkable. Heart size normal.
Low lung volumes. Diffuse prominent bilateral interstitial
infiltrates again noted. Similar findings noted on prior exam. No
prominent pleural effusion. No pneumothorax. Degenerative change
thoracic spine.
IMPRESSION: Low lung volumes. Diffuse prominent bilateral interstitial filtrates
again noted. Similar findings noted on prior exam.

## 2021-10-16 IMAGING — DX DG CHEST 1V PORT
1 series · 1 of 1 positions shown · non-contrast
Comparison: March 08, 2020 chest radiograph obtained earlier in
the day

CLINICAL DATA: Hypoxia

EXAM:
PORTABLE CHEST 1 VIEW

[chest ap]
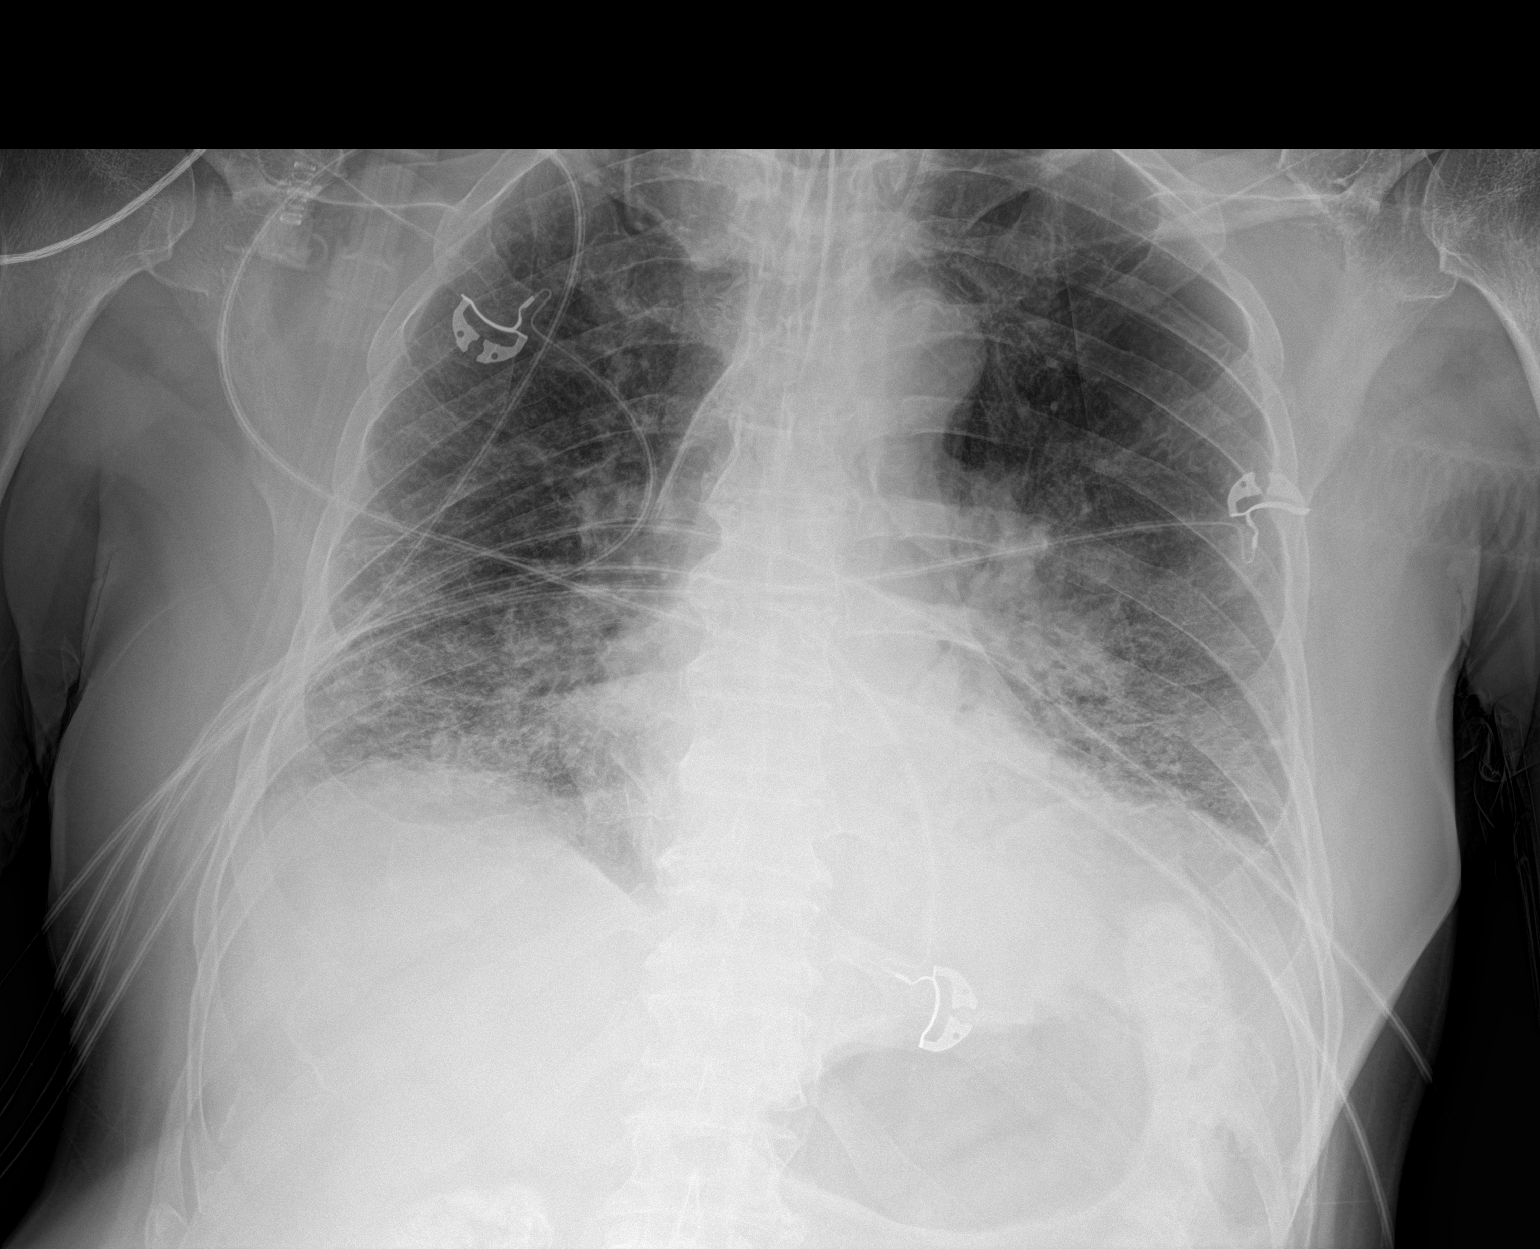

[1 of 1 positions shown; findings below may reference images not displayed]

FINDINGS: Endotracheal tube tip is 2.9 cm above the carina. Central catheter
tip is in the superior vena cava. No pneumothorax. There is
consolidation in the left lower lobe with patchy airspace opacity in
each lung base. Heart size and pulmonary vascularity are normal. No
adenopathy. No bone lesions.
IMPRESSION: Tube and catheter positions as described without pneumothorax.
Consolidation left base with more patchy airspace opacity in each
lower lung region, similar to earlier in the day. Stable cardiac
silhouette.

## 2021-10-16 IMAGING — US US RENAL
1 series · 14 of 25 positions shown · non-contrast
Comparison: CT abdomen pelvis 03/08/2020. ultrasound renal
02/28/2020

CLINICAL DATA: Acute renal failure, hypertension, diabetes

EXAM:
RENAL / URINARY TRACT ULTRASOUND COMPLETE

[Series 1: us renal · 14 of 31 slices shown]
[im 1/31]
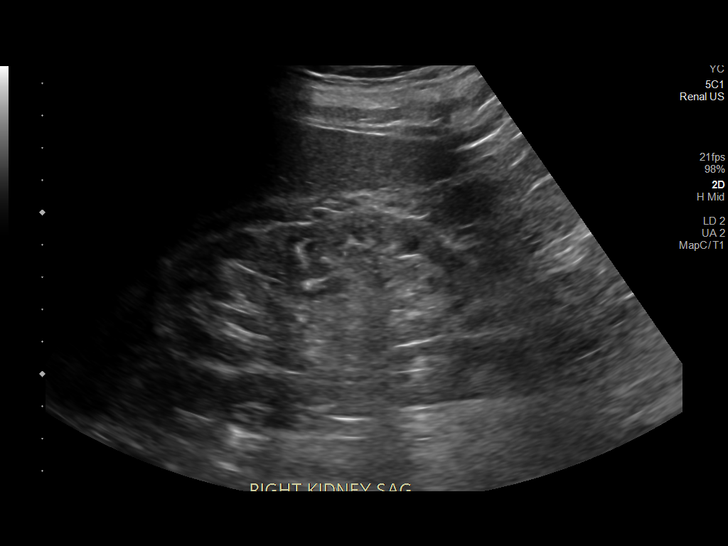
[im 3/31]
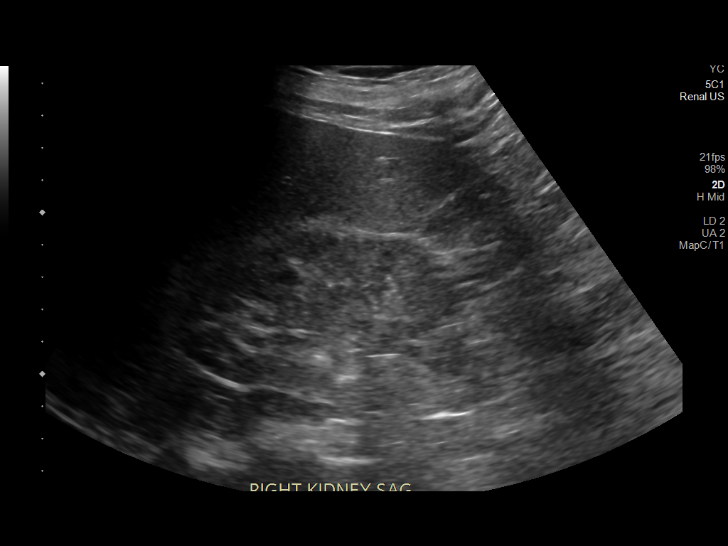
[im 6/31]
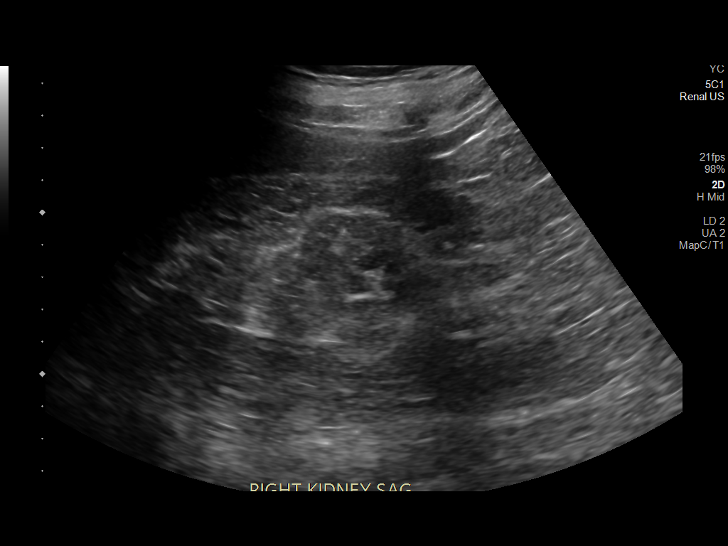
[im 8/31]
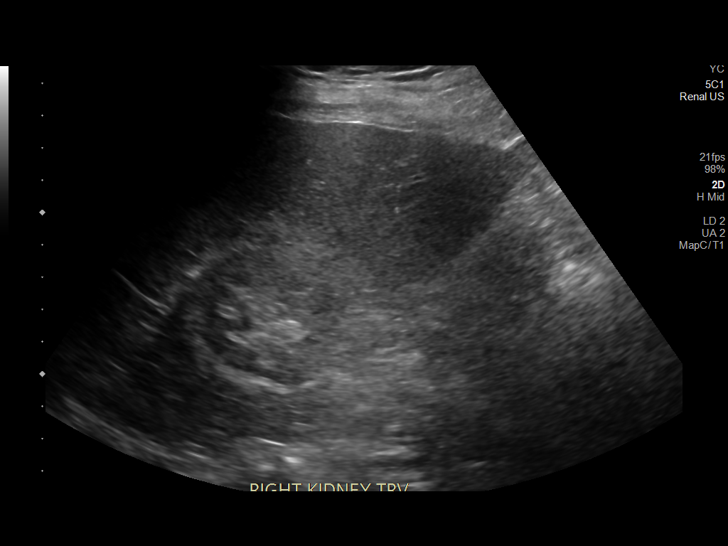
[im 11/31]
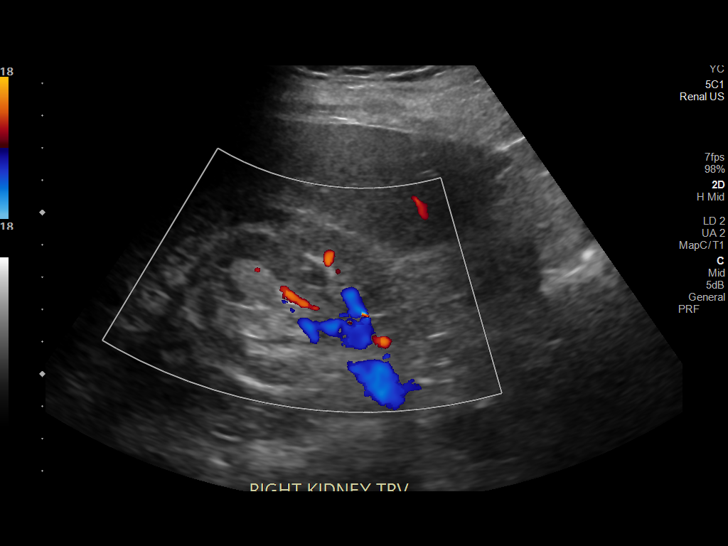
[im 12/31]
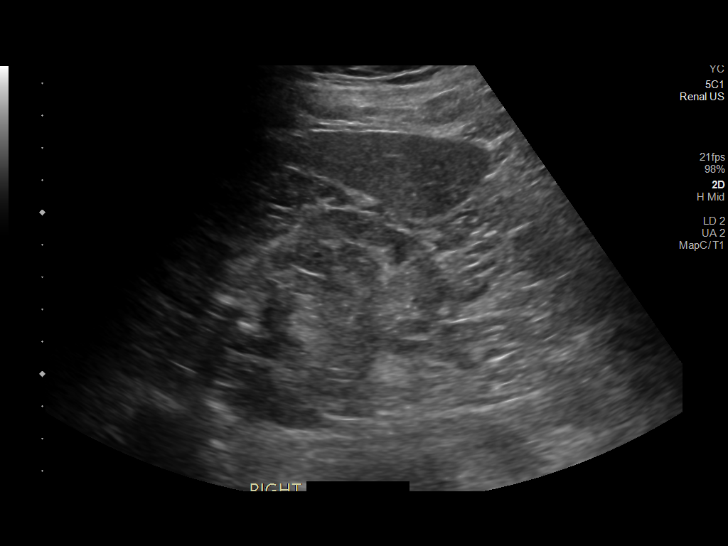
[im 14/31]
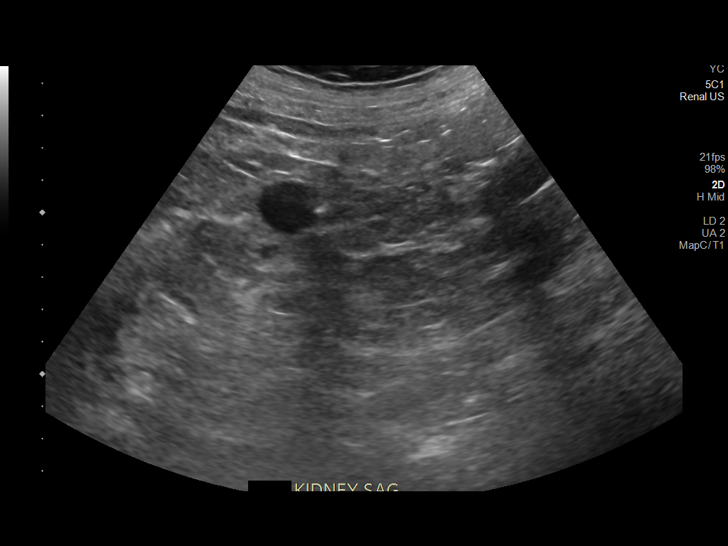
[im 17/31]
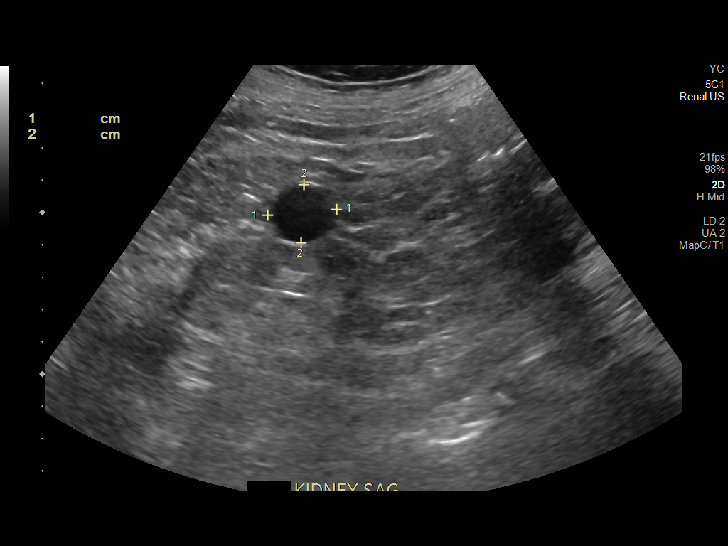
[im 19/31]
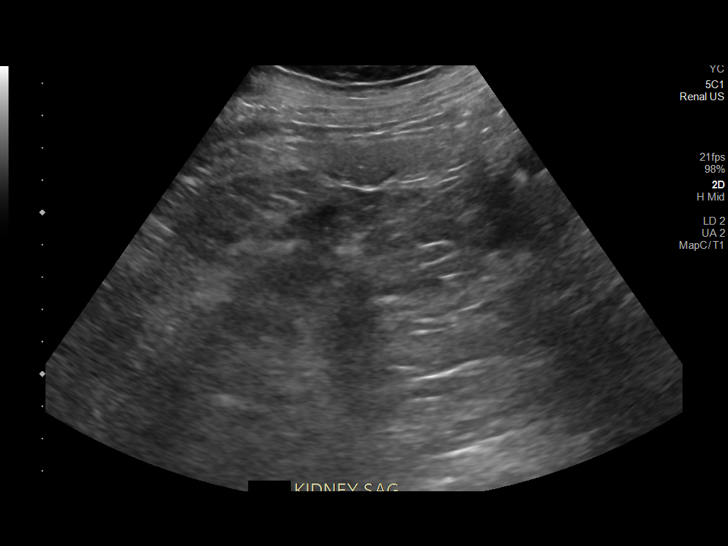
[im 21/31]
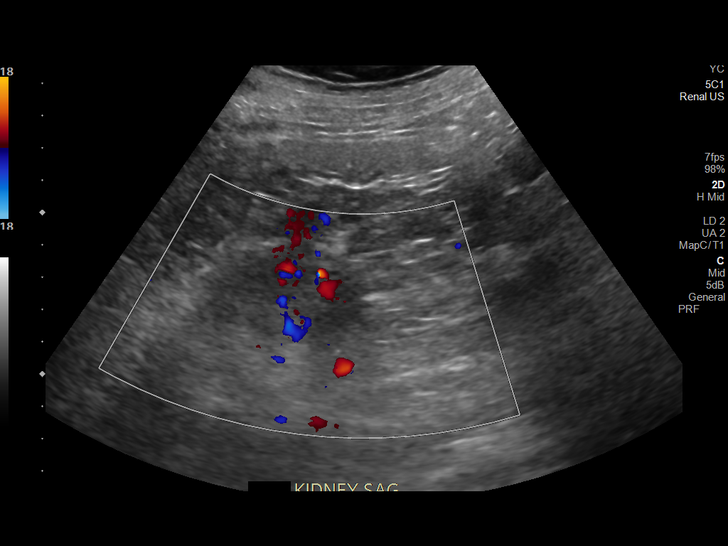
[im 23/31]
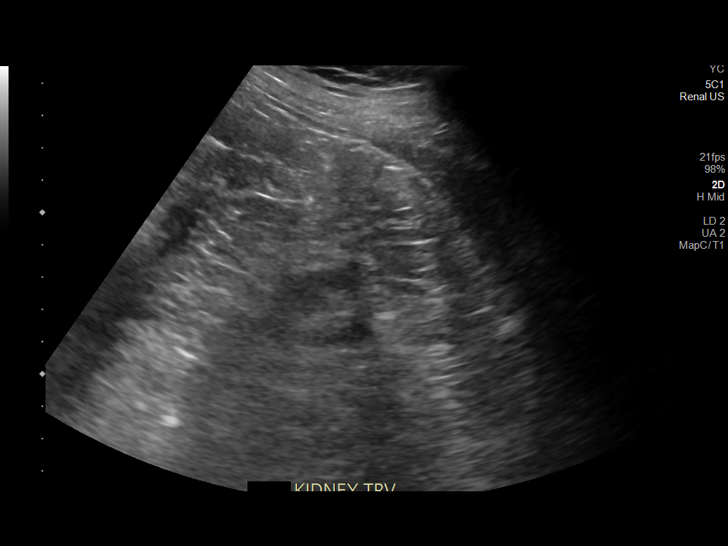
[im 26/31]
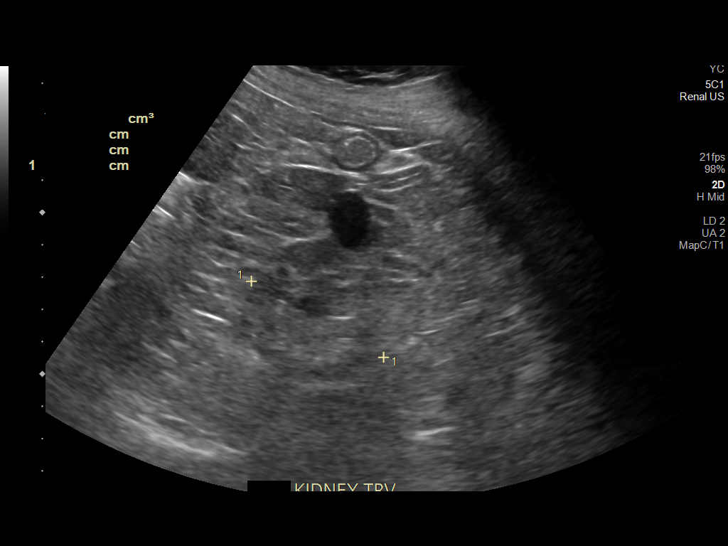
[im 28/31]
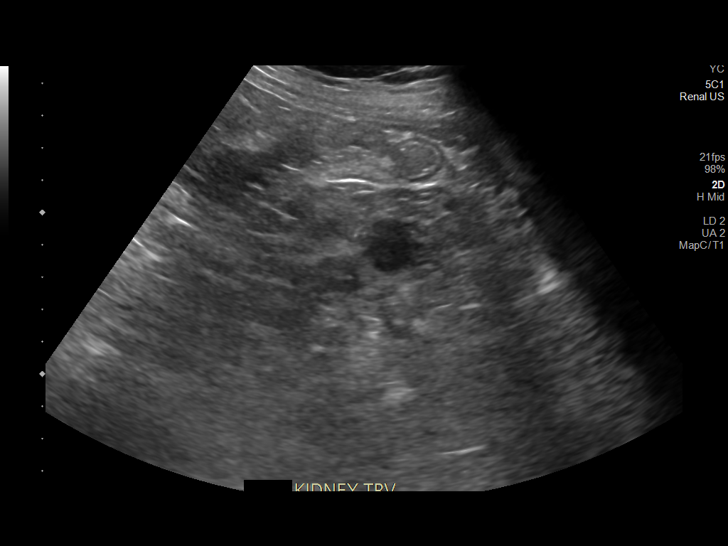
[im 31/31]
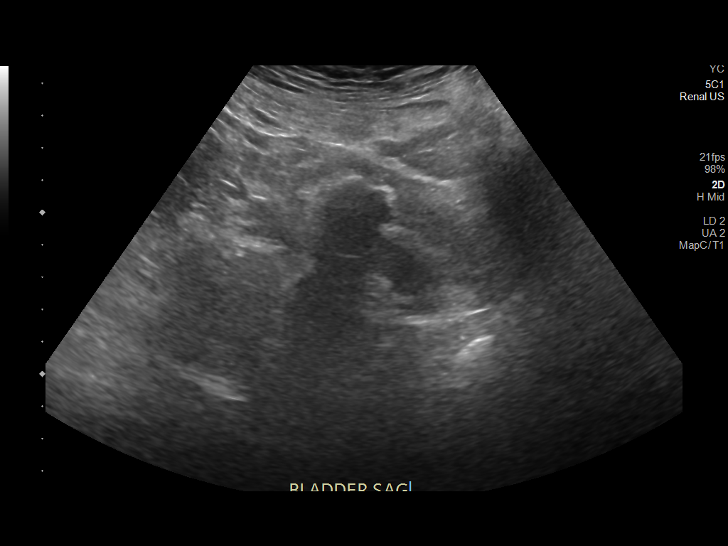

[14 of 25 positions shown; findings below may reference images not displayed]

FINDINGS: Right Kidney:

Renal measurements: 9.7 x 4.6 x 5.6 = volume: 124 mL. Increased
parenchymal echogenicity. No mass or hydronephrosis visualized.

Left Kidney:

Renal measurements: 9 x 4.9 x 4.7 = volume: 109 mL. Increased
parenchymal echogenicity. There is a stable 2.1 x 1.8 x 1.7 cm
anechoic lesion within the left kidney that is consistent with a
simple renal cyst. No solid mass or hydronephrosis visualized.

Urinary bladder:

The urinary bladder is decompressed with a Foley catheter balloon
identified within its lumen.

Other:

None.
IMPRESSION: 1. Atrophic kidneys with increased renal echogenicity bilaterally
suggestive of chronic renal parenchymal disease.
2. Otherwise unremarkable renal ultrasound with urinary bladder
Foley.

## 2021-10-17 IMAGING — DX DG CHEST 1V PORT
1 series · 1 of 1 positions shown · non-contrast
Comparison: March 08, 2020

CLINICAL DATA: Hypoxia.

EXAM:
PORTABLE CHEST 1 VIEW

[chest ap]
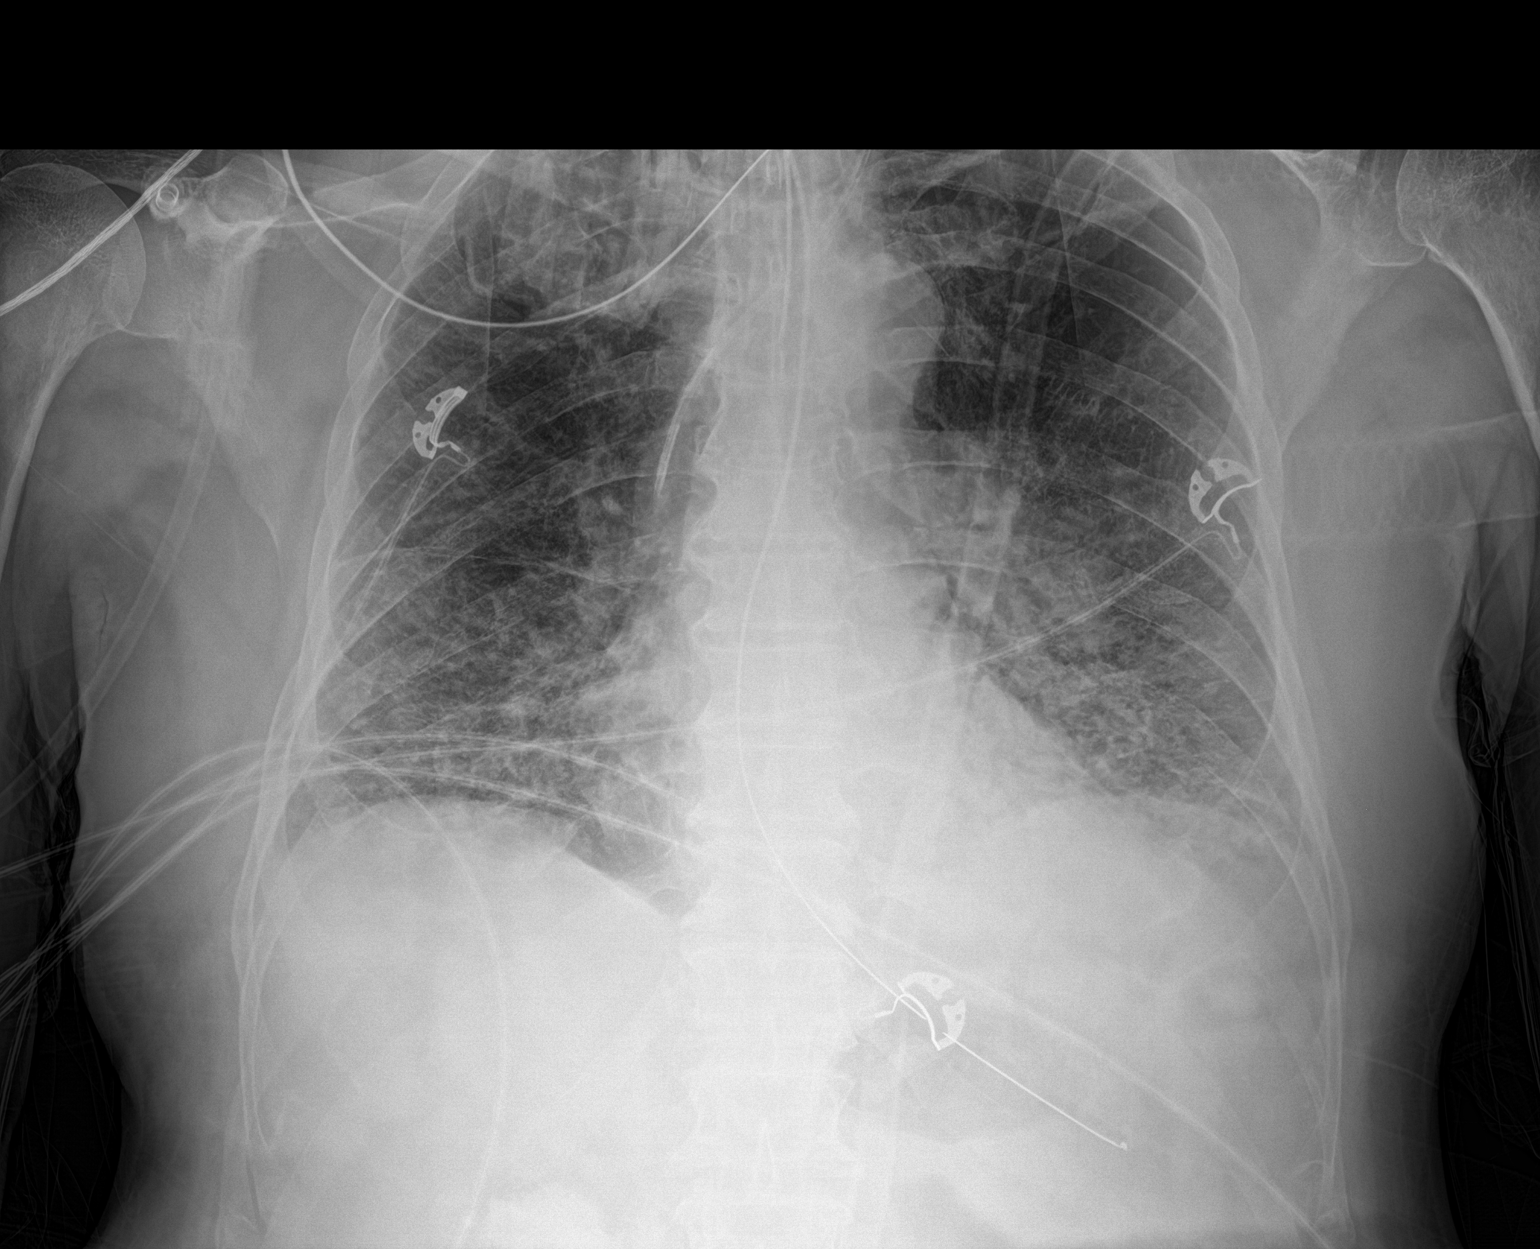

[1 of 1 positions shown; findings below may reference images not displayed]

FINDINGS: There is stable endotracheal tube and left internal jugular venous
catheter positioning. Interval nasogastric tube placement is noted
with its distal tip overlying the body of the stomach. Mild, stable
bibasilar atelectasis and/or infiltrate is noted. There is no
evidence of a pleural effusion or pneumothorax. The heart size and
mediastinal contours are within normal limits. Degenerative changes
seen within the thoracic spine.
IMPRESSION: 1. Mild, stable bibasilar atelectasis and/or infiltrate.
2. Interval nasogastric tube placement positioning, as described
above.
